# Patient Record
Sex: Male | Born: 1935
Health system: Southern US, Community
[De-identification: ages and names within clinical notes are randomized; demographics above are authoritative.]

## PROBLEM LIST (undated history)

## (undated) DIAGNOSIS — F5104 Psychophysiologic insomnia: Secondary | ICD-10-CM

## (undated) DIAGNOSIS — K219 Gastro-esophageal reflux disease without esophagitis: Secondary | ICD-10-CM

## (undated) DIAGNOSIS — I495 Sick sinus syndrome: Secondary | ICD-10-CM

## (undated) DIAGNOSIS — I10 Essential (primary) hypertension: Secondary | ICD-10-CM

## (undated) DIAGNOSIS — S2249XA Multiple fractures of ribs, unspecified side, initial encounter for closed fracture: Secondary | ICD-10-CM

## (undated) DIAGNOSIS — I2510 Atherosclerotic heart disease of native coronary artery without angina pectoris: Secondary | ICD-10-CM

## (undated) DIAGNOSIS — A078 Other specified protozoal intestinal diseases: Secondary | ICD-10-CM

## (undated) DIAGNOSIS — N281 Cyst of kidney, acquired: Secondary | ICD-10-CM

## (undated) DIAGNOSIS — I447 Left bundle-branch block, unspecified: Secondary | ICD-10-CM

## (undated) DIAGNOSIS — C4490 Unspecified malignant neoplasm of skin, unspecified: Secondary | ICD-10-CM

## (undated) DIAGNOSIS — K5792 Diverticulitis of intestine, part unspecified, without perforation or abscess without bleeding: Secondary | ICD-10-CM

## (undated) DIAGNOSIS — Z8679 Personal history of other diseases of the circulatory system: Secondary | ICD-10-CM

## (undated) DIAGNOSIS — E7800 Pure hypercholesterolemia, unspecified: Secondary | ICD-10-CM

## (undated) DIAGNOSIS — N200 Calculus of kidney: Secondary | ICD-10-CM

## (undated) DIAGNOSIS — N529 Male erectile dysfunction, unspecified: Secondary | ICD-10-CM

## (undated) HISTORY — DX: Gastro-esophageal reflux disease without esophagitis: K21.9

## (undated) HISTORY — DX: Male erectile dysfunction, unspecified: N52.9

## (undated) HISTORY — PX: STONE EXTRACTION WITH BASKET: SHX5318

## (undated) HISTORY — DX: Pure hypercholesterolemia, unspecified: E78.00

## (undated) HISTORY — DX: Atherosclerotic heart disease of native coronary artery without angina pectoris: I25.10

## (undated) HISTORY — DX: Multiple fractures of ribs, unspecified side, initial encounter for closed fracture: S22.49XA

## (undated) HISTORY — DX: Left bundle-branch block, unspecified: I44.7

## (undated) HISTORY — DX: Calculus of kidney: N20.0

## (undated) HISTORY — DX: Personal history of other diseases of the circulatory system: Z86.79

## (undated) HISTORY — DX: Cyst of kidney, acquired: N28.1

## (undated) HISTORY — DX: Diverticulitis of intestine, part unspecified, without perforation or abscess without bleeding: K57.92

## (undated) HISTORY — DX: Essential (primary) hypertension: I10

## (undated) HISTORY — DX: Other specified protozoal intestinal diseases: A07.8

## (undated) HISTORY — DX: Psychophysiologic insomnia: F51.04

## (undated) HISTORY — DX: Sick sinus syndrome: I49.5

## (undated) HISTORY — DX: Unspecified malignant neoplasm of skin, unspecified: C44.90

## (undated) HISTORY — PX: PROSTATE BIOPSY: SHX241

---

## 2000-07-25 HISTORY — PX: CORONARY ANGIOPLASTY WITH STENT PLACEMENT: SHX49

## 2000-08-11 ENCOUNTER — Inpatient Hospital Stay (HOSPITAL_COMMUNITY): Admission: EM | Admit: 2000-08-11 | Discharge: 2000-08-13 | Payer: Self-pay | Admitting: Emergency Medicine

## 2000-08-11 ENCOUNTER — Encounter: Payer: Self-pay | Admitting: Emergency Medicine

## 2000-08-15 ENCOUNTER — Encounter (HOSPITAL_COMMUNITY): Admission: RE | Admit: 2000-08-15 | Discharge: 2000-11-13 | Payer: Self-pay | Admitting: Interventional Cardiology

## 2000-10-31 ENCOUNTER — Encounter: Payer: Self-pay | Admitting: Gastroenterology

## 2000-10-31 ENCOUNTER — Encounter: Admission: RE | Admit: 2000-10-31 | Discharge: 2000-10-31 | Payer: Self-pay | Admitting: Gastroenterology

## 2000-11-14 ENCOUNTER — Encounter (HOSPITAL_COMMUNITY): Admission: RE | Admit: 2000-11-14 | Discharge: 2000-11-22 | Payer: Self-pay | Admitting: Interventional Cardiology

## 2000-11-16 ENCOUNTER — Ambulatory Visit (HOSPITAL_COMMUNITY): Admission: RE | Admit: 2000-11-16 | Discharge: 2000-11-16 | Payer: Self-pay | Admitting: Gastroenterology

## 2001-02-01 ENCOUNTER — Ambulatory Visit (HOSPITAL_COMMUNITY): Admission: RE | Admit: 2001-02-01 | Discharge: 2001-02-01 | Payer: Self-pay | Admitting: Interventional Cardiology

## 2003-10-22 ENCOUNTER — Encounter: Admission: RE | Admit: 2003-10-22 | Discharge: 2003-10-22 | Payer: Self-pay | Admitting: Internal Medicine

## 2003-12-11 ENCOUNTER — Ambulatory Visit (HOSPITAL_COMMUNITY): Admission: RE | Admit: 2003-12-11 | Discharge: 2003-12-11 | Payer: Self-pay | Admitting: Gastroenterology

## 2006-04-13 ENCOUNTER — Inpatient Hospital Stay (HOSPITAL_BASED_OUTPATIENT_CLINIC_OR_DEPARTMENT_OTHER): Admission: RE | Admit: 2006-04-13 | Discharge: 2006-04-13 | Payer: Self-pay | Admitting: Interventional Cardiology

## 2006-04-14 ENCOUNTER — Ambulatory Visit (HOSPITAL_COMMUNITY): Admission: RE | Admit: 2006-04-14 | Discharge: 2006-04-15 | Payer: Self-pay | Admitting: Interventional Cardiology

## 2006-04-14 HISTORY — PX: CORONARY ANGIOPLASTY WITH STENT PLACEMENT: SHX49

## 2006-12-14 ENCOUNTER — Encounter: Admission: RE | Admit: 2006-12-14 | Discharge: 2006-12-14 | Payer: Self-pay | Admitting: Internal Medicine

## 2010-09-22 ENCOUNTER — Inpatient Hospital Stay (HOSPITAL_COMMUNITY): Admission: EM | Admit: 2010-09-22 | Discharge: 2010-09-30 | Payer: Self-pay | Source: Home / Self Care

## 2010-09-24 ENCOUNTER — Encounter (INDEPENDENT_AMBULATORY_CARE_PROVIDER_SITE_OTHER): Payer: Self-pay | Admitting: Interventional Cardiology

## 2010-09-25 DIAGNOSIS — S2249XA Multiple fractures of ribs, unspecified side, initial encounter for closed fracture: Secondary | ICD-10-CM

## 2010-09-25 HISTORY — PX: OTHER SURGICAL HISTORY: SHX169

## 2010-09-25 HISTORY — DX: Multiple fractures of ribs, unspecified side, initial encounter for closed fracture: S22.49XA

## 2010-10-06 ENCOUNTER — Ambulatory Visit: Payer: Self-pay | Admitting: Thoracic Surgery

## 2010-10-06 ENCOUNTER — Encounter
Admission: RE | Admit: 2010-10-06 | Discharge: 2010-10-06 | Payer: Self-pay | Source: Home / Self Care | Attending: Thoracic Surgery | Admitting: Thoracic Surgery

## 2010-10-20 ENCOUNTER — Ambulatory Visit
Admission: RE | Admit: 2010-10-20 | Discharge: 2010-10-20 | Payer: Self-pay | Source: Home / Self Care | Attending: Thoracic Surgery | Admitting: Thoracic Surgery

## 2010-10-20 ENCOUNTER — Encounter
Admission: RE | Admit: 2010-10-20 | Discharge: 2010-10-20 | Payer: Self-pay | Source: Home / Self Care | Attending: Thoracic Surgery | Admitting: Thoracic Surgery

## 2010-12-20 ENCOUNTER — Other Ambulatory Visit: Payer: Self-pay | Admitting: Thoracic Surgery

## 2010-12-20 DIAGNOSIS — S2249XA Multiple fractures of ribs, unspecified side, initial encounter for closed fracture: Secondary | ICD-10-CM

## 2010-12-21 ENCOUNTER — Ambulatory Visit
Admission: RE | Admit: 2010-12-21 | Discharge: 2010-12-21 | Disposition: A | Discharge status: Home / Self Care | Payer: Medicare Other | Source: Ambulatory Visit | Attending: Thoracic Surgery | Admitting: Thoracic Surgery

## 2010-12-21 ENCOUNTER — Ambulatory Visit (INDEPENDENT_AMBULATORY_CARE_PROVIDER_SITE_OTHER): Payer: Self-pay | Admitting: Thoracic Surgery

## 2010-12-21 DIAGNOSIS — S2249XA Multiple fractures of ribs, unspecified side, initial encounter for closed fracture: Secondary | ICD-10-CM

## 2010-12-22 NOTE — Assessment & Plan Note (Signed)
OFFICE VISIT  AMOS, MICHEALS DOB:  22-Aug-1926                                        December 21, 2010 CHART #:  [id number]  The patient came today and has continued to make some improvement.  He still is hurting some on the right side but his chest x-ray shows that the rib plates are in good position and he has good chest wall stability.  His lungs are clear to auscultation and percussion.  I told him to gradually increase his activities.  We will check him again in 3 months with chest x-ray.  Ines Bloomer, M.D. Electronically Signed  DPB/MEDQ  D:  12/21/2010  T:  12/21/2010  Job:  [id number]

## 2010-12-27 LAB — BASIC METABOLIC PANEL
BUN: 17 mg/dL (ref 6–23)
CO2: 22 mEq/L (ref 19–32)
CO2: 26 mEq/L (ref 19–32)
CO2: 27 mEq/L (ref 19–32)
Calcium: 8.3 mg/dL — ABNORMAL LOW (ref 8.4–10.5)
Chloride: 106 mEq/L (ref 96–112)
Chloride: 98 mEq/L (ref 96–112)
Chloride: 99 mEq/L (ref 96–112)
Chloride: 99 mEq/L (ref 96–112)
Creatinine, Ser: 0.88 mg/dL (ref 0.4–1.5)
Creatinine, Ser: 1.1 mg/dL (ref 0.4–1.5)
GFR calc Af Amer: >60 mL/min (ref >60)
GFR calc Af Amer: >60 mL/min (ref >60)
GFR calc Af Amer: >60 mL/min (ref >60)
Glucose, Bld: 119 mg/dL — ABNORMAL HIGH (ref 70–99)
Glucose, Bld: 119 mg/dL — ABNORMAL HIGH (ref 70–99)
Glucose, Bld: 126 mg/dL — ABNORMAL HIGH (ref 70–99)
Potassium: 3.7 mEq/L (ref 3.5–5.1)
Potassium: 4 mEq/L (ref 3.5–5.1)
Sodium: 130 mEq/L — ABNORMAL LOW (ref 135–145)

## 2010-12-27 LAB — CBC
HCT: 25.7 % — ABNORMAL LOW (ref 39.0–52.0)
HCT: 25.9 % — ABNORMAL LOW (ref 39.0–52.0)
HCT: 26 % — ABNORMAL LOW (ref 39.0–52.0)
HCT: 27.8 % — ABNORMAL LOW (ref 39.0–52.0)
HCT: 30.2 % — ABNORMAL LOW (ref 39.0–52.0)
HCT: 34.4 % — ABNORMAL LOW (ref 39.0–52.0)
HCT: 39.6 % (ref 39.0–52.0)
Hemoglobin: 10 g/dL — ABNORMAL LOW (ref 13.0–17.0)
Hemoglobin: 11.2 g/dL — ABNORMAL LOW (ref 13.0–17.0)
Hemoglobin: 8.2 g/dL — ABNORMAL LOW (ref 13.0–17.0)
Hemoglobin: 9 g/dL — ABNORMAL LOW (ref 13.0–17.0)
MCH: 31 pg (ref 26.0–34.0)
MCH: 31 pg (ref 26.0–34.0)
MCH: 31.1 pg (ref 26.0–34.0)
MCH: 31.3 pg (ref 26.0–34.0)
MCH: 31.4 pg (ref 26.0–34.0)
MCH: 31.9 pg (ref 26.0–34.0)
MCHC: 32.4 g/dL (ref 30.0–36.0)
MCHC: 32.6 g/dL (ref 30.0–36.0)
MCHC: 33.1 g/dL (ref 30.0–36.0)
MCHC: 33.3 g/dL (ref 30.0–36.0)
MCHC: 33.8 g/dL (ref 30.0–36.0)
MCHC: 34.5 g/dL (ref 30.0–36.0)
MCV: 89.7 fL (ref 78.0–100.0)
MCV: 90 fL (ref 78.0–100.0)
MCV: 91.1 fL (ref 78.0–100.0)
MCV: 91.2 fL (ref 78.0–100.0)
MCV: 91.9 fL (ref 78.0–100.0)
MCV: 93 fL (ref 78.0–100.0)
MCV: 93.4 fL (ref 78.0–100.0)
MCV: 94.4 fL (ref 78.0–100.0)
MCV: 95.8 fL (ref 78.0–100.0)
Platelets: 142 10*3/uL — ABNORMAL LOW (ref 150–400)
Platelets: 147 10*3/uL — ABNORMAL LOW (ref 150–400)
Platelets: 175 10*3/uL (ref 150–400)
Platelets: 188 10*3/uL (ref 150–400)
Platelets: 292 10*3/uL (ref 150–400)
RBC: 2.61 MIL/uL — ABNORMAL LOW (ref 4.22–5.81)
RBC: 2.61 MIL/uL — ABNORMAL LOW (ref 4.22–5.81)
RBC: 2.82 MIL/uL — ABNORMAL LOW (ref 4.22–5.81)
RBC: 2.83 MIL/uL — ABNORMAL LOW (ref 4.22–5.81)
RBC: 3.2 MIL/uL — ABNORMAL LOW (ref 4.22–5.81)
RBC: 3.58 MIL/uL — ABNORMAL LOW (ref 4.22–5.81)
RBC: 3.63 MIL/uL — ABNORMAL LOW (ref 4.22–5.81)
RDW: 12.9 % (ref 11.5–15.5)
RDW: 13.1 % (ref 11.5–15.5)
RDW: 13.1 % (ref 11.5–15.5)
WBC: 11.1 10*3/uL — ABNORMAL HIGH (ref 4.0–10.5)
WBC: 12.6 10*3/uL — ABNORMAL HIGH (ref 4.0–10.5)
WBC: 14.8 10*3/uL — ABNORMAL HIGH (ref 4.0–10.5)
WBC: 17 10*3/uL — ABNORMAL HIGH (ref 4.0–10.5)

## 2010-12-27 LAB — COMPREHENSIVE METABOLIC PANEL
ALT: 150 U/L — ABNORMAL HIGH (ref 0–53)
ALT: 209 U/L — ABNORMAL HIGH (ref 0–53)
AST: 180 U/L — ABNORMAL HIGH (ref 0–37)
AST: 291 U/L — ABNORMAL HIGH (ref 0–37)
Albumin: 2.6 g/dL — ABNORMAL LOW (ref 3.5–5.2)
Albumin: 2.7 g/dL — ABNORMAL LOW (ref 3.5–5.2)
Albumin: 3.7 g/dL (ref 3.5–5.2)
Alkaline Phosphatase: 23 U/L — ABNORMAL LOW (ref 39–117)
BUN: 13 mg/dL (ref 6–23)
BUN: 18 mg/dL (ref 6–23)
CO2: 24 mEq/L (ref 19–32)
CO2: 24 mEq/L (ref 19–32)
CO2: 29 mEq/L (ref 19–32)
Calcium: 8 mg/dL — ABNORMAL LOW (ref 8.4–10.5)
Calcium: 8.9 mg/dL (ref 8.4–10.5)
Chloride: 107 mEq/L (ref 96–112)
Chloride: 111 mEq/L (ref 96–112)
Chloride: 96 mEq/L (ref 96–112)
Creatinine, Ser: 0.89 mg/dL (ref 0.4–1.5)
Creatinine, Ser: 1.29 mg/dL (ref 0.4–1.5)
GFR calc Af Amer: 56 mL/min — ABNORMAL LOW (ref >60)
GFR calc Af Amer: >60 mL/min (ref >60)
GFR calc non Af Amer: 46 mL/min — ABNORMAL LOW (ref >60)
GFR calc non Af Amer: 53 mL/min — ABNORMAL LOW (ref >60)
GFR calc non Af Amer: >60 mL/min (ref >60)
Glucose, Bld: 101 mg/dL — ABNORMAL HIGH (ref 70–99)
Glucose, Bld: 117 mg/dL — ABNORMAL HIGH (ref 70–99)
Potassium: 4.2 mEq/L (ref 3.5–5.1)
Potassium: 5.2 mEq/L — ABNORMAL HIGH (ref 3.5–5.1)
Sodium: 141 mEq/L (ref 135–145)
Sodium: 141 mEq/L (ref 135–145)
Total Bilirubin: 0.6 mg/dL (ref 0.3–1.2)
Total Bilirubin: 0.7 mg/dL (ref 0.3–1.2)
Total Bilirubin: 1.2 mg/dL (ref 0.3–1.2)
Total Protein: 4.6 g/dL — ABNORMAL LOW (ref 6.0–8.3)

## 2010-12-27 LAB — URINE CULTURE: Culture  Setup Time: 201112120905

## 2010-12-27 LAB — AMYLASE: Amylase: 47 U/L (ref 0–105)

## 2010-12-27 LAB — TYPE AND SCREEN
ABO/RH(D): O POS
Antibody Screen: NEGATIVE
Unit division: 0

## 2010-12-27 LAB — POCT I-STAT, CHEM 8
BUN: 20 mg/dL (ref 6–23)
Chloride: 107 mEq/L (ref 96–112)
Creatinine, Ser: 1.4 mg/dL (ref 0.4–1.5)
Sodium: 141 mEq/L (ref 135–145)
TCO2: 24 mmol/L (ref 0–100)

## 2010-12-27 LAB — BLOOD GAS, ARTERIAL
Acid-Base Excess: 2.9 mmol/L — ABNORMAL HIGH (ref 0.0–2.0)
Bicarbonate: 26.4 mEq/L — ABNORMAL HIGH (ref 20.0–24.0)
Drawn by: 252031
O2 Saturation: 93.8 %
pO2, Arterial: 66.2 mmHg — ABNORMAL LOW (ref 80.0–100.0)

## 2010-12-27 LAB — CARDIAC PANEL(CRET KIN+CKTOT+MB+TROPI): Relative Index: 1.6 (ref 0.0–2.5)

## 2010-12-27 LAB — DIFFERENTIAL
Eosinophils Relative: 0 % (ref 0–5)
Lymphocytes Relative: 6 % — ABNORMAL LOW (ref 12–46)
Lymphs Abs: 0.8 10*3/uL (ref 0.7–4.0)
Monocytes Absolute: 0.9 10*3/uL (ref 0.1–1.0)
Monocytes Relative: 7 % (ref 3–12)

## 2010-12-27 LAB — MRSA PCR SCREENING
MRSA by PCR: NEGATIVE
MRSA by PCR: NEGATIVE

## 2010-12-27 LAB — PREPARE RBC (CROSSMATCH)

## 2011-03-01 NOTE — Letter (Signed)
October 20, 2010   Lyn Records, M.D.  [address]  [address]  [place] [address]   Re:  MARSHALL, KAMPF             DOB:  [birth year]   Dear Erskine Emery,   I saw the patient back today and his incisions are well healed and his x-  ray looks that his rib plating was in good position and no evidence of  recurrence of his hemothorax.  He will be seeing you today.  His blood  pressure was 159/84, pulse 79, respirations 20, and sats were 97%.  He  said that his blood pressure has been up recently and I said this  probably were just related that he is getting better and that you would  probably adjust his medications.  I gave him a refill for Ultram and I  will see him back again in 2 months with another chest x-ray and I have  released him to full activity.   Sincerely,   Ines Bloomer, M.D.  Electronically Signed   DPB/MEDQ  D:  10/20/2010  T:  10/21/2010  Job:  [id number]   cc:   Cherylynn Ridges, M.D.  Thora Lance, M.D.

## 2011-03-01 NOTE — Letter (Signed)
October 06, 2010   Gabrielle Dare. Janee Morn, MD  [facility]  [address] [place], [place] [address]   Re:  ARIEN, BENINCASA             DOB:  07-Jul-1926   Dear Laurell Josephs,   I saw the patient back today.  He is doing well after his open reduction  internal fixation of his fourth through seventh ribs.  His incision has  well-healed.  I removed his chest tube sutures.  His lungs are clear.  He is having some moderate pain, but this has improved and his shortness  of breath has improved.  His blood pressure is 108/63, pulse 62,  respirations 16, sats were 94%.   PLAN:  To see him back in 2 weeks.  I told him not to drive until we saw  him back in 2 weeks.   Ines Bloomer, M.D.  Electronically Signed   DPB/MEDQ  D:  10/06/2010  T:  10/06/2010  Job:  [id number]

## 2011-03-04 NOTE — Cardiovascular Report (Signed)
[place]  Patient:    Brandon Robles, Brandon Robles                            MRN: [id number] Proc. Date: 02/01/01 Adm. Date:  [id number] Disc. Date: [id number] Attending:  Orland Mustard CC:         Pearla Dubonnet, M.D.                        Cardiac Catheterization  INDICATIONS FOR PROCEDURE:  Abnormal Cardiolite, recent atypical chest symptoms including left arm discomfort in this patient who is status post acute inferior infarction with stenting of the right coronary in October of 2001.  This study is being done to rule out in-stent re-stenosis.  PROCEDURES PERFORMED: 1. Left heart catheterization. 2. Selective coronary angiography. 3. Left ventriculography.  DESCRIPTION OF PROCEDURE:  After informed consent, a 6 French sheath was inserted into the right femoral artery using a modified Seldinger technique. A 6 French A2 multipurpose catheter was used for hemodynamic recordings, left ventriculography, and selective left and right coronary angiography.  The patient tolerated the procedure without complications.  A sheathogram was performed in the right femoral/iliac system and demonstrated sheath entry near the bifurcation and therefore Perclose was not performed.  No complications occurred during the procedure.  RESULTS:  I:  HEMODYNAMIC DATA:     a. The aortic pressure was 125/61 mmHg.     b. Left ventricular pressure 123/13 mmHg.  II:  LEFT VENTRICULOGRAPHY:  The left ventricle is normal in size.  There appears to be mild anteroapical hypokinesis.  The ejection fraction is approximately 50%.  III:  SELECTIVE CORONARY ANGIOGRAPHY/FLUOROSCOPY:  Demonstrates spotty, heavy left anterior descending calcification as well as right coronary proximal calcification.     a. Left main:  The left main is free of significant obstruction.     b. Left anterior descending coronary artery:  The LAD is a large        vessel that wraps around the left  ventricular apex.  It gives        origin to three diagonal branches.  Heavy calcium is noted        proximally in the mid vessel.  There is an eccentric, somewhat        shelflike lesion in the proximal LAD before the first large        septal perforator.  This obstructs the vessel by up to 40%.  Luminal        irregularities are noted in the mid LAD beyond the second diagonal.        The LAD wraps around the apex and is free of other evidence of        obstruction.     c. Circumflex artery:  The circumflex gives origin to a small to moderate        sized ramus branch.  The ramus branch in its mid section contains an        eccentric 40-50% stenosis.     d. Right coronary artery:  The right coronary artery is a large vessel.        Cine fluoroscopy demonstrates the stent in the mid vessel.  Luminal        irregularities with up to 30% distal in-stent re-stenosis is noted.        No significant obstruction is noted.  The right coronary contains a  30% eccentric plaque beyond the first branch that arises on the        inferior wall.  A large PDA and two moderate sized left ventricular        branches arise distally and are free of any significant obstruction.  CONCLUSIONS: 1. Mild to moderate coronary disease with 40% eccentric left anterior    descending, 50% ramus branch, and 30% distal in-stent re-stenosis    in the mid right coronary. 2. Mild anteroapical hypokinesis with ejection fraction of 50%. 3. Abnormal Cardiolite demonstrating evidence of inferior infarction with    peri-infarct ischemia.  This will probably be the appearance of    the patients Cardiolite study.  There are certainly no obstructive    lesion to account for any evidence of ischemia.  The findings likely    represent residual infarcted tissue.  PLAN:  Continue medical therapy. DD:  02/01/01 TD:  02/02/01 Job: [id number]

## 2011-03-04 NOTE — Discharge Summary (Signed)
[place]  Patient:    Brandon Robles, [name]                            MRN: [id number] Adm. Date:  [id number] Disc. Date: 08/13/00 Attending:  Rudean Hitt CC:         Celso Sickle, M.D.  [place] Phase 2  Cardiac Rehabilitation Program   Discharge Summary  FINAL DIAGNOSES: 1. Chest and epigastric pain, myocardial infarction ruled out. 2. Gastroenteritis, resolved. 3. Recent inferior wall myocardial infarction on July 25, 2000, with persistent electrocardiographic changes. 4. Labile hypertension. 5. Anemia. 6. Chronic anxiety and sleep disturbance.  HISTORY OF PRESENT ILLNESS:  This is a 77 year old Caucasian gentleman admitted with chest discomfort.  He had been hospitalized on July 25, 2000, in [place] for an acute inferior wall myocardial infarction.  He was air lifted from [place], [place], to [place], [place], where he underwent acute stent to his right coronary artery.  He was discharged home two days later on Plavix 75 mg daily, atenolol 50 mg daily, Ambien 10 mg h.s., and Ecotrin 325 mg a day.  He was to have started cardiac rehabilitation on August 16, 2000.  The patient was admitted on August 11, 2000, to [place] after experiencing of severe indigestion and subsequent diarrhea.  The pain was epigastric rather than truly precordial.  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  An EKG showed inferior ischemia T-wave changes and unfortunately no old EKGs were available to compare.  The patient denied any nausea or diaphoresis associated with the diarrhea.  PHYSICAL EXAMINATION:  The physical examination on admission showed a blood pressure of 123/59, a pulse of 60 and regular, and normal respirations.  HEENT:  Unremarkable.  NECK:  Jugular venous pressure normal.  Carotids normal.  CHEST:  Clear.  HEART:  No murmurs,  rubs, gallops, or clicks.  ABDOMEN:  Soft, nontender, and no masses.  Active bowel sounds.  EXTREMITIES:  Good pulses.  No edema.  HOSPITAL COURSE:  His EKG in the emergency room showed Q waves in 2, 3, and aVF with deeply inverted T-waves in 2, 3, and aVF consistent with an inferior wall myocardial infarction of uncertain age.  The chest x-ray was negative with no active disease.  The patient was admitted to telemetry.  Serial enzymes and EKGs were obtained.  The patient was initially treated with IV heparin and IV nitroglycerin and his Plavix was continued.  His cardiac enzymes came back negative x 3.  His initial hemoglobin was 15 and the following morning was 13.  Stool Hemoccults were requested, but are not reported back at the time of this dictation.  The hemoglobin remained stable at 13 on the second morning of his hospital stay.  His GI symptoms improved nicely on Prevacid.  Serial EKGs showed no change in the pattern of a recent inferior wall myocardial infarction with marked T-wave abnormalities.  His IV nitroglycerin and IV heparin were discontinued on August 12, 2000.  He was allowed to walk in the hall.  He had no further symptoms of any chest pain or dyspnea and was able to be discharged improved on August 13, 2000, a.m.  DISCHARGE MEDICATIONS: 1. Plavix 75 mg one daily. 2. Atenolol 50 mg, taking one half tablet twice a day. 3. Ambien 10 mg h.s. for sleep. 4. Coated  aspirin 325 mg daily. 5. Protonix 40 mg daily. 6. Nitrostat 1/150 p.r.n. 7. Generic Xanax 0.25 mg q.8h. p.r.n. for nerves.  DIET:  He will be on a low-cholesterol diet.  ACTIVITY:  He may drive a car and drive his tractor.  He is to proceed with starting cardiac rehabilitation later this week.  FOLLOW-UP:  He is to keep his appointment in November with Celso Sickle, M.D.  CONDITION ON DISCHARGE:  Improved. DD:  08/13/00 TD:  08/13/00 Job: [id number]

## 2011-03-04 NOTE — H&P (Signed)
[facility]  Patient:    Brandon Robles, Brandon Robles                            MRN: [id number] Adm. Date:  [id number] Disc. Date: [id number] Attending:  Orland Mustard Dictator:   Anselm Lis, N.P. CC:         Pearla Dubonnet, M.D.   History and Physical  PRIMARY CARE Lorian Yaun:  Dr. Pearla Dubonnet.  DATE OF BIRTH:  [birth year].  DATE OF PROCEDURE:  February 01, 2001.  DIAGNOSES: (As dictated by Dr. Darci Needle.) 1. Chest pain; typical/atypical features.    A. (January 11, 2001) Stress Cardiolite revealing ejection fraction of       48% with inferior infarction and peri-infarct ischemia.    B. (October 2001) Acute inferior myocardial infarction with subsequent       stent, right coronary artery, in [place], [place].    C. (Late in October 2001) [facility] admission with atypical chest       pain.  He ruled out for myocardial infarction and discomfort was thought       of a gastric etiology. 2. History of hiatal hernia diagnosed by esophagogastroduodenoscopy,    Dr. Fayrene Fearing L. Randa Evens, January 2002.  Had also undergone an abdominal    ultrasound revealing small gallbladder polyp, left liver calcified    granuloma and upper pole of the left kidney cyst. 3. History of kidney stones. 4. Labile hypertension. 5. Chronic anxiety/sleep disturbances with p.r.n. Ativan and Ambien.  PLAN:  (As dictated by Dr. Verdis Prime.)  Patient has been counseled to undergo and has accepted plans for coronary angiography with possible percutaneous intervention/brachytherapy (possible restenosis of RCA stent) if indicated and able.  Risks, potential complications, benefits and alternatives to the procedure were reviewed.  [name] and his family members indicate their questions and concerns are addressed and are agreeable to proceed.  HISTORY OF PRESENT ILLNESS:  [name] is a pleasant 77 year old gentleman with history of acute inferior myocardial  infarction, October of 2001, in [place], [place]; airlifted and had subsequent stent, RCA, in [place], [place].  Patient has been experiencing some typical/atypical features of chest pain.  A GI workup including EGD and abdominal ultrasound were significant only for hiatal hernia.  No improvement with proton pump inhibitors (Dr. Randa Evens).  Followup Cardiolite revealed evidence of an old inferior MI with peri-infarct ischemia.  He did have some ventricular ectopy (bigeminy) at peak exercise and early recovery.  Ejection fraction was 48%.  ALLERGIES:  No known drug allergies; okay with seafood, shellfish and iodinated products.  MEDICATIONS: A. Tenormin 50 mg one-half p.o. q.d. B. Xanax 0.25 mg p.r.n. up to t.i.d. C. Ambien 10 mg q.h.s. p.r.n. D. Enteric-coated aspirin 325 mg once daily (none for five days). E. Zantac p.r.n.  SOCIAL HISTORY AND HABITS:  Patient has been married for 29 years.  Has two children by a first marriage.  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 Tobacco:  Negative though did ______  until last year.  ETOH:  Negative.  FAMILY HISTORY:  Mother died at age 16 of stroke.  Father died at age 28, "hardening of the arteries."  One brother died at age 20 of MI; had hypertension.  A sister died at age 8 of myocardial infarction.  Brother died at age 20 of a heart attack; he had hypertension and diabetes.  REVIEW OF SYSTEMS:  As in HPI/previous medical history; otherwise, denies problems with fainting, light-headedness nor dizziness.  Problems with postnasal drip.  Negative melena nor bright red blood PR.  Some epigastric discomfort attributed to hiatal hernia.  Negative dysuria nor hematuria. Arthritic-type complaints affecting mostly his bilateral posterior shoulders and neck.  Denies pedal edema, orthopnea nor undue DOE.  PHYSICAL EXAMINATION:  (As performed by Dr. Verdis Prime.)  VITAL SIGNS:  Blood pressure 129/64, heart rate 48 and regular,  respiratory rate 16, temperature 96.7.  Height 5 feet 9 inches.  Weight 160 pounds.  GENERAL:  In general, he is a well-nourished, slender gentleman in no acute distress.  His family members are at bedside.  HEENT/NECK:  Brisk bilateral carotid upstrokes without bruit.  No significant JVD nor thyromegaly.  CARDIAC:  Regular rate and rhythm with distant heart sounds.  Normal S1 and S2.  No murmur, rub nor gallop appreciated.  CHEST:  Lung sounds clear with equal bilateral excursion.  Negative CVA tenderness.  ABDOMEN:  Soft, nondistended, normoactive bowel sounds.  Negative abdominal aortic, renal or femoral bruit.  Slight generalized discomfort to applied pressure.  EXTREMITIES:  Distal pulses intact; negative pedal edema.  NEUROLOGIC:  Cranial nerves II-XII grossly intact; alert and oriented x 3.  GENITAL AND RECTAL:  Deferred.  LABORATORY TESTS AND DATA:  Chest x-ray from October 2001 revealed mild aortic tortuosity, otherwise, no active disease.  Pro time 11, INR of 0.94, PTT of 26.  Sodium 142, potassium of 4.8, chloride 104, CO2 of 33, BUN of 21, creatinine 1.0, glucose 74.  Hemoglobin 15.4 with hematocrit of 44.2, WBC of 6.2 and platelets of 251,000.  LFTs within normal range.  EKG from January 11, 2001 revealed sinus bradycardia at 50 beats per minute with small Q waves inferiorly and T wave inversion, lead II. DD:  02/01/01 TD:  02/02/01 Job: [id number]

## 2011-03-04 NOTE — Cardiovascular Report (Signed)
Brandon Robles, Brandon Robles                   ACCOUNT NO.:  [id number]   MEDICAL RECORD NO.:  [id number]          PATIENT TYPE:  OIB   LOCATION:  1961                         FACILITY:  [facility]   PHYSICIAN:  Lyn Records, M.D.   DATE OF BIRTH:  20-Jan-1926   DATE OF PROCEDURE:  04/13/2006  DATE OF DISCHARGE:                              CARDIAC CATHETERIZATION   INDICATIONS:  The patient has had a previous inferior myocardial infarction  and recently had a stress Cardiolite that seemed to demonstrate the presence  of apical ischemia.  This study is being done to rule out progression of  disease in the LAD territory and possibly restenosis in right coronary  stent.   PROCEDURE PERFORMED:  1.  Left heart catheterization.  2.  Selective coronary angiography.  3.  Left ventriculography.   DESCRIPTION:  After informed consent, a 4-French sheath was placed in the  right femoral artery using the modified Seldinger technique.  The 4-French  B2 multipurpose catheter was used for hemodynamic recordings, left  ventriculography by hand injection, and selective right coronary  angiography.  A #4 Left Judkins catheter was used for left coronary  angiography.  The patient tolerated the procedure without complications.   RESULTS:  1.  Hemodynamic data:      1.  Aortic pressure 152/72.      2.  Left ventricular pressure 157/21 mmHg.  2.  Left ventriculography:  The left ventricular cavity size is normal.      Overall left ventricular function is normal.  Ejection fraction is 60%.      There may be a suggestion of mild inferior and mild mid anterior wall      hypokinesis.  3.  Coronary angiography:  Cinefluoroscopy demonstrates left axis deviation      and right coronary calcification.      1.  Left main coronary:  Widely patent.      2.  Left anterior descending coronary:  The left anterior descending is          large vessel that wraps around the left ventricular apex.  It gives          origin to a  single large diagonal branch.  In the proximal/mid          vessel region before the diagonal and after the first septal          perforator, there is an eccentric 90+ percent stenosis.  The left          anterior descending in the mid vessel beyond this contains a 50-60%          stenosis after the second diagonal.  The left anterior descending          wraps around the left ventricular apex.      3.  Circumflex artery:  Circumflex artery gives origin to a proximally          arising first obtuse marginal or ramus intermedius branch and the          remainder of the circumflex is relatively small and  bifurcates into          two smaller obtuse marginals.  The circumflex is not the dominant          vessel.      4.  Right coronary:  The right coronary artery contains heavy mid vessel          calcification within the region of previous stenting.  Plaque up to          50% in the proximal vessel was noted proximal to the stents.  There          is also 40% narrowing beyond the stents.  Irregularities noted in          the distal right coronary.  The RCA is large and gives origin to PDA          and two large left ventricular branches.   CONCLUSION:  1.  The patient has significant proximal/mid left anterior descending      disease and a region of heavy calcification.  The circumflex and right      coronary were free of high-grade obstruction, although there is plaquing      within the right coronary.  The right coronary is dominant.  2.  Overall normal left ventricular function.   PLAN:  Start Plavix, nitroglycerin for chest pain, PCI of the left anterior  descending within the next 72-96 hours, depending upon schedule. The patient  should return to the hospital if he has any chest discomfort at rest.      Lyn Records, M.D.  Electronically Signed     HWS/MEDQ  D:  04/13/2006  T:  04/13/2006  Job:  [id number]   cc:   Thora Lance, M.D.  Fax: [phone]

## 2011-03-04 NOTE — Op Note (Signed)
NAMEEASTIN, SWING                             ACCOUNT NO.:  [id number]   MEDICAL RECORD NO.:  [id number]                   PATIENT TYPE:  AMB   LOCATION:  ENDO                                 [facility]ACILITY[facility]  [facility]   PHYSICIAN:  Danise Edge, M.D.                DATE OF BIRTH:  10-31-1925   DATE OF PROCEDURE:  DATE OF DISCHARGE:                                 OPERATIVE REPORT   PROCEDURE INDICATIONS:  Mr. Avraj Lindroth is a 77 year old male born 1926-05-16.  Mr. Tukes is scheduled to undergo his first screening colonoscopy  with polypectomy to prevent colon cancer.   ENDOSCOPIST:  Danise Edge, M.D.   PREMEDICATION:  Versed 5 mg, Demerol 50 mg.   PROCEDURE:  After obtaining informed consent, Mr. Sturtevant was placed in the  left lateral decubitus position.  I administered intravenous Demerol and  intravenous Versed to achieve conscious sedation for the procedure.  Patient's blood pressure, oxygen saturation, and cardiac rhythm were  monitored throughout the procedure and documented in the medical record.   Anal inspection was normal.  Digital rectal exam was normal.  The prostate  was nonnodular.  The Olympus adjustable pediatric colonoscope was then  introduced into the rectum and advanced to the cecum.  Colonic preparation  for the exam today was excellent.  Rectum was normal.  Sigmoid colon and  descending colon normal.  Splenic flexure normal.  Transverse colon normal.  Hepatic flexure normal.  Ascending colon normal.  Cecum and ileocecal valve  normal.   ASSESSMENT:  Normal screening proctocolonoscopy of the cecum.  No endoscopic  evidence or presence of colorectal neoplasia.                                               Danise Edge, M.D.    MJ/MEDQ  D:  12/11/2003  T:  12/11/2003  Job:  [id number]   cc:   Thora Lance, M.D.  [address]  [place]  [place] [address]  Fax: [phone]

## 2011-03-04 NOTE — H&P (Signed)
[facility]  Patient:    Brandon Robles, Brandon Robles                            MRN: [id number] Adm. Date:  [id number] Disc. Date: [id number] Attending:  Rudean Hitt CC:         Darci Needle, M.D.                         History and Physical  HISTORY OF PRESENT ILLNESS:  This is a 77 year old Caucasian gentleman admitted through the emergency room with chest and abdominal discomfort.  He does not have any prior history of known hypertension or hypercholesterolemia. He is a nonsmoker and is not diabetic.  He does have known ischemic heart disease, however.  He was in good health with no known heart trouble until July 25, 2000, when while vacationing at [place], [place], he developed severe precordial chest pain with radiation down both arms associated with pallor, diaphoresis, and nausea and was found to have an acute inferior wall MI.  He was initially treated at [facility] and then was airlifted to a hospital in [place], where he underwent acute cardiac catheterization with stenting of his right coronary artery.  He was discharged home two days later on Plavix 75 mg daily, atenolol 50 mg daily, Ambien 10 mg daily, and aspirin 325 mg daily.  Since returning to [facility], he has seen Dr. Garnette Scheuermann one time about a week ago, although, Dr. Katrinka Blazing at that time had not received any records from [place].  The patient was scheduled to start phase II cardiac rehabilitation next week.  Today, the patient had onset of what he describes as acute burning indigestion with subsequent diarrhea x 5. He went to the rescue squad where blood pressure was noted to be 196/96 and his EKG showed changes of an inferior wall MI of uncertain age.  Because of the symptoms and abnormal EKG with no prior EKG to compare, he was brought to the emergency room where he was evaluated and admitted.  He has not had any nausea or diaphoresis.  At the present time,  he is essentially pain-free.  FAMILY HISTORY:  The patients mother died at age 88 of a stroke.  Father died at age 19 of hardening of the arteries.  The patient has one brother who died at age 48 of an MI and high blood pressure.  Another sister died at age 81 of an MI.  A brother died at age 26 of an MI, blood pressure, and diabetes.  SOCIAL HISTORY:  The patient has been married this time for 29 years.  He has two children by his first wife and they live nearby.  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  He does not smoke.  He used to chew on cigars, but quit after his heart attack several weeks ago.  He does not drink any alcohol.  PAST MEDICAL HISTORY:  He has had no surgery.  He has had a history of kidney stone.  ALLERGIES:  No known drug allergies.  REVIEW OF SYSTEMS:  He has had no recent GI symptoms until today when he began experiencing diarrhea x 5.  He denies any genitourinary or prostate problems. He denies any cough or respiratory symptoms.  Remainder of review of systems is unremarkable.  PHYSICAL EXAMINATION:  VITAL SIGNS:  Blood pressure 123/59, pulse  60 and regular, respirations are normal.  HEENT:  Unremarkable.  Jugular veinous pressure normal, carotids normal, and thyroid normal.  CHEST:  Clear to auscultation and percussion.  BACK:  Spine is normal.  HEART:  No murmur, gallop, rub, or click.  ABDOMEN:  Soft and nontender without evidence of organomegaly or masses.  EXTREMITIES:  Show good peripheral pulses with no edema and no phlebitis.  LABORATORY DATA:  Lab work is pending.  His chest x-ray shows no active disease.  His electrocardiography shows Q waves in 2, 3, aVF, with T wave inversion in 2, 3, and aVF consistent with an inferior wall MI of uncertain age, no old tracings are available.  IMPRESSION: 1. Chest pain, rule out myocardial infarction. 2. Coronary artery disease with recent inferior wall myocardial infarction,     treated with acute stent to the right coronary artery.  DISPOSITION:  Admit to telemetry.  Will get serial enzymes and EKGs.  Will treat with IV heparin, IV nitroglycerin, and continue Plavix and beta blocker and aspirin. DD:  08/11/00 TD:  08/12/00 Job: [id number]

## 2011-03-04 NOTE — Cardiovascular Report (Signed)
[name], SURGEON                   ACCOUNT NO.:  [id number]   MEDICAL RECORD NO.:  [id number]          PATIENT TYPE:  OIB   LOCATION:  2807                         FACILITY:  [facility]   PHYSICIAN:  Lyn Records, M.D.   DATE OF BIRTH:  [birth year]   DATE OF PROCEDURE:  04/14/2006  DATE OF DISCHARGE:                              CARDIAC CATHETERIZATION   PERCUTANEOUS CORONARY INTERVENTION PROCEDURE NOTE   INDICATIONS FOR PROCEDURE:  Patient has been having recurrent anginal  symptoms with some atypical features. A Cardiolite study demonstrated the  possibility of apical ischemia.  A subsequent diagnostic cath demonstrated a  high-grade proximal LAD stenosis.  The study is being done to stent the LAD.   PROCEDURE PERFORMED:  Bare metal stent implantation in the proximal LAD,  Liberte Boston scientific stent.   DESCRIPTION:  After informed consent, a 6-French sheath was started in the  right femoral artery, and a bolus, followed by an infusion, of Angiomax was  started.  ACT was documented to be greater than 350 seconds.  We used a  CLS3.5, 6-French guide catheter and an Clinical cytogeneticist guidewire.  We  performed angioplasty with a 2.5 x 12 Maverick balloon and then deployed a  3.5 x 16 Liberte stent, bare metal.  Peak pressure was 12 atmospheres.  We  post dilated with a 3.75 x 13 PowerSail balloon; 2 balloon inflations were  performed.  This stent was placed between a first septal perforator and a  moderate-sized second diagonal and second septal perforator.  There was mild  spasm in the ostium of the second diagonal, but flow was never lost.  The  stent does not completely cover the orifice of the diagonal.  TIMI grade III  flow was noted.  Less than 5% stenosis was noted in the region of  obstruction.  Angio-Seal was used post procedure to close the arteriotomy  site, and good hemostasis was achieved.   CONCLUSIONS:  Successful percutaneous transluminal coronary angioplasty and  stent  of the proximal left anterior descending coronary artery from 95% to  0% using a Liberte bare metal 3.5 x 16 stent with an excellent angiographic  result and TIMI grade III flow.   PLAN:  Aspirin and Plavix.  Plavix should be continued for a month.  Discharge April 15, 2006.      Lyn Records, M.D.  Electronically Signed     HWS/MEDQ  D:  04/14/2006  T:  04/14/2006  Job:  [id number]

## 2011-03-04 NOTE — Procedures (Signed)
[facility]  Patient:    Brandon Robles, Brandon Robles                            MRN: [id number] Proc. Date: 11/16/00 Adm. Date:  [id number] Attending:  Orland Mustard CC:         Thora Lance, M.D.   Procedure Report  PROCEDURE:  Esophagogastroduodenoscopy.  MEDICATIONS:  Cetacaine spray, fentanyl 62.5 mcg, Versed 6 mg IV.  INDICATIONS:  Persistent esophageal reflux symptoms, bloating.  INFORMED CONSENT:  The procedure had been explained to the patient and consent obtained.  DESCRIPTION OF PROCEDURE:  With the patient in the left lateral decubitus position, the Olympus video endoscope was inserted and advanced under direct visualization.  The stomach was entered, pylorus identified and passed.  The duodenum, including the bulb and second portion, were seen well and were unremarkable.  The pyloric channel was normal.  The scope withdrawn back into the stomach.  The antrum was normal as was the body of the stomach.  No ulceration or inflammation.  Fundus and cardia were seen well on retroflexed view and were normal.  There was a hiatal hernia with a patent GE junction. No gross esophagitis.  No evidence of Barretts, etc.  The proximal esophagus was seen well and was normal.  The patient tolerated the procedure well, maintained on low-flow oxygen and pulse oximetry throughout the procedure.  ASSESSMENT:  Hiatal hernia with patent GE junction.  PLAN:  We will continue the patient on Nexium and Reglan, given antireflux instructions, and see back in the office in 6-8 weeks. DD:  11/16/00 TD:  11/16/00 Job: [id number]

## 2011-03-22 ENCOUNTER — Other Ambulatory Visit: Payer: Self-pay | Admitting: Thoracic Surgery

## 2011-03-22 DIAGNOSIS — I2510 Atherosclerotic heart disease of native coronary artery without angina pectoris: Secondary | ICD-10-CM

## 2011-03-23 ENCOUNTER — Ambulatory Visit (INDEPENDENT_AMBULATORY_CARE_PROVIDER_SITE_OTHER): Payer: Medicare Other | Admitting: Thoracic Surgery

## 2011-03-23 ENCOUNTER — Ambulatory Visit
Admission: RE | Admit: 2011-03-23 | Discharge: 2011-03-23 | Disposition: A | Discharge status: Home / Self Care | Payer: Medicare Other | Source: Ambulatory Visit | Attending: Thoracic Surgery | Admitting: Thoracic Surgery

## 2011-03-23 ENCOUNTER — Ambulatory Visit: Payer: Medicare Other | Admitting: Thoracic Surgery

## 2011-03-23 DIAGNOSIS — S2249XA Multiple fractures of ribs, unspecified side, initial encounter for closed fracture: Secondary | ICD-10-CM

## 2011-03-23 DIAGNOSIS — I2510 Atherosclerotic heart disease of native coronary artery without angina pectoris: Secondary | ICD-10-CM

## 2011-03-24 NOTE — Assessment & Plan Note (Signed)
OFFICE VISIT  DAICHI, MORIS DOB:  September 27, 1926                                        March 23, 2011 CHART #:  [id number]  The patient's blood pressure was 112/68, pulse 58, respirations 16 and sats were 97%.  Lungs clear to auscultation and percussion.  He is back for riding this tractor.  His chest x-ray showed normal postoperative changes and his plates are in good position.  I will see him again in 6 months with a chest x-ray.  Ines Bloomer, M.D. Electronically Signed  DPB/MEDQ  D:  03/23/2011  T:  03/24/2011  Job:  [id number]

## 2011-09-15 ENCOUNTER — Encounter: Payer: Self-pay | Admitting: Thoracic Surgery

## 2011-09-15 DIAGNOSIS — K5792 Diverticulitis of intestine, part unspecified, without perforation or abscess without bleeding: Secondary | ICD-10-CM | POA: Insufficient documentation

## 2011-09-15 DIAGNOSIS — N200 Calculus of kidney: Secondary | ICD-10-CM | POA: Insufficient documentation

## 2011-09-15 DIAGNOSIS — I2510 Atherosclerotic heart disease of native coronary artery without angina pectoris: Secondary | ICD-10-CM | POA: Insufficient documentation

## 2011-09-15 DIAGNOSIS — N529 Male erectile dysfunction, unspecified: Secondary | ICD-10-CM | POA: Insufficient documentation

## 2011-09-15 DIAGNOSIS — K219 Gastro-esophageal reflux disease without esophagitis: Secondary | ICD-10-CM | POA: Insufficient documentation

## 2011-09-15 DIAGNOSIS — K649 Unspecified hemorrhoids: Secondary | ICD-10-CM | POA: Insufficient documentation

## 2011-09-15 DIAGNOSIS — E785 Hyperlipidemia, unspecified: Secondary | ICD-10-CM | POA: Insufficient documentation

## 2011-09-16 ENCOUNTER — Other Ambulatory Visit: Payer: Self-pay | Admitting: Thoracic Surgery

## 2011-09-16 DIAGNOSIS — S2249XA Multiple fractures of ribs, unspecified side, initial encounter for closed fracture: Secondary | ICD-10-CM

## 2011-09-21 ENCOUNTER — Encounter: Payer: Self-pay | Admitting: Thoracic Surgery

## 2011-09-21 ENCOUNTER — Ambulatory Visit (INDEPENDENT_AMBULATORY_CARE_PROVIDER_SITE_OTHER): Payer: Medicare Other | Admitting: Thoracic Surgery

## 2011-09-21 ENCOUNTER — Ambulatory Visit
Admission: RE | Admit: 2011-09-21 | Discharge: 2011-09-21 | Disposition: A | Discharge status: Home / Self Care | Payer: Medicare Other | Source: Ambulatory Visit | Attending: Thoracic Surgery | Admitting: Thoracic Surgery

## 2011-09-21 VITALS — BP 138/64 | HR 56 | Resp 18 | Ht 68.5 in | Wt 166.0 lb

## 2011-09-21 DIAGNOSIS — Z9889 Other specified postprocedural states: Secondary | ICD-10-CM

## 2011-09-21 DIAGNOSIS — S2249XB Multiple fractures of ribs, unspecified side, initial encounter for open fracture: Secondary | ICD-10-CM

## 2011-09-21 DIAGNOSIS — S2249XA Multiple fractures of ribs, unspecified side, initial encounter for closed fracture: Secondary | ICD-10-CM

## 2011-09-21 NOTE — Progress Notes (Signed)
HPI patient returns for followup one year after his injury. Chest x-ray shows that the for a rib plates on the right side are in good position and the fractures of healed. He has a occasional pain but is doing well overall. We'll refer him back to his medical Dr. for long-term followup.   Current Outpatient Prescriptions  Medication Sig Dispense Refill  . aspirin 325 MG tablet Take 325 mg by mouth daily.        [name] atenolol (TENORMIN) 25 MG tablet Take 25 mg by mouth daily.        [name] co-enzyme Q-10 30 MG capsule Take 30 mg by mouth 2 (two) times daily.       [name] lisinopril (PRINIVIL,ZESTRIL) 2.5 MG tablet Take 2.5 mg by mouth daily.        . Red Yeast Rice 600 MG CAPS Take by mouth.        . temazepam (RESTORIL) 30 MG capsule Take 30 mg by mouth at bedtime as needed.           Review of Systems: Unchanged   Physical Exam lungs are clear to auscultation and percussion   Diagnostic Tests: Chest x-ray showed the rib plates are in good position   Impression: Status post flail chest on the right secondary to tractor accident   Plan: Followup by PCP return when necessary

## 2013-06-24 ENCOUNTER — Encounter: Payer: Self-pay | Admitting: Internal Medicine

## 2013-07-03 ENCOUNTER — Other Ambulatory Visit: Payer: Self-pay | Admitting: Interventional Cardiology

## 2013-07-03 DIAGNOSIS — E7800 Pure hypercholesterolemia, unspecified: Secondary | ICD-10-CM

## 2013-07-17 ENCOUNTER — Encounter: Payer: Self-pay | Admitting: Internal Medicine

## 2013-07-18 ENCOUNTER — Ambulatory Visit (INDEPENDENT_AMBULATORY_CARE_PROVIDER_SITE_OTHER): Payer: Medicare Other | Admitting: Internal Medicine

## 2013-07-18 ENCOUNTER — Encounter: Payer: Self-pay | Admitting: Internal Medicine

## 2013-07-18 VITALS — BP 154/76 | HR 57 | Ht 68.5 in | Wt 167.0 lb

## 2013-07-18 DIAGNOSIS — R001 Bradycardia, unspecified: Secondary | ICD-10-CM

## 2013-07-18 DIAGNOSIS — I2510 Atherosclerotic heart disease of native coronary artery without angina pectoris: Secondary | ICD-10-CM

## 2013-07-18 DIAGNOSIS — I498 Other specified cardiac arrhythmias: Secondary | ICD-10-CM

## 2013-07-18 HISTORY — DX: Bradycardia, unspecified: R00.1

## 2013-07-18 NOTE — Assessment & Plan Note (Signed)
He denies anginal symptoms. We will have to stop his beta blocker because of bradycardia. He is instructed to call Dr. Michaelle Copas office if he were to experience recurrent chest pain.

## 2013-07-18 NOTE — Assessment & Plan Note (Signed)
The patient currently has asymptomatic bradycardia with transient heart rates in the 30's. He appears to be asymptomatic. He has had no syncope, near-syncope, or sudden drop attacks. I've recommended that the patient stop taking his atenolol. I've given him the warning signs about what to experience if he develops symptomatic bradycardia. I will see him back on an as-needed basis.

## 2013-07-18 NOTE — Progress Notes (Signed)
HPI Brandon Robles is referred today by Dr. Katrinka Blazing for consideration for a PPM. He is a pleasant 79 yo man with CAD, HTN, and bradycardia. He wore a cardiac monitor for 24 hours which demonstrated daytime bradycardia with heart rates transiently in the 30's. He has been asymptomatic. He denies chest pain, shortness of breath, syncope, or peripheral edema. 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 No dizzy spells or near-syncope.  Allergies  Allergen Reactions  . Ambien [Zolpidem Tartrate] Other (See Comments)    Confusion  . Honey Other (See Comments)    Pin and needles feeling     Current Outpatient Prescriptions  Medication Sig Dispense Refill  . aspirin 325 MG tablet Take one tablet once a day      . Cholecalciferol (VITAMIN D-3) 1000 UNITS CAPS Take by mouth daily.      . Coenzyme Q10 (COQ-10) 50 MG CAPS Take one capsule once daily      . fish oil-omega-3 fatty acids 1000 MG capsule Take one capsule once a day      . fluticasone (FLONASE) 50 MCG/ACT nasal spray Place 2 sprays into the nose daily.      [name] lisinopril-hydrochlorothiazide (PRINZIDE,ZESTORETIC) 10-12.5 MG per tablet Take one tablet once a day      . nitroGLYCERIN (NITROSTAT) 0.4 MG SL tablet Place 0.4 mg under the tongue every 5 (five) minutes as needed for chest pain. (MAXIMUM of 3 TABLETS)      . OVER THE COUNTER MEDICATION OTC muscadine plus      . Red Yeast Rice 600 MG CAPS Take two capsules once a day      . temazepam (RESTORIL) 30 MG capsule Take 30 mg by mouth at bedtime as needed.         No current facility-administered medications for this visit.     Past Medical History  Diagnosis Date  . Diverticulitis   . Hemorrhoids   . ED (erectile dysfunction)     Vacuum pump  . GERD (gastroesophageal reflux disease)   . Sinoatrial node dysfunction     EC Holter 24hr 06/24/13 showed sinus bradycardia with probable chronotropic incompetence. May need pacemaker per Dr. Verdis Prime.  . LBBB (left bundle branch block)   . Essential hypertension, benign     Stable, on lisinopril-HCTZ & Atenolol.  [name] CAD (coronary artery disease)     S/P inferior MI 07/2000 with RCA stenting at that time. In 03/2006 he had a LAD non-drug-eluting stent implanted.  . Coronary atherosclerosis of native coronary artery     Asymptomatic. On ASA 325mg .  . Pure hypercholesterolemia     On Red Yeast Rice & CoQ 10.  . Chronic insomnia   . H/O pericarditis   . Blastocystis hominis   . Multiple rib fractures 09/25/10    PTX 09/2010  . Nephrolithiasis     Renal stone basketing.  . Complex renal cyst     Left  . Skin cancer     Left base (Face) & (Ear in 12/2011).  . Acute MI, inferior wall 07/25/00    Followed by RCA stenting.     ROS:   All systems reviewed and negative except as noted in the HPI.   Past Surgical History  Procedure Laterality Date  . Right thoracotomy,open reduction and internal fixation of ribs 4-7  09/25/2010  . Stone extraction with basket      Renal  .  Prostate biopsy      Evidence  . Coronary angioplasty with stent placement  07/25/00    MI in 07/25/00 with RCA stenting at that time.  . Coronary angioplasty with stent placement  04/14/06    Bare metal stent in proximal LAD [facility] - 3.5 x16 stent) by Dr. Mendel Ryder.     Family History  Problem Relation Age of Onset  . CVA Mother   . Heart attack Father   . Kidney failure Father   . Heart disease Sister     Possible heart disease  . Aneurysm Sister   . Hypertension Brother   . Heart disease Brother   . Heart failure Brother   . Lung disease Brother   . Diabetes Brother      History   Social History  . Marital Status: Single    Spouse Name: N/A    Number of Children: N/A  . Years of Education: N/A   Occupational History  . Farming    Social History Main Topics  . Smoking status: Former Smoker    Types: Cigars  . Smokeless tobacco: Never Used     Comment: quit  1990  . Alcohol Use: Yes     Comment: NOT ON A REGULAR BASIS  . Drug Use: No  . Sexual Activity: Not on file   Other Topics Concern  . Not on file   Social History Narrative  . No narrative on file     BP 154/76  Pulse 57  Ht 5' 8.5" (1.74 m)  Wt 167 lb (75.751 kg)  BMI 25.02 kg/m2  Physical Exam:  Well appearing elderly man, NAD HEENT: Unremarkable Neck:  No JVD, no thyromegally Back:  No CVA tenderness Lungs:  Clear with no wheezes, rales or rhonchi HEART:  IRegular rate rhythm, no murmurs, no rubs, no clicks Abd:  soft, positive bowel sounds, no organomegally, no rebound, no guarding Ext:  2 plus pulses, no edema, no cyanosis, no clubbing Skin:  No rashes no nodules Neuro:  CN II through XII intact, motor grossly intact  EKG - sinus bradycardia with intermittent 2:1 AV block   Assess/Plan:

## 2013-07-18 NOTE — Patient Instructions (Addendum)
Your physician recommends that you schedule a follow-up appointment as needed   Your physician has recommended you make the following change in your medication:  1)Stop Atenolol   If BP goes up let Dr Katrinka Blazing know

## 2014-06-27 ENCOUNTER — Other Ambulatory Visit: Payer: Medicare Other

## 2014-06-30 ENCOUNTER — Other Ambulatory Visit: Payer: Medicare Other

## 2014-06-30 ENCOUNTER — Encounter: Payer: Self-pay | Admitting: Interventional Cardiology

## 2014-06-30 ENCOUNTER — Ambulatory Visit (INDEPENDENT_AMBULATORY_CARE_PROVIDER_SITE_OTHER): Payer: Medicare Other | Admitting: Interventional Cardiology

## 2014-06-30 VITALS — BP 130/78 | HR 63 | Ht 68.0 in | Wt 163.0 lb

## 2014-06-30 DIAGNOSIS — R001 Bradycardia, unspecified: Secondary | ICD-10-CM

## 2014-06-30 DIAGNOSIS — E785 Hyperlipidemia, unspecified: Secondary | ICD-10-CM

## 2014-06-30 DIAGNOSIS — I25810 Atherosclerosis of coronary artery bypass graft(s) without angina pectoris: Secondary | ICD-10-CM

## 2014-06-30 DIAGNOSIS — I498 Other specified cardiac arrhythmias: Secondary | ICD-10-CM

## 2014-06-30 LAB — LIPID PANEL
CHOL/HDL RATIO: 3
Cholesterol: 140 mg/dL (ref 0–200)
HDL: 45.8 mg/dL (ref >39.00)
LDL CALC: 85 mg/dL (ref 0–99)
NonHDL: 94.2
Triglycerides: 48 mg/dL (ref 0.0–149.0)
VLDL: 9.6 mg/dL (ref 0.0–40.0)

## 2014-06-30 NOTE — Patient Instructions (Signed)
Your physician recommends that you continue on your current medications as directed. Please refer to the Current Medication list given to you today.  Your physician wants you to follow-up in: 1 year with Dr.Barton You will receive a reminder letter in the mail two months in advance. If you don't receive a letter, please call our office to schedule the follow-up appointment.  

## 2014-06-30 NOTE — Progress Notes (Signed)
Patient ID: Brandon Robles, male   DOB: 1926/01/10, 80 y.o.   MRN: [id number] Medical History  coronary artery disease, status post inferior MI 07/2000, with RCA stenting at that time. LAD non-drug-eluting stent implantation, June, 2007, [place]   sinoatrial dysfunction, Lovena Le eval   Hypertension   chronic kidney disease stage III   Hyperlipidemia, prefers natural therapy   Gastroesophageal reflux disease   Chronic insomnia   History pericarditis   Nasal congestion   Left complex renal cyst   Nephrolithiasis   Diverticulosis   Hemorrhoids   History of diarrhea, blastocystis hominis   Skin cancer, left base   Erectile dysfunction, vacuum pump   multiple r rib fractures,PTX, 12/11      [address] [place], [place]  [address] Phone: [phone] Fax:  [phone]  Date:  06/30/2014   ID:  Brandon Robles, DOB 03/05/1926, MRN [id number]  PCP:  Irven Shelling, MD   ASSESSMENT:  1. Coronary artery disease, asymptomatic 2. Hypertension, controlled, controlled  3. Sinoatrial node dysfunction, stable  PLAN:  1. Continued physical activity 2. Blood work to assess lipids today 3. Clinical followup in one year   SUBJECTIVE: Brandon Robles is a 80 y.o. male who is remaining active. He works on his arm. He has not had syncope or angina. He denies dyspnea.   Wt Readings from Last 3 Encounters:  06/30/14 163 lb (73.936 kg)  07/18/13 167 lb (75.751 kg)  09/21/11 166 lb (75.297 kg)     Past Medical History  Diagnosis Date  . Diverticulitis   . Hemorrhoids   . ED (erectile dysfunction)     Vacuum pump  . GERD (gastroesophageal reflux disease)   . Sinoatrial node dysfunction     EC Holter 24hr 06/24/13 showed sinus bradycardia with probable chronotropic incompetence. May need pacemaker per Dr. Daneen Schick.  . LBBB (left bundle branch block)   . Essential hypertension, benign     Stable, on lisinopril-HCTZ & Atenolol.  [name] CAD (coronary artery  disease)     S/P inferior MI 07/2000 with RCA stenting at that time. In 03/2006 he had a LAD non-drug-eluting stent implanted.  . Coronary atherosclerosis of native coronary artery     Asymptomatic. On ASA 325mg .  . Pure hypercholesterolemia     On Red Yeast Rice & CoQ 10.  . Chronic insomnia   . H/O pericarditis   . Blastocystis hominis   . Multiple rib fractures 09/25/10    PTX 09/2010  . Nephrolithiasis     Renal stone basketing.  . Complex renal cyst     Left  . Skin cancer     Left base (Face) & (Ear in 12/2011).  . Acute MI, inferior wall 07/25/00    Followed by RCA stenting.     Current Outpatient Prescriptions  Medication Sig Dispense Refill  . aspirin 325 MG tablet Take one tablet once a day      . Cholecalciferol (VITAMIN D-3) 1000 UNITS CAPS Take by mouth daily.      . Coenzyme Q10 (COQ-10) 50 MG CAPS Take one capsule once daily      . fish oil-omega-3 fatty acids 1000 MG capsule Take one capsule once a day      . fluticasone (FLONASE) 50 MCG/ACT nasal spray Place 2 sprays into the nose daily.      [name] lisinopril-hydrochlorothiazide (PRINZIDE,ZESTORETIC) 10-12.5 MG per tablet Take one tablet once a day      . nitroGLYCERIN (  NITROSTAT) 0.4 MG SL tablet Place 0.4 mg under the tongue every 5 (five) minutes as needed for chest pain. (MAXIMUM of 3 TABLETS)      . OVER THE COUNTER MEDICATION OTC muscadine plus      . Red Yeast Rice 600 MG CAPS Take two capsules once a day      . temazepam (RESTORIL) 30 MG capsule Take 30 mg by mouth at bedtime as needed.         No current facility-administered medications for this visit.    Allergies:    Allergies  Allergen Reactions  . Ambien [Zolpidem Tartrate] Other (See Comments)    Confusion  . Honey Other (See Comments)    Pin and needles feeling    Social History:  The patient  reports that he has quit smoking. His smoking use included Cigars. He has never used smokeless tobacco. He reports that he drinks alcohol. He reports  that he does not use illicit drugs.   ROS:  Please see the history of present illness.   No lower extremity edema, claudication, palpitations, or orthopnea.   All other systems reviewed and negative.   OBJECTIVE: VS:  BP 130/78  Pulse 63  Ht 5\' 8"  (1.727 m)  Wt 163 lb (73.936 kg)  BMI 24.79 kg/m2 Well nourished, well developed, in no acute distress, appears than stated age 23: normal Neck: JVD flat. Carotid bruit absent  Cardiac:  normal S1, S2; RRR; no murmur Lungs:  clear to auscultation bilaterally, no wheezing, rhonchi or rales Abd: soft, nontender, no hepatomegaly Ext: Edema absent. Pulses 2+ and symmetric Skin: warm and dry Neuro:  CNs 2-12 intact, no focal abnormalities noted  EKG:  Normal sinus rhythm with left bundle branch block first degree AV block is noted. No change from prior tracing, 2014.       Signed, Illene Labrador III, MD 06/30/2014 10:05 AM

## 2014-07-09 ENCOUNTER — Telehealth: Payer: Self-pay

## 2014-07-09 DIAGNOSIS — E785 Hyperlipidemia, unspecified: Secondary | ICD-10-CM

## 2014-07-09 NOTE — Telephone Encounter (Signed)
pt aware of lab results.Excellent lab results. No change in current plan. Repeat in one year. pt verbalized understanding.

## 2014-07-09 NOTE — Telephone Encounter (Signed)
Message copied by Lamar Laundry on Wed Jul 09, 2014  9:47 AM ------      Message from: Daneen Schick      Created: Mon Jun 30, 2014  4:23 PM       Excellent lab results. No change in current plan. Repeat in one year ------

## 2015-07-21 ENCOUNTER — Encounter: Payer: Self-pay | Admitting: Interventional Cardiology

## 2015-07-21 ENCOUNTER — Ambulatory Visit (INDEPENDENT_AMBULATORY_CARE_PROVIDER_SITE_OTHER): Payer: Medicare Other | Admitting: Interventional Cardiology

## 2015-07-21 VITALS — BP 154/86 | HR 60 | Ht 67.0 in | Wt 162.8 lb

## 2015-07-21 DIAGNOSIS — I25810 Atherosclerosis of coronary artery bypass graft(s) without angina pectoris: Secondary | ICD-10-CM | POA: Diagnosis not present

## 2015-07-21 DIAGNOSIS — E785 Hyperlipidemia, unspecified: Secondary | ICD-10-CM | POA: Diagnosis not present

## 2015-07-21 DIAGNOSIS — R001 Bradycardia, unspecified: Secondary | ICD-10-CM | POA: Diagnosis not present

## 2015-07-21 NOTE — Patient Instructions (Signed)
Medication Instructions:  Your physician recommends that you continue on your current medications as directed. Please refer to the Current Medication list given to you today.   Labwork: None ordered  Testing/Procedures: None ordered  Follow-Up: Your physician wants you to follow-up in: 1 year with Dr.Champagne You will receive a reminder letter in the mail two months in advance. If you don't receive a letter, please call our office to schedule the follow-up appointment.   Any Other Special Instructions Will Be Listed Below (If Applicable).   

## 2015-07-21 NOTE — Progress Notes (Signed)
Patient ID: Brandon Robles, male   DOB: April 05, 1926, 81 y.o.   MRN: [id number]     Cardiology Office Note   Date:  07/21/2015   ID:  Brandon Robles, DOB August 08, 1926, MRN [id number]  PCP:  Irven Shelling, MD  Cardiologist:  Sinclair Grooms, MD   Chief Complaint  Patient presents with  . Coronary Artery Disease      History of Present Illness: Brandon Robles is a 81 y.o. male who presents for coronary artery disease with prior non-drug-eluting stent implantation during acute inferior in 2001and LAD bare-metal stent 2007, hypertension, hyperlipidemia, sinus node dysfunction,and history of carditis.  [name] is doing well. He denies syncope. No episodes of orthopnea or PND. There is trace ankle edema. He still works in his yard including mowing and trimming. He exercises by walking up to 20-30 minutes several days per week. He has not lost exertional tolerance.    Past Medical History  Diagnosis Date  . Diverticulitis   . Hemorrhoids   . ED (erectile dysfunction)     Vacuum pump  . GERD (gastroesophageal reflux disease)   . Sinoatrial node dysfunction (HCC)     EC Holter 24hr 06/24/13 showed sinus bradycardia with probable chronotropic incompetence. May need pacemaker per Dr. Daneen Schick.  . LBBB (left bundle branch block)   . Essential hypertension, benign     Stable, on lisinopril-HCTZ & Atenolol.  [name] CAD (coronary artery disease)     S/P inferior MI 07/2000 with RCA stenting at that time. In 03/2006 he had a LAD non-drug-eluting stent implanted.  . Pure hypercholesterolemia     On Red Yeast Rice & CoQ 10.  . Chronic insomnia   . H/O pericarditis   . Blastocystis hominis   . Multiple rib fractures 09/25/10    PTX 09/2010  . Nephrolithiasis     Renal stone basketing.  . Complex renal cyst     Left  . Skin cancer     Left base (Face) & (Ear in 12/2011).    Past Surgical History  Procedure Laterality Date  . Right thoracotomy,open reduction and internal fixation of ribs  4-7  09/25/2010  . Stone extraction with basket      Renal  . Prostate biopsy      Evidence  . Coronary angioplasty with stent placement  07/25/00    MI in 07/25/00 with RCA stenting at that time.  . Coronary angioplasty with stent placement  04/14/06    Bare metal stent in proximal LAD [facility] - 3.5 x16 stent) by Dr. Linard Millers.     Current Outpatient Prescriptions  Medication Sig Dispense Refill  . aspirin 325 MG tablet Take 325 mg by mouth daily.     . Coenzyme Q10 (COQ-10) 50 MG CAPS Take 1 capsule by mouth daily.     [name] lisinopril-hydrochlorothiazide (PRINZIDE,ZESTORETIC) 10-12.5 MG per tablet Take 1 tablet by mouth daily.     . nitroGLYCERIN (NITROSTAT) 0.4 MG SL tablet Place 0.4 mg under the tongue every 5 (five) minutes as needed for chest pain. (MAXIMUM of 3 TABLETS)    . Omega-3 Fatty Acids (FISH OIL) 600 MG CAPS Take 600 mg by mouth daily.    [name] OVER THE COUNTER MEDICATION Take 3 capsules by mouth daily. OTC muscadine plus    . Red Yeast Rice 600 MG CAPS Take 1 capsule by mouth 2 (two) times daily.     . temazepam (RESTORIL) 30 MG capsule Take 30 mg by  mouth at bedtime as needed for sleep.      No current facility-administered medications for this visit.    Allergies:   Vitamin d analogs; Ambien; and Honey    Social History:  The patient  reports that he has quit smoking. His smoking use included Cigars. He has never used smokeless tobacco. He reports that he drinks alcohol. He reports that he does not use illicit drugs.   Family History:  The patient's family history includes Aneurysm in his sister; CVA in his mother; Diabetes in his brother; Heart attack in his father; Heart disease in his brother and sister; Heart failure in his brother; Hypertension in his brother; Kidney failure in his father; Lung disease in his brother.    ROS:  Please see the history of present illness.   Otherwise, review of systems are positive for dyspnea, dizziness, and one episode  of chest discomfort in January 2016 that lasted seconds before resolving. A commenced while D no post on a day that was 29F.[name]   All other systems are reviewed and negative.    PHYSICAL EXAM: VS:  BP 154/86 mmHg  Pulse 60  Ht 5\' 7"  (1.702 m)  Wt 73.846 kg (162 lb 12.8 oz)  BMI 25.49 kg/m2 , BMI Body mass index is 25.49 kg/(m^2). GEN: Well nourished, well developed, in no acute distress.  appear shunt of his stated age 29: normal Neck: no JVD, carotid bruits, or masses Cardiac: RRR.  There is no murmur, rub, or gallop. There is no edema. Respiratory:  clear to auscultation bilaterally, normal work of breathing. GI: soft, nontender, nondistended, + BS MS: no deformity or atrophy Skin: warm and dry, no rash Neuro:  Strength and sensation are intact Psych: euthymic mood, full affect   EKG:  EKG sinus bradycardia, significant first-degree AV block, Wenckebach second-degree AV block, and left anterior hemiblock. Versus left bundle branch block.   Recent Labs: No results found for requested labs within last 365 days.    Lipid Panel    Component Value Date/Time   CHOL 140 06/30/2014 1025   TRIG 48.0 06/30/2014 1025   HDL 45.80 06/30/2014 1025   CHOLHDL 3 06/30/2014 1025   VLDL 9.6 06/30/2014 1025   LDLCALC 85 06/30/2014 1025      Wt Readings from Last 3 Encounters:  07/21/15 73.846 kg (162 lb 12.8 oz)  06/30/14 73.936 kg (163 lb)  07/18/13 75.751 kg (167 lb)      Other studies Reviewed: Additional studies/ records that were reviewed today include: no recent hospital stay. His laboratory data was reviewed.. The findings include none.    ASSESSMENT AND PLAN:  1. Coronary artery disease involving other coronary artery bypass graft without angina pectoris Stable without angina  2. Hyperlipidemia Followed by Dr. Lavone Orn  3. Bradycardia Secondary to sinus node dysfunction and there is also evidence of AV node dysfunction   4.  Hypertension controlled    Current medicines are reviewed at length with the patient today.  The patient has the following concerns regarding medicines: none.  The following changes/actions have been instituted:    Call if chest discomfort. I do not believe functional testing is indicated  Labs/ tests ordered today include:   Orders Placed This Encounter  Procedures  . EKG 12-Lead     Disposition:   FU with HS in 1 year  Signed, Sinclair Grooms, MD  07/21/2015 12:16 PM    [address] [place], [place]  [address] Phone: [phone]; Fax: [phone]

## 2015-11-30 DIAGNOSIS — Z0000 Encounter for general adult medical examination without abnormal findings: Secondary | ICD-10-CM | POA: Diagnosis not present

## 2015-11-30 DIAGNOSIS — I129 Hypertensive chronic kidney disease with stage 1 through stage 4 chronic kidney disease, or unspecified chronic kidney disease: Secondary | ICD-10-CM | POA: Diagnosis not present

## 2015-11-30 DIAGNOSIS — Z1389 Encounter for screening for other disorder: Secondary | ICD-10-CM | POA: Diagnosis not present

## 2015-11-30 DIAGNOSIS — N183 Chronic kidney disease, stage 3 (moderate): Secondary | ICD-10-CM | POA: Diagnosis not present

## 2015-11-30 DIAGNOSIS — G4700 Insomnia, unspecified: Secondary | ICD-10-CM | POA: Diagnosis not present

## 2015-11-30 DIAGNOSIS — E7800 Pure hypercholesterolemia, unspecified: Secondary | ICD-10-CM | POA: Diagnosis not present

## 2016-02-24 ENCOUNTER — Emergency Department (HOSPITAL_COMMUNITY): Payer: Medicare Other

## 2016-02-24 ENCOUNTER — Encounter (HOSPITAL_COMMUNITY): Payer: Self-pay | Admitting: Adult Health

## 2016-02-24 ENCOUNTER — Inpatient Hospital Stay (HOSPITAL_COMMUNITY)
Admission: EM | Admit: 2016-02-24 | Discharge: 2016-02-25 | DRG: 087 | Disposition: A | Discharge status: Home / Self Care | Payer: Medicare Other | Attending: General Surgery | Admitting: General Surgery

## 2016-02-24 DIAGNOSIS — I6200 Nontraumatic subdural hemorrhage, unspecified: Secondary | ICD-10-CM | POA: Diagnosis present

## 2016-02-24 DIAGNOSIS — Z87891 Personal history of nicotine dependence: Secondary | ICD-10-CM | POA: Diagnosis not present

## 2016-02-24 DIAGNOSIS — W109XXA Fall (on) (from) unspecified stairs and steps, initial encounter: Secondary | ICD-10-CM | POA: Diagnosis present

## 2016-02-24 DIAGNOSIS — Z955 Presence of coronary angioplasty implant and graft: Secondary | ICD-10-CM | POA: Diagnosis not present

## 2016-02-24 DIAGNOSIS — I2510 Atherosclerotic heart disease of native coronary artery without angina pectoris: Secondary | ICD-10-CM | POA: Diagnosis present

## 2016-02-24 DIAGNOSIS — M7989 Other specified soft tissue disorders: Secondary | ICD-10-CM | POA: Diagnosis not present

## 2016-02-24 DIAGNOSIS — Z888 Allergy status to other drugs, medicaments and biological substances status: Secondary | ICD-10-CM

## 2016-02-24 DIAGNOSIS — I495 Sick sinus syndrome: Secondary | ICD-10-CM | POA: Diagnosis present

## 2016-02-24 DIAGNOSIS — F5104 Psychophysiologic insomnia: Secondary | ICD-10-CM | POA: Diagnosis present

## 2016-02-24 DIAGNOSIS — S065X0A Traumatic subdural hemorrhage without loss of consciousness, initial encounter: Secondary | ICD-10-CM | POA: Diagnosis not present

## 2016-02-24 DIAGNOSIS — S069X0A Unspecified intracranial injury without loss of consciousness, initial encounter: Secondary | ICD-10-CM | POA: Diagnosis not present

## 2016-02-24 DIAGNOSIS — Z79899 Other long term (current) drug therapy: Secondary | ICD-10-CM

## 2016-02-24 DIAGNOSIS — S069X9A Unspecified intracranial injury with loss of consciousness of unspecified duration, initial encounter: Secondary | ICD-10-CM | POA: Diagnosis not present

## 2016-02-24 DIAGNOSIS — S0292XA Unspecified fracture of facial bones, initial encounter for closed fracture: Secondary | ICD-10-CM | POA: Diagnosis not present

## 2016-02-24 DIAGNOSIS — S62639A Displaced fracture of distal phalanx of unspecified finger, initial encounter for closed fracture: Secondary | ICD-10-CM | POA: Diagnosis not present

## 2016-02-24 DIAGNOSIS — Z8249 Family history of ischemic heart disease and other diseases of the circulatory system: Secondary | ICD-10-CM

## 2016-02-24 DIAGNOSIS — I447 Left bundle-branch block, unspecified: Secondary | ICD-10-CM | POA: Diagnosis present

## 2016-02-24 DIAGNOSIS — S62524A Nondisplaced fracture of distal phalanx of right thumb, initial encounter for closed fracture: Secondary | ICD-10-CM | POA: Diagnosis not present

## 2016-02-24 DIAGNOSIS — K219 Gastro-esophageal reflux disease without esophagitis: Secondary | ICD-10-CM | POA: Diagnosis present

## 2016-02-24 DIAGNOSIS — I252 Old myocardial infarction: Secondary | ICD-10-CM

## 2016-02-24 DIAGNOSIS — S0282XA Fracture of other specified skull and facial bones, left side, initial encounter for closed fracture: Secondary | ICD-10-CM | POA: Diagnosis not present

## 2016-02-24 DIAGNOSIS — S99911A Unspecified injury of right ankle, initial encounter: Secondary | ICD-10-CM | POA: Diagnosis not present

## 2016-02-24 DIAGNOSIS — Z91018 Allergy to other foods: Secondary | ICD-10-CM

## 2016-02-24 DIAGNOSIS — E785 Hyperlipidemia, unspecified: Secondary | ICD-10-CM | POA: Diagnosis not present

## 2016-02-24 DIAGNOSIS — S065X9A Traumatic subdural hemorrhage with loss of consciousness of unspecified duration, initial encounter: Secondary | ICD-10-CM | POA: Diagnosis not present

## 2016-02-24 DIAGNOSIS — S065XAA Traumatic subdural hemorrhage with loss of consciousness status unknown, initial encounter: Secondary | ICD-10-CM

## 2016-02-24 DIAGNOSIS — S62501A Fracture of unspecified phalanx of right thumb, initial encounter for closed fracture: Secondary | ICD-10-CM | POA: Diagnosis present

## 2016-02-24 DIAGNOSIS — S069XAA Unspecified intracranial injury with loss of consciousness status unknown, initial encounter: Secondary | ICD-10-CM

## 2016-02-24 DIAGNOSIS — I10 Essential (primary) hypertension: Secondary | ICD-10-CM | POA: Diagnosis not present

## 2016-02-24 DIAGNOSIS — S199XXA Unspecified injury of neck, initial encounter: Secondary | ICD-10-CM | POA: Diagnosis not present

## 2016-02-24 DIAGNOSIS — S6992XA Unspecified injury of left wrist, hand and finger(s), initial encounter: Secondary | ICD-10-CM | POA: Diagnosis not present

## 2016-02-24 DIAGNOSIS — S0232XA Fracture of orbital floor, left side, initial encounter for closed fracture: Secondary | ICD-10-CM | POA: Diagnosis not present

## 2016-02-24 DIAGNOSIS — Z7982 Long term (current) use of aspirin: Secondary | ICD-10-CM

## 2016-02-24 DIAGNOSIS — W19XXXA Unspecified fall, initial encounter: Secondary | ICD-10-CM | POA: Diagnosis present

## 2016-02-24 LAB — BASIC METABOLIC PANEL
Anion gap: 11 (ref 5–15)
BUN: 22 mg/dL — ABNORMAL HIGH (ref 6–20)
CHLORIDE: 105 mmol/L (ref 101–111)
CO2: 26 mmol/L (ref 22–32)
Calcium: 9.3 mg/dL (ref 8.9–10.3)
Creatinine, Ser: 1.18 mg/dL (ref 0.61–1.24)
GFR calc non Af Amer: 54 mL/min — ABNORMAL LOW (ref >60)
Glucose, Bld: 147 mg/dL — ABNORMAL HIGH (ref 65–99)
POTASSIUM: 3.9 mmol/L (ref 3.5–5.1)
SODIUM: 142 mmol/L (ref 135–145)

## 2016-02-24 LAB — CBC WITH DIFFERENTIAL/PLATELET
BASOS PCT: 0 %
Basophils Absolute: 0 10*3/uL (ref 0.0–0.1)
EOS ABS: 0 10*3/uL (ref 0.0–0.7)
Eosinophils Relative: 0 %
HEMATOCRIT: 36 % — AB (ref 39.0–52.0)
HEMOGLOBIN: 12.1 g/dL — AB (ref 13.0–17.0)
LYMPHS ABS: 0.8 10*3/uL (ref 0.7–4.0)
Lymphocytes Relative: 6 %
MCH: 31.8 pg (ref 26.0–34.0)
MCHC: 33.6 g/dL (ref 30.0–36.0)
MCV: 94.7 fL (ref 78.0–100.0)
Monocytes Absolute: 0.8 10*3/uL (ref 0.1–1.0)
Monocytes Relative: 6 %
NEUTROS ABS: 11.2 10*3/uL — AB (ref 1.7–7.7)
NEUTROS PCT: 88 %
Platelets: 207 10*3/uL (ref 150–400)
RBC: 3.8 MIL/uL — AB (ref 4.22–5.81)
RDW: 13.5 % (ref 11.5–15.5)
WBC: 12.8 10*3/uL — AB (ref 4.0–10.5)

## 2016-02-24 LAB — MRSA PCR SCREENING: MRSA BY PCR: NEGATIVE

## 2016-02-24 LAB — APTT: APTT: 25 s (ref 24–37)

## 2016-02-24 LAB — PROTIME-INR
INR: 1.14 (ref 0.00–1.49)
Prothrombin Time: 14.8 seconds (ref 11.6–15.2)

## 2016-02-24 LAB — GLUCOSE, CAPILLARY: GLUCOSE-CAPILLARY: 173 mg/dL — AB (ref 65–99)

## 2016-02-24 MED ORDER — SODIUM CHLORIDE 0.9 % IV SOLN
INTRAVENOUS | Status: DC
  Administered 2016-02-24: 09:00:00 via INTRAVENOUS

## 2016-02-24 MED ORDER — TEMAZEPAM 15 MG PO CAPS
30.0000 mg | ORAL_CAPSULE | Freq: Every evening | ORAL | Status: DC | PRN
  Administered 2016-02-25: 30 mg via ORAL
  Filled 2016-02-24: qty 1
  Filled 2016-02-24: qty 2

## 2016-02-24 MED ORDER — NITROGLYCERIN 0.4 MG SL SUBL: 0.4000 mg | SUBLINGUAL_TABLET | SUBLINGUAL | Status: DC | PRN

## 2016-02-24 MED ORDER — HYDROCHLOROTHIAZIDE 12.5 MG PO CAPS
12.5000 mg | ORAL_CAPSULE | Freq: Every day | ORAL | Status: DC
  Administered 2016-02-24 – 2016-02-25 (×2): 12.5 mg via ORAL
  Filled 2016-02-24 (×2): qty 1

## 2016-02-24 MED ORDER — ACETAMINOPHEN 325 MG PO TABS
650.0000 mg | ORAL_TABLET | ORAL | Status: DC | PRN
  Administered 2016-02-24 (×2): 650 mg via ORAL
  Filled 2016-02-24 (×2): qty 2

## 2016-02-24 MED ORDER — LISINOPRIL-HYDROCHLOROTHIAZIDE 10-12.5 MG PO TABS: 1.0000 | ORAL_TABLET | Freq: Every day | ORAL | Status: DC

## 2016-02-24 MED ORDER — LISINOPRIL 10 MG PO TABS
10.0000 mg | ORAL_TABLET | Freq: Every day | ORAL | Status: DC
  Administered 2016-02-24 – 2016-02-25 (×2): 10 mg via ORAL
  Filled 2016-02-24 (×2): qty 1

## 2016-02-24 MED ORDER — MORPHINE SULFATE (PF) 2 MG/ML IV SOLN: 2.0000 mg | INTRAVENOUS | Status: DC | PRN

## 2016-02-24 MED ORDER — SODIUM CHLORIDE 0.9% FLUSH
3.0000 mL | INTRAVENOUS | Status: DC | PRN
Start: 2016-02-24 — End: 2016-02-24

## 2016-02-24 MED ORDER — SODIUM CHLORIDE 0.9 % IV SOLN: 250.0000 mL | INTRAVENOUS | Status: DC | PRN

## 2016-02-24 MED ORDER — ONDANSETRON HCL 4 MG PO TABS: 4.0000 mg | ORAL_TABLET | Freq: Four times a day (QID) | ORAL | Status: DC | PRN

## 2016-02-24 MED ORDER — ONDANSETRON HCL 4 MG/2ML IJ SOLN
4.0000 mg | Freq: Four times a day (QID) | INTRAMUSCULAR | Status: DC | PRN
Start: 2016-02-24 — End: 2016-02-25

## 2016-02-24 MED ORDER — ONDANSETRON HCL 4 MG/2ML IJ SOLN
4.0000 mg | INTRAMUSCULAR | Status: AC
  Administered 2016-02-24: 4 mg via INTRAVENOUS
  Filled 2016-02-24: qty 2

## 2016-02-24 MED ORDER — DOCUSATE SODIUM 100 MG PO CAPS
100.0000 mg | ORAL_CAPSULE | Freq: Two times a day (BID) | ORAL | Status: DC
  Administered 2016-02-24 – 2016-02-25 (×2): 100 mg via ORAL
  Filled 2016-02-24 (×4): qty 1

## 2016-02-24 MED ORDER — SODIUM CHLORIDE 0.9% FLUSH
3.0000 mL | Freq: Two times a day (BID) | INTRAVENOUS | Status: DC
  Administered 2016-02-24: 3 mL via INTRAVENOUS

## 2016-02-24 MED ORDER — COQ-10 50 MG PO CAPS: 1.0000 | ORAL_CAPSULE | Freq: Every day | ORAL | Status: DC

## 2016-02-24 NOTE — H&P (Addendum)
History   Brandon Robles is an 82 y.o. male.   Chief Complaint:  Chief Complaint  Patient presents with  . Fall    Fall Associated symptoms include myalgias. Pertinent negatives include no abdominal pain, chest pain, chills, coughing, fever, headaches, nausea, neck pain, rash or vomiting.  Trauma Mechanism of injury: fall Injury location: head/neck Injury location detail: head Incident location: home Arrived directly from scene: yes   Fall:      Fall occurred: down stairs      Point of impact: head      Entrapped after fall: no  EMS/PTA data:      Ambulatory at scene: no      Blood loss: minimal      Responsiveness: alert      Oriented to: person, place, situation and time      Loss of consciousness: yes      Amnesic to event: yes      Airway interventions: none      Breathing interventions: none      IV access: established      Fluids administered: none      Cardiac interventions: none  Current symptoms:      Pain scale: 4/10      Pain quality: aching      Associated symptoms:            Reports loss of consciousness.            Denies abdominal pain, chest pain, headache, hearing loss, nausea, neck pain and vomiting.    Past Medical History  Diagnosis Date  . Diverticulitis   . Hemorrhoids   . ED (erectile dysfunction)     Vacuum pump  . GERD (gastroesophageal reflux disease)   . Sinoatrial node dysfunction (HCC)     EC Holter 24hr 06/24/13 showed sinus bradycardia with probable chronotropic incompetence. May need pacemaker per Dr. Daneen Schick.  . LBBB (left bundle branch block)   . Essential hypertension, benign     Stable, on lisinopril-HCTZ & Atenolol.  [name] CAD (coronary artery disease)     S/P inferior MI 07/2000 with RCA stenting at that time. In 03/2006 he had a LAD non-drug-eluting stent implanted.  . Pure hypercholesterolemia     On Red Yeast Rice & CoQ 10.  . Chronic insomnia   . H/O pericarditis   . Blastocystis hominis   . Multiple rib fractures  09/25/10    PTX 09/2010  . Nephrolithiasis     Renal stone basketing.  . Complex renal cyst     Left  . Skin cancer     Left base (Face) & (Ear in 12/2011).    Past Surgical History  Procedure Laterality Date  . Right thoracotomy,open reduction and internal fixation of ribs 4-7  09/25/2010  . Stone extraction with basket      Renal  . Prostate biopsy      Evidence  . Coronary angioplasty with stent placement  07/25/00    MI in 07/25/00 with RCA stenting at that time.  . Coronary angioplasty with stent placement  04/14/06    Bare metal stent in proximal LAD [facility] - 3.5 x16 stent) by Dr. Linard Millers.    Family History  Problem Relation Age of Onset  . CVA Mother   . Heart attack Father   . Kidney failure Father   . Heart disease Sister     Possible heart disease  . Aneurysm Sister   . Hypertension Brother   .  Heart disease Brother   . Heart failure Brother   . Lung disease Brother   . Diabetes Brother    Social History:  reports that he has quit smoking. His smoking use included Cigars. He has never used smokeless tobacco. He reports that he drinks alcohol. He reports that he does not use illicit drugs.  Allergies   Allergies  Allergen Reactions  . Vitamin D Analogs Other (See Comments)    REACTION: "PIN AND NEEDLE FEELING"  . Ambien [Zolpidem Tartrate] Other (See Comments)    Confusion  . Honey Other (See Comments)    Pin and needles feeling    Home Medications   (Not in a hospital admission)  Trauma Course  No results found for this or any previous visit (from the past 48 hour(s)). Dg Wrist Complete Left  02/24/2016  CLINICAL DATA:  Initial evaluation for acute trauma, fall. EXAM: LEFT WRIST - COMPLETE 3+ VIEW COMPARISON:  None available. FINDINGS: No acute fracture or dislocation. Corticated osseous density at the dorsal aspect of the proximal hand on lateral projection appears chronic. Mild soft tissue swelling at the dorsal aspect of the  hand. No other soft tissue abnormality. Osseous mineralization normal. Degenerative osteoarthrosis noted at the first [facility] joint. IMPRESSION: 1. No acute fracture dislocation. 2. Mild soft tissue swelling at the dorsal aspect of the hand/wrist. 3. Osseous fragment at the dorsal aspect of the proximal hand on lateral projection demonstrates corticated margins, and is favored to be chronic in nature. Electronically Signed   By: Jeannine Boga M.D.   On: 02/24/2016 06:00   Dg Ankle Complete Right  02/24/2016  CLINICAL DATA:  Initial evaluation for acute trauma, fall. EXAM: RIGHT ANKLE - COMPLETE 3+ VIEW COMPARISON:  None. FINDINGS: No acute fracture or dislocation. Ankle mortise is approximated. Talar dome intact. No significant soft tissue swelling about the ankle. Vascular calcifications noted. Posterior calcaneal enthesophyte noted. Mild degenerate spurring at the dorsal talonavicular joint. IMPRESSION: No acute fracture or dislocation Electronically Signed   By: Jeannine Boga M.D.   On: 02/24/2016 06:03   Ct Head Wo Contrast  02/24/2016  CLINICAL DATA:  Slipped and fell at home. EXAM: CT HEAD WITHOUT CONTRAST CT MAXILLOFACIAL WITHOUT CONTRAST CT CERVICAL SPINE WITHOUT CONTRAST TECHNIQUE: Multidetector CT imaging of the head, cervical spine, and maxillofacial structures were performed using the standard protocol without intravenous contrast. Multiplanar CT image reconstructions of the cervical spine and maxillofacial structures were also generated. COMPARISON:  09/22/2010 FINDINGS: CT HEAD FINDINGS There is an acute left subdural hematoma in the anterior temporal and frontotemporal regions to a depth of 4-5 mm. There is no mass effect or midline shift. There is no intracranial hemorrhage. There is no intraventricular hemorrhage. There is mild to moderate generalized atrophy. There is extensive white matter hypodensity which is chronic and likely due to small vessel disease. The calvarium and  skullbase are intact. CT MAXILLOFACIAL FINDINGS There is periorbital soft tissue swelling on the left. The globes appear intact. There is a nondisplaced fracture of the left zygomatic arch. There are nondisplaced fractures of the left maxillary sinus anterior posterior walls, as well as the left orbit lateral wall. Orbital floor is intact. Mandible and TMJ are intact. Pterygoid plates are intact. CT CERVICAL SPINE FINDINGS The vertebral column, pedicles and facet articulations are intact. There is no evidence of acute fracture. No acute soft tissue abnormalities are evident. No significant arthritic changes are evident. IMPRESSION: 1. Acute left subdural hematoma to a depth of 4-5 mm,  in the anterior temporal and from temporal regions. No significant mass effect or midline shift. 2. Unchanged atrophy and chronic white matter hypodensity. No acute intraparenchymal brain abnormality. 3. Nondisplaced left-sided facial fractures involving the lateral orbit, zygomatic arch and maxillary sinus. 4. Negative for acute cervical spine fracture. These results were called by telephone at the time of interpretation on 02/24/2016 at 5:38 am to Dr. Bernerd Limbo , who verbally acknowledged these results. Electronically Signed   By: Andreas Newport M.D.   On: 02/24/2016 05:42   Ct Cervical Spine Wo Contrast  02/24/2016  CLINICAL DATA:  Slipped and fell at home. EXAM: CT HEAD WITHOUT CONTRAST CT MAXILLOFACIAL WITHOUT CONTRAST CT CERVICAL SPINE WITHOUT CONTRAST TECHNIQUE: Multidetector CT imaging of the head, cervical spine, and maxillofacial structures were performed using the standard protocol without intravenous contrast. Multiplanar CT image reconstructions of the cervical spine and maxillofacial structures were also generated. COMPARISON:  09/22/2010 FINDINGS: CT HEAD FINDINGS There is an acute left subdural hematoma in the anterior temporal and frontotemporal regions to a depth of 4-5 mm. There is no mass effect or  midline shift. There is no intracranial hemorrhage. There is no intraventricular hemorrhage. There is mild to moderate generalized atrophy. There is extensive white matter hypodensity which is chronic and likely due to small vessel disease. The calvarium and skullbase are intact. CT MAXILLOFACIAL FINDINGS There is periorbital soft tissue swelling on the left. The globes appear intact. There is a nondisplaced fracture of the left zygomatic arch. There are nondisplaced fractures of the left maxillary sinus anterior posterior walls, as well as the left orbit lateral wall. Orbital floor is intact. Mandible and TMJ are intact. Pterygoid plates are intact. CT CERVICAL SPINE FINDINGS The vertebral column, pedicles and facet articulations are intact. There is no evidence of acute fracture. No acute soft tissue abnormalities are evident. No significant arthritic changes are evident. IMPRESSION: 1. Acute left subdural hematoma to a depth of 4-5 mm, in the anterior temporal and from temporal regions. No significant mass effect or midline shift. 2. Unchanged atrophy and chronic white matter hypodensity. No acute intraparenchymal brain abnormality. 3. Nondisplaced left-sided facial fractures involving the lateral orbit, zygomatic arch and maxillary sinus. 4. Negative for acute cervical spine fracture. These results were called by telephone at the time of interpretation on 02/24/2016 at 5:38 am to Dr. Bernerd Limbo , who verbally acknowledged these results. Electronically Signed   By: Andreas Newport M.D.   On: 02/24/2016 05:42   Dg Hand Complete Right  02/24/2016  CLINICAL DATA:  Slipped and fell tonight. EXAM: RIGHT HAND - COMPLETE 3+ VIEW COMPARISON:  None. FINDINGS: There is a nondisplaced transverse fracture across the base of the first distal phalanx, probably sparing the articular surface. There is no dislocation. No other acute fracture is evident. There is no radiopaque foreign body. IMPRESSION: First distal  phalangeal fracture, nondisplaced. Electronically Signed   By: Andreas Newport M.D.   On: 02/24/2016 06:01   Ct Maxillofacial Wo Cm  02/24/2016  CLINICAL DATA:  Slipped and fell at home. EXAM: CT HEAD WITHOUT CONTRAST CT MAXILLOFACIAL WITHOUT CONTRAST CT CERVICAL SPINE WITHOUT CONTRAST TECHNIQUE: Multidetector CT imaging of the head, cervical spine, and maxillofacial structures were performed using the standard protocol without intravenous contrast. Multiplanar CT image reconstructions of the cervical spine and maxillofacial structures were also generated. COMPARISON:  09/22/2010 FINDINGS: CT HEAD FINDINGS There is an acute left subdural hematoma in the anterior temporal and frontotemporal regions to a depth of 4-5 mm.  There is no mass effect or midline shift. There is no intracranial hemorrhage. There is no intraventricular hemorrhage. There is mild to moderate generalized atrophy. There is extensive white matter hypodensity which is chronic and likely due to small vessel disease. The calvarium and skullbase are intact. CT MAXILLOFACIAL FINDINGS There is periorbital soft tissue swelling on the left. The globes appear intact. There is a nondisplaced fracture of the left zygomatic arch. There are nondisplaced fractures of the left maxillary sinus anterior posterior walls, as well as the left orbit lateral wall. Orbital floor is intact. Mandible and TMJ are intact. Pterygoid plates are intact. CT CERVICAL SPINE FINDINGS The vertebral column, pedicles and facet articulations are intact. There is no evidence of acute fracture. No acute soft tissue abnormalities are evident. No significant arthritic changes are evident. IMPRESSION: 1. Acute left subdural hematoma to a depth of 4-5 mm, in the anterior temporal and from temporal regions. No significant mass effect or midline shift. 2. Unchanged atrophy and chronic white matter hypodensity. No acute intraparenchymal brain abnormality. 3. Nondisplaced left-sided facial  fractures involving the lateral orbit, zygomatic arch and maxillary sinus. 4. Negative for acute cervical spine fracture. These results were called by telephone at the time of interpretation on 02/24/2016 at 5:38 am to Dr. Bernerd Limbo , who verbally acknowledged these results. Electronically Signed   By: Andreas Newport M.D.   On: 02/24/2016 05:42    Review of Systems  Constitutional: Negative for fever and chills.  HENT: Negative for hearing loss.   Eyes: Negative for blurred vision and double vision.  Respiratory: Negative for cough and hemoptysis.   Cardiovascular: Negative for chest pain and palpitations.  Gastrointestinal: Negative for nausea, vomiting and abdominal pain.  Genitourinary: Negative for dysuria and urgency.  Musculoskeletal: Positive for myalgias and joint pain. Negative for neck pain.  Skin: Negative for itching and rash.  Neurological: Positive for loss of consciousness. Negative for dizziness, tingling and headaches.  Endo/Heme/Allergies: Does not bruise/bleed easily.  Psychiatric/Behavioral: Negative for depression and suicidal ideas.    Blood pressure 134/75, pulse 60, temperature 98.1 F (36.7 C), temperature source Oral, resp. rate 23, SpO2 98 %. Physical Exam  Vitals reviewed. Constitutional: He is oriented to person, place, and time. He appears well-developed and well-nourished.  HENT:  Head: Normocephalic.  Left eye ecchymosis, old scalp abrasion mid scalp  Eyes: Conjunctivae and EOM are normal. Pupils are equal, round, and reactive to light.  Neck: Normal range of motion. Neck supple.  Cardiovascular: Normal rate and regular rhythm.   Respiratory: Effort normal and breath sounds normal.  GI: Soft. Bowel sounds are normal. He exhibits no distension. There is no tenderness.  Musculoskeletal: Normal range of motion.  4/5 left hand grip, 5/5 all other extremities and joints  Neurological: He is alert and oriented to person, place, and time.  GCS 15   Skin: Skin is warm and dry.  Psychiatric: He has a normal mood and affect. His behavior is normal.     Assessment/Plan 82 yo male fall down stairs with smal SDH, GCS 15, on baby aspirin, amnesic to event. Right hand 1st distal phalanx fracture -admit to trauma -f/u nsg recs -consult ENT -labs -neuro exams -split right hand distal phalanx  Brandon Robles 02/24/2016, 6:32 AM   Procedures   Discussed fractures with Dr. Claudean Kinds ENT. He recommends follow up in 5 days in office

## 2016-02-24 NOTE — Progress Notes (Signed)
Patient doing fine.  Completely awake and alert.  Thinks that he tripped and fell down the stairs.  Got himself up and went back upstair.  Okay to transfer to [facility].  Advance diet.  Kathryne Eriksson. Dahlia Bailiff, MD, [facility] [phone] Trauma Surgeon

## 2016-02-24 NOTE — Consult Note (Signed)
Reason for Consult:SDH  Referring Physician: Trauma  Brandon Robles is an 82 y.o. male.  H 82 year old male admitted status post fall. History of fall unclear, mechanical versus syncope. Patient with no recollection of the events surrounding fall. Patient complains of left-sided headache. No complaints of numbness, paresthesias or weakness. No history of hypoxia, hypertension or seizure.  Past Medical History  Diagnosis Date  . Diverticulitis   . Hemorrhoids   . ED (erectile dysfunction)     Vacuum pump  . GERD (gastroesophageal reflux disease)   . Sinoatrial node dysfunction (HCC)     EC Holter 24hr 06/24/13 showed sinus bradycardia with probable chronotropic incompetence. May need pacemaker per Dr. Daneen Schick.  . LBBB (left bundle branch block)   . Essential hypertension, benign     Stable, on lisinopril-HCTZ & Atenolol.  [name] CAD (coronary artery disease)     S/P inferior MI 07/2000 with RCA stenting at that time. In 03/2006 he had a LAD non-drug-eluting stent implanted.  . Pure hypercholesterolemia     On Red Yeast Rice & CoQ 10.  . Chronic insomnia   . H/O pericarditis   . Blastocystis hominis   . Multiple rib fractures 09/25/10    PTX 09/2010  . Nephrolithiasis     Renal stone basketing.  . Complex renal cyst     Left  . Skin cancer     Left base (Face) & (Ear in 12/2011).    Past Surgical History  Procedure Laterality Date  . Right thoracotomy,open reduction and internal fixation of ribs 4-7  09/25/2010  . Stone extraction with basket      Renal  . Prostate biopsy      Evidence  . Coronary angioplasty with stent placement  07/25/00    MI in 07/25/00 with RCA stenting at that time.  . Coronary angioplasty with stent placement  04/14/06    Bare metal stent in proximal LAD [facility] - 3.5 x16 stent) by Dr. Linard Millers.    Family History  Problem Relation Age of Onset  . CVA Mother   . Heart attack Father   . Kidney failure Father   . Heart disease Sister      Possible heart disease  . Aneurysm Sister   . Hypertension Brother   . Heart disease Brother   . Heart failure Brother   . Lung disease Brother   . Diabetes Brother     Social History:  reports that he has quit smoking. His smoking use included Cigars. He has never used smokeless tobacco. He reports that he drinks alcohol. He reports that he does not use illicit drugs.  Allergies:  Allergies  Allergen Reactions  . Vitamin D Analogs Other (See Comments)    "PIN AND NEEDLE FEELING"  . Ambien [Zolpidem Tartrate] Other (See Comments)    Confusion  . Honey Other (See Comments)    Pin and needles feeling    Medications: I have reviewed the patient's current medications.  Results for orders placed or performed during the hospital encounter of 02/24/16 (from the past 48 hour(s))  CBC with Differential     Status: Abnormal   Collection Time: 02/24/16  6:00 AM  Result Value Ref Range   WBC 12.8 (H) 4.0 - 10.5 K/uL   RBC 3.80 (L) 4.22 - 5.81 MIL/uL   Hemoglobin 12.1 (L) 13.0 - 17.0 g/dL   HCT 36.0 (L) 39.0 - 52.0 %   MCV 94.7 78.0 - 100.0 fL   MCH  31.8 26.0 - 34.0 pg   MCHC 33.6 30.0 - 36.0 g/dL   RDW 13.5 11.5 - 15.5 %   Platelets 207 150 - 400 K/uL   Neutrophils Relative % 88 %   Neutro Abs 11.2 (H) 1.7 - 7.7 K/uL   Lymphocytes Relative 6 %   Lymphs Abs 0.8 0.7 - 4.0 K/uL   Monocytes Relative 6 %   Monocytes Absolute 0.8 0.1 - 1.0 K/uL   Eosinophils Relative 0 %   Eosinophils Absolute 0.0 0.0 - 0.7 K/uL   Basophils Relative 0 %   Basophils Absolute 0.0 0.0 - 0.1 K/uL  Basic metabolic panel     Status: Abnormal   Collection Time: 02/24/16  6:00 AM  Result Value Ref Range   Sodium 142 135 - 145 mmol/L   Potassium 3.9 3.5 - 5.1 mmol/L   Chloride 105 101 - 111 mmol/L   CO2 26 22 - 32 mmol/L   Glucose, Bld 147 (H) 65 - 99 mg/dL   BUN 22 (H) 6 - 20 mg/dL   Creatinine, Ser 1.18 0.61 - 1.24 mg/dL   Calcium 9.3 8.9 - 10.3 mg/dL   GFR calc non Af Amer 54 (L) >60 mL/min    GFR calc Af Amer >60 >60 mL/min    Comment: (NOTE) The eGFR has been calculated using the CKD EPI equation. This calculation has not been validated in all clinical situations. eGFR's persistently <60 mL/min signify possible Chronic Kidney Disease.    Anion gap 11 5 - 15  Protime-INR     Status: None   Collection Time: 02/24/16  7:36 AM  Result Value Ref Range   Prothrombin Time 14.8 11.6 - 15.2 seconds   INR 1.14 0.00 - 1.49  APTT     Status: None   Collection Time: 02/24/16  7:36 AM  Result Value Ref Range   aPTT 25 24 - 37 seconds  Glucose, capillary     Status: Abnormal   Collection Time: 02/24/16  8:39 AM  Result Value Ref Range   Glucose-Capillary 173 (H) 65 - 99 mg/dL    Dg Wrist Complete Left  02/24/2016  CLINICAL DATA:  Initial evaluation for acute trauma, fall. EXAM: LEFT WRIST - COMPLETE 3+ VIEW COMPARISON:  None available. FINDINGS: No acute fracture or dislocation. Corticated osseous density at the dorsal aspect of the proximal hand on lateral projection appears chronic. Mild soft tissue swelling at the dorsal aspect of the hand. No other soft tissue abnormality. Osseous mineralization normal. Degenerative osteoarthrosis noted at the first [facility] joint. IMPRESSION: 1. No acute fracture dislocation. 2. Mild soft tissue swelling at the dorsal aspect of the hand/wrist. 3. Osseous fragment at the dorsal aspect of the proximal hand on lateral projection demonstrates corticated margins, and is favored to be chronic in nature. Electronically Signed   By: Jeannine Boga M.D.   On: 02/24/2016 06:00   Dg Ankle Complete Right  02/24/2016  CLINICAL DATA:  Initial evaluation for acute trauma, fall. EXAM: RIGHT ANKLE - COMPLETE 3+ VIEW COMPARISON:  None. FINDINGS: No acute fracture or dislocation. Ankle mortise is approximated. Talar dome intact. No significant soft tissue swelling about the ankle. Vascular calcifications noted. Posterior calcaneal enthesophyte noted. Mild degenerate  spurring at the dorsal talonavicular joint. IMPRESSION: No acute fracture or dislocation Electronically Signed   By: Jeannine Boga M.D.   On: 02/24/2016 06:03   Ct Head Wo Contrast  02/24/2016  CLINICAL DATA:  Slipped and fell at home. EXAM: CT HEAD WITHOUT  CONTRAST CT MAXILLOFACIAL WITHOUT CONTRAST CT CERVICAL SPINE WITHOUT CONTRAST TECHNIQUE: Multidetector CT imaging of the head, cervical spine, and maxillofacial structures were performed using the standard protocol without intravenous contrast. Multiplanar CT image reconstructions of the cervical spine and maxillofacial structures were also generated. COMPARISON:  09/22/2010 FINDINGS: CT HEAD FINDINGS There is an acute left subdural hematoma in the anterior temporal and frontotemporal regions to a depth of 4-5 mm. There is no mass effect or midline shift. There is no intracranial hemorrhage. There is no intraventricular hemorrhage. There is mild to moderate generalized atrophy. There is extensive white matter hypodensity which is chronic and likely due to small vessel disease. The calvarium and skullbase are intact. CT MAXILLOFACIAL FINDINGS There is periorbital soft tissue swelling on the left. The globes appear intact. There is a nondisplaced fracture of the left zygomatic arch. There are nondisplaced fractures of the left maxillary sinus anterior posterior walls, as well as the left orbit lateral wall. Orbital floor is intact. Mandible and TMJ are intact. Pterygoid plates are intact. CT CERVICAL SPINE FINDINGS The vertebral column, pedicles and facet articulations are intact. There is no evidence of acute fracture. No acute soft tissue abnormalities are evident. No significant arthritic changes are evident. IMPRESSION: 1. Acute left subdural hematoma to a depth of 4-5 mm, in the anterior temporal and from temporal regions. No significant mass effect or midline shift. 2. Unchanged atrophy and chronic white matter hypodensity. No acute intraparenchymal  brain abnormality. 3. Nondisplaced left-sided facial fractures involving the lateral orbit, zygomatic arch and maxillary sinus. 4. Negative for acute cervical spine fracture. These results were called by telephone at the time of interpretation on 02/24/2016 at 5:38 am to Dr. Bernerd Limbo , who verbally acknowledged these results. Electronically Signed   By: Andreas Newport M.D.   On: 02/24/2016 05:42   Ct Cervical Spine Wo Contrast  02/24/2016  CLINICAL DATA:  Slipped and fell at home. EXAM: CT HEAD WITHOUT CONTRAST CT MAXILLOFACIAL WITHOUT CONTRAST CT CERVICAL SPINE WITHOUT CONTRAST TECHNIQUE: Multidetector CT imaging of the head, cervical spine, and maxillofacial structures were performed using the standard protocol without intravenous contrast. Multiplanar CT image reconstructions of the cervical spine and maxillofacial structures were also generated. COMPARISON:  09/22/2010 FINDINGS: CT HEAD FINDINGS There is an acute left subdural hematoma in the anterior temporal and frontotemporal regions to a depth of 4-5 mm. There is no mass effect or midline shift. There is no intracranial hemorrhage. There is no intraventricular hemorrhage. There is mild to moderate generalized atrophy. There is extensive white matter hypodensity which is chronic and likely due to small vessel disease. The calvarium and skullbase are intact. CT MAXILLOFACIAL FINDINGS There is periorbital soft tissue swelling on the left. The globes appear intact. There is a nondisplaced fracture of the left zygomatic arch. There are nondisplaced fractures of the left maxillary sinus anterior posterior walls, as well as the left orbit lateral wall. Orbital floor is intact. Mandible and TMJ are intact. Pterygoid plates are intact. CT CERVICAL SPINE FINDINGS The vertebral column, pedicles and facet articulations are intact. There is no evidence of acute fracture. No acute soft tissue abnormalities are evident. No significant arthritic changes are  evident. IMPRESSION: 1. Acute left subdural hematoma to a depth of 4-5 mm, in the anterior temporal and from temporal regions. No significant mass effect or midline shift. 2. Unchanged atrophy and chronic white matter hypodensity. No acute intraparenchymal brain abnormality. 3. Nondisplaced left-sided facial fractures involving the lateral orbit, zygomatic arch and maxillary  sinus. 4. Negative for acute cervical spine fracture. These results were called by telephone at the time of interpretation on 02/24/2016 at 5:38 am to Dr. Bernerd Limbo , who verbally acknowledged these results. Electronically Signed   By: Andreas Newport M.D.   On: 02/24/2016 05:42   Dg Hand Complete Right  02/24/2016  CLINICAL DATA:  Slipped and fell tonight. EXAM: RIGHT HAND - COMPLETE 3+ VIEW COMPARISON:  None. FINDINGS: There is a nondisplaced transverse fracture across the base of the first distal phalanx, probably sparing the articular surface. There is no dislocation. No other acute fracture is evident. There is no radiopaque foreign body. IMPRESSION: First distal phalangeal fracture, nondisplaced. Electronically Signed   By: Andreas Newport M.D.   On: 02/24/2016 06:01   Ct Maxillofacial Wo Cm  02/24/2016  CLINICAL DATA:  Slipped and fell at home. EXAM: CT HEAD WITHOUT CONTRAST CT MAXILLOFACIAL WITHOUT CONTRAST CT CERVICAL SPINE WITHOUT CONTRAST TECHNIQUE: Multidetector CT imaging of the head, cervical spine, and maxillofacial structures were performed using the standard protocol without intravenous contrast. Multiplanar CT image reconstructions of the cervical spine and maxillofacial structures were also generated. COMPARISON:  09/22/2010 FINDINGS: CT HEAD FINDINGS There is an acute left subdural hematoma in the anterior temporal and frontotemporal regions to a depth of 4-5 mm. There is no mass effect or midline shift. There is no intracranial hemorrhage. There is no intraventricular hemorrhage. There is mild to moderate  generalized atrophy. There is extensive white matter hypodensity which is chronic and likely due to small vessel disease. The calvarium and skullbase are intact. CT MAXILLOFACIAL FINDINGS There is periorbital soft tissue swelling on the left. The globes appear intact. There is a nondisplaced fracture of the left zygomatic arch. There are nondisplaced fractures of the left maxillary sinus anterior posterior walls, as well as the left orbit lateral wall. Orbital floor is intact. Mandible and TMJ are intact. Pterygoid plates are intact. CT CERVICAL SPINE FINDINGS The vertebral column, pedicles and facet articulations are intact. There is no evidence of acute fracture. No acute soft tissue abnormalities are evident. No significant arthritic changes are evident. IMPRESSION: 1. Acute left subdural hematoma to a depth of 4-5 mm, in the anterior temporal and from temporal regions. No significant mass effect or midline shift. 2. Unchanged atrophy and chronic white matter hypodensity. No acute intraparenchymal brain abnormality. 3. Nondisplaced left-sided facial fractures involving the lateral orbit, zygomatic arch and maxillary sinus. 4. Negative for acute cervical spine fracture. These results were called by telephone at the time of interpretation on 02/24/2016 at 5:38 am to Dr. Bernerd Limbo , who verbally acknowledged these results. Electronically Signed   By: Andreas Newport M.D.   On: 02/24/2016 05:42    Pertinent items noted in HPI and remainder of comprehensive ROS otherwise negative. Blood pressure 109/47, pulse 70, temperature 98.4 F (36.9 C), temperature source Oral, resp. rate 18, height 5' 7"  (1.702 m), weight 73.6 kg (162 lb 4.1 oz), SpO2 98 %. The patient is awake and alert. He is oriented and appropriate. Speech is fluent. His judgment and insight are intact. Cranial nerve function normal. Motor and sensory function extremities normal. Examination head ears eyes and throat demonstrates evidence of  significant periorbital bruising and swelling. No obvious bony abnormality. Oropharynx, nasopharynx and external auditory canals clear. Chest and abdomen benign. Extremities free from injury or deformity.  Assessment/Plan: Status post fall with minimal left frontal subdural hematoma. Recommend follow-up head CT scan in morning.  Tawfiq Favila A 02/24/2016, 10:11 AM

## 2016-02-24 NOTE — ED Provider Notes (Signed)
CSN: [id number]     Arrival date & time 02/24/16  0321 History   First MD Initiated Contact with Patient 02/24/16 [phone]     Chief Complaint  Patient presents with  . Fall    HPI   Brandon Robles is a 82 y.o. male with a PMH of SA dysfunction, LBBB, HTN, HLD, CAD who presents to the ED s/p fall. He states he woke up to get a glass of milk, took a sleeping pill, and fell down the stairs in his home. He reports headache since that time. He denies exacerbating factors. He has not tried anything for symptom relief. He denies LOC, dizziness, lightheadedness, vision changes, numbness, weakness, paresthesia, neck pain, back pain, chest pain, shortness of breath, abdominal pain. He reports one episode of emesis prior to arrival. He also notes mild pain in his left wrist, right thumb, and right ankle. He states his tetanus is up to date.   Past Medical History  Diagnosis Date  . Diverticulitis   . Hemorrhoids   . ED (erectile dysfunction)     Vacuum pump  . GERD (gastroesophageal reflux disease)   . Sinoatrial node dysfunction (HCC)     EC Holter 24hr 06/24/13 showed sinus bradycardia with probable chronotropic incompetence. May need pacemaker per Dr. Daneen Schick.  . LBBB (left bundle branch block)   . Essential hypertension, benign     Stable, on lisinopril-HCTZ & Atenolol.  [name] CAD (coronary artery disease)     S/P inferior MI 07/2000 with RCA stenting at that time. In 03/2006 he had a LAD non-drug-eluting stent implanted.  . Pure hypercholesterolemia     On Red Yeast Rice & CoQ 10.  . Chronic insomnia   . H/O pericarditis   . Blastocystis hominis   . Multiple rib fractures 09/25/10    PTX 09/2010  . Nephrolithiasis     Renal stone basketing.  . Complex renal cyst     Left  . Skin cancer     Left base (Face) & (Ear in 12/2011).   Past Surgical History  Procedure Laterality Date  . Right thoracotomy,open reduction and internal fixation of ribs 4-7  09/25/2010  . Stone extraction with basket       Renal  . Prostate biopsy      Evidence  . Coronary angioplasty with stent placement  07/25/00    MI in 07/25/00 with RCA stenting at that time.  . Coronary angioplasty with stent placement  04/14/06    Bare metal stent in proximal LAD [facility] - 3.5 x16 stent) by Dr. Linard Millers.   Family History  Problem Relation Age of Onset  . CVA Mother   . Heart attack Father   . Kidney failure Father   . Heart disease Sister     Possible heart disease  . Aneurysm Sister   . Hypertension Brother   . Heart disease Brother   . Heart failure Brother   . Lung disease Brother   . Diabetes Brother    Social History  Substance Use Topics  . Smoking status: Former Smoker    Types: Cigars  . Smokeless tobacco: Never Used     Comment: quit 1990  . Alcohol Use: Yes     Comment: NOT ON A REGULAR BASIS      Review of Systems  Eyes: Negative for visual disturbance.  Respiratory: Negative for shortness of breath.   Cardiovascular: Negative for chest pain.  Gastrointestinal: Positive for vomiting. Negative for nausea,  abdominal pain, diarrhea and constipation.  Musculoskeletal: Positive for arthralgias. Negative for back pain and neck pain.  Skin: Positive for wound.  Neurological: Positive for headaches. Negative for dizziness, syncope, weakness, light-headedness and numbness.  All other systems reviewed and are negative.     Allergies  Vitamin d analogs; Ambien; and Honey  Home Medications   Prior to Admission medications   Medication Sig Start Date End Date Taking? Authorizing Provider  aspirin 81 MG chewable tablet Chew 81 mg by mouth daily.   Yes Historical Provider, MD  Coenzyme Q10 (COQ-10) 50 MG CAPS Take 1 capsule by mouth daily.    Yes Historical Provider, MD  lisinopril-hydrochlorothiazide (PRINZIDE,ZESTORETIC) 10-12.5 MG per tablet Take 1 tablet by mouth daily.    Yes Historical Provider, MD  nitroGLYCERIN (NITROSTAT) 0.4 MG SL tablet Place 0.4 mg under the  tongue every 5 (five) minutes as needed for chest pain. (MAXIMUM of 3 TABLETS)   Yes Historical Provider, MD  Omega-3 Fatty Acids (FISH OIL) 600 MG CAPS Take 600 mg by mouth daily.   Yes Historical Provider, MD  OVER THE COUNTER MEDICATION Take 3 capsules by mouth daily. OTC muscadine plus   Yes Historical Provider, MD  Red Yeast Rice 600 MG CAPS Take 1 capsule by mouth 2 (two) times daily.    Yes Historical Provider, MD  temazepam (RESTORIL) 30 MG capsule Take 30 mg by mouth at bedtime as needed for sleep.    Yes Historical Provider, MD    BP 153/83 mmHg  Pulse 75  Temp(Src) 98.1 F (36.7 C) (Oral)  Resp 24  SpO2 98% Physical Exam  Constitutional: He is oriented to person, place, and time. He appears well-developed and well-nourished. No distress.  HENT:  Head: Normocephalic. Head is without abrasion and without laceration.    Right Ear: External ear normal.  Left Ear: External ear normal.  Nose: Right sinus exhibits no maxillary sinus tenderness and no frontal sinus tenderness. Left sinus exhibits maxillary sinus tenderness. Left sinus exhibits no frontal sinus tenderness.  Mouth/Throat: Uvula is midline, oropharynx is clear and moist and mucous membranes are normal.  Periorbital ecchymosis to left eye. TTP to left lateral facial bones.  Eyes: Conjunctivae, EOM and lids are normal. Pupils are equal, round, and reactive to light. Right eye exhibits no discharge. Left eye exhibits no discharge. No scleral icterus.  Neck: Normal range of motion. Neck supple.  Cardiovascular: Normal rate, regular rhythm, normal heart sounds, intact distal pulses and normal pulses.   Pulmonary/Chest: Effort normal and breath sounds normal. No respiratory distress. He has no wheezes. He has no rales. He exhibits no tenderness.  Abdominal: Soft. Normal appearance and bowel sounds are normal. He exhibits no distension and no mass. There is no tenderness. There is no rigidity, no rebound and no guarding.    Musculoskeletal: Normal range of motion. He exhibits tenderness. He exhibits no edema.  Superficial abrasion to medial aspect of left elbow. No TTP. Full ROM. Mild TTP to left wrist. Full ROM. Mild TTP to right hand over 1st finger and over dorsal aspect of 4th and 5th MCPs with associated ecchymosis. Full ROM. Mild TTP to medial aspect of right ankle with overlying superficial abrasion. Full ROM. Otherwise, no TTP to upper or lower extremities bilaterally. Pelvis stable.  Neurological: He is alert and oriented to person, place, and time. He has normal strength. No cranial nerve deficit or sensory deficit. GCS eye subscore is 4. GCS verbal subscore is 5. GCS motor subscore is  6.  Skin: Skin is warm, dry and intact. No rash noted. He is not diaphoretic. No erythema. No pallor.  Psychiatric: He has a normal mood and affect. His speech is normal and behavior is normal.  Nursing note and vitals reviewed.   ED Course  Procedures (including critical care time)  Labs Review Labs Reviewed  CBC WITH DIFFERENTIAL/PLATELET  BASIC METABOLIC PANEL    Imaging Review Dg Wrist Complete Left  02/24/2016  CLINICAL DATA:  Initial evaluation for acute trauma, fall. EXAM: LEFT WRIST - COMPLETE 3+ VIEW COMPARISON:  None available. FINDINGS: No acute fracture or dislocation. Corticated osseous density at the dorsal aspect of the proximal hand on lateral projection appears chronic. Mild soft tissue swelling at the dorsal aspect of the hand. No other soft tissue abnormality. Osseous mineralization normal. Degenerative osteoarthrosis noted at the first [facility] joint. IMPRESSION: 1. No acute fracture dislocation. 2. Mild soft tissue swelling at the dorsal aspect of the hand/wrist. 3. Osseous fragment at the dorsal aspect of the proximal hand on lateral projection demonstrates corticated margins, and is favored to be chronic in nature. Electronically Signed   By: Jeannine Boga M.D.   On: 02/24/2016 06:00   Dg  Ankle Complete Right  02/24/2016  CLINICAL DATA:  Initial evaluation for acute trauma, fall. EXAM: RIGHT ANKLE - COMPLETE 3+ VIEW COMPARISON:  None. FINDINGS: No acute fracture or dislocation. Ankle mortise is approximated. Talar dome intact. No significant soft tissue swelling about the ankle. Vascular calcifications noted. Posterior calcaneal enthesophyte noted. Mild degenerate spurring at the dorsal talonavicular joint. IMPRESSION: No acute fracture or dislocation Electronically Signed   By: Jeannine Boga M.D.   On: 02/24/2016 06:03   Ct Head Wo Contrast  02/24/2016  CLINICAL DATA:  Slipped and fell at home. EXAM: CT HEAD WITHOUT CONTRAST CT MAXILLOFACIAL WITHOUT CONTRAST CT CERVICAL SPINE WITHOUT CONTRAST TECHNIQUE: Multidetector CT imaging of the head, cervical spine, and maxillofacial structures were performed using the standard protocol without intravenous contrast. Multiplanar CT image reconstructions of the cervical spine and maxillofacial structures were also generated. COMPARISON:  09/22/2010 FINDINGS: CT HEAD FINDINGS There is an acute left subdural hematoma in the anterior temporal and frontotemporal regions to a depth of 4-5 mm. There is no mass effect or midline shift. There is no intracranial hemorrhage. There is no intraventricular hemorrhage. There is mild to moderate generalized atrophy. There is extensive white matter hypodensity which is chronic and likely due to small vessel disease. The calvarium and skullbase are intact. CT MAXILLOFACIAL FINDINGS There is periorbital soft tissue swelling on the left. The globes appear intact. There is a nondisplaced fracture of the left zygomatic arch. There are nondisplaced fractures of the left maxillary sinus anterior posterior walls, as well as the left orbit lateral wall. Orbital floor is intact. Mandible and TMJ are intact. Pterygoid plates are intact. CT CERVICAL SPINE FINDINGS The vertebral column, pedicles and facet articulations are  intact. There is no evidence of acute fracture. No acute soft tissue abnormalities are evident. No significant arthritic changes are evident. IMPRESSION: 1. Acute left subdural hematoma to a depth of 4-5 mm, in the anterior temporal and from temporal regions. No significant mass effect or midline shift. 2. Unchanged atrophy and chronic white matter hypodensity. No acute intraparenchymal brain abnormality. 3. Nondisplaced left-sided facial fractures involving the lateral orbit, zygomatic arch and maxillary sinus. 4. Negative for acute cervical spine fracture. These results were called by telephone at the time of interpretation on 02/24/2016 at 5:38 am to  Dr. Bernerd Limbo , who verbally acknowledged these results. Electronically Signed   By: Andreas Newport M.D.   On: 02/24/2016 05:42   Ct Cervical Spine Wo Contrast  02/24/2016  CLINICAL DATA:  Slipped and fell at home. EXAM: CT HEAD WITHOUT CONTRAST CT MAXILLOFACIAL WITHOUT CONTRAST CT CERVICAL SPINE WITHOUT CONTRAST TECHNIQUE: Multidetector CT imaging of the head, cervical spine, and maxillofacial structures were performed using the standard protocol without intravenous contrast. Multiplanar CT image reconstructions of the cervical spine and maxillofacial structures were also generated. COMPARISON:  09/22/2010 FINDINGS: CT HEAD FINDINGS There is an acute left subdural hematoma in the anterior temporal and frontotemporal regions to a depth of 4-5 mm. There is no mass effect or midline shift. There is no intracranial hemorrhage. There is no intraventricular hemorrhage. There is mild to moderate generalized atrophy. There is extensive white matter hypodensity which is chronic and likely due to small vessel disease. The calvarium and skullbase are intact. CT MAXILLOFACIAL FINDINGS There is periorbital soft tissue swelling on the left. The globes appear intact. There is a nondisplaced fracture of the left zygomatic arch. There are nondisplaced fractures of the  left maxillary sinus anterior posterior walls, as well as the left orbit lateral wall. Orbital floor is intact. Mandible and TMJ are intact. Pterygoid plates are intact. CT CERVICAL SPINE FINDINGS The vertebral column, pedicles and facet articulations are intact. There is no evidence of acute fracture. No acute soft tissue abnormalities are evident. No significant arthritic changes are evident. IMPRESSION: 1. Acute left subdural hematoma to a depth of 4-5 mm, in the anterior temporal and from temporal regions. No significant mass effect or midline shift. 2. Unchanged atrophy and chronic white matter hypodensity. No acute intraparenchymal brain abnormality. 3. Nondisplaced left-sided facial fractures involving the lateral orbit, zygomatic arch and maxillary sinus. 4. Negative for acute cervical spine fracture. These results were called by telephone at the time of interpretation on 02/24/2016 at 5:38 am to Dr. Bernerd Limbo , who verbally acknowledged these results. Electronically Signed   By: Andreas Newport M.D.   On: 02/24/2016 05:42   Dg Hand Complete Right  02/24/2016  CLINICAL DATA:  Slipped and fell tonight. EXAM: RIGHT HAND - COMPLETE 3+ VIEW COMPARISON:  None. FINDINGS: There is a nondisplaced transverse fracture across the base of the first distal phalanx, probably sparing the articular surface. There is no dislocation. No other acute fracture is evident. There is no radiopaque foreign body. IMPRESSION: First distal phalangeal fracture, nondisplaced. Electronically Signed   By: Andreas Newport M.D.   On: 02/24/2016 06:01   Ct Maxillofacial Wo Cm  02/24/2016  CLINICAL DATA:  Slipped and fell at home. EXAM: CT HEAD WITHOUT CONTRAST CT MAXILLOFACIAL WITHOUT CONTRAST CT CERVICAL SPINE WITHOUT CONTRAST TECHNIQUE: Multidetector CT imaging of the head, cervical spine, and maxillofacial structures were performed using the standard protocol without intravenous contrast. Multiplanar CT image  reconstructions of the cervical spine and maxillofacial structures were also generated. COMPARISON:  09/22/2010 FINDINGS: CT HEAD FINDINGS There is an acute left subdural hematoma in the anterior temporal and frontotemporal regions to a depth of 4-5 mm. There is no mass effect or midline shift. There is no intracranial hemorrhage. There is no intraventricular hemorrhage. There is mild to moderate generalized atrophy. There is extensive white matter hypodensity which is chronic and likely due to small vessel disease. The calvarium and skullbase are intact. CT MAXILLOFACIAL FINDINGS There is periorbital soft tissue swelling on the left. The globes appear intact. There is a  nondisplaced fracture of the left zygomatic arch. There are nondisplaced fractures of the left maxillary sinus anterior posterior walls, as well as the left orbit lateral wall. Orbital floor is intact. Mandible and TMJ are intact. Pterygoid plates are intact. CT CERVICAL SPINE FINDINGS The vertebral column, pedicles and facet articulations are intact. There is no evidence of acute fracture. No acute soft tissue abnormalities are evident. No significant arthritic changes are evident. IMPRESSION: 1. Acute left subdural hematoma to a depth of 4-5 mm, in the anterior temporal and from temporal regions. No significant mass effect or midline shift. 2. Unchanged atrophy and chronic white matter hypodensity. No acute intraparenchymal brain abnormality. 3. Nondisplaced left-sided facial fractures involving the lateral orbit, zygomatic arch and maxillary sinus. 4. Negative for acute cervical spine fracture. These results were called by telephone at the time of interpretation on 02/24/2016 at 5:38 am to Dr. Bernerd Limbo , who verbally acknowledged these results. Electronically Signed   By: Andreas Newport M.D.   On: 02/24/2016 05:42   I have personally reviewed and evaluated these images and lab results as part of my medical decision-making.   EKG  Interpretation None      MDM   Final diagnoses:  Fall  Subdural hematoma ([place])  Facial fracture due to fall, closed, initial encounter [facility])    82 year old male presents after falling down stairs tonight prior to arrival. Reports headache and left sided facial pain. Denies LOC, dizziness, lightheadedness, vision changes, numbness, weakness, paresthesia, neck pain, back pain, chest pain, shortness of breath, abdominal pain. Reports one episode of emesis prior to arrival. Patient takes ASA daily, though is not on any additional anticoagulants. States his tetanus is up to date.   Patient is afebrile. Vital signs stable. GCS 15. Normal neuro exam with no focal deficit. Periorbital ecchymosis and TTP to left lateral facial bones. Heart RRR. Lungs clear to auscultation bilaterally. No TTP to chest wall. Abdomen soft, non-tender, non-distended. Patient moves all extremities without difficulty. MSK exam as above.   Skin tears (left elbow, right ankle) cleaned in the ED.  CT head remarkable for acute left subdural hematoma (4-5 mm) in the anterior temporal and frontotemporal regions without significant mass effect of midline shift. CT maxillofacial remarkable for nondisplaced left sided facial fractures involving the lateral orbit, zygomatic arch, and maxillary sinus. CT cervical spine negative for acute fracture. Imaging of left wrist negative for acute fracture or dislocation. Imaging of right hand remarkable for nondisplaced first distal phalangeal fracture. Imaging of right ankle negative for acute fracture or dislocation.   Trauma surgery and neurosurgery consulted.  Spoke with trauma surgery (Dr. Kieth Brightly) - patient to be admitted for further evaluation and management. Spoke with neurosurgery (Dr. Annette Stable) - who advised nothing to do right now other than observe and neurosurgery will see the patient today.  Patient given tylenol per request for his headache. Patient discussed with Dr. Claudine Mouton.  BP  153/83 mmHg  Pulse 75  Temp(Src) 98.1 F (36.7 C) (Oral)  Resp 24  SpO2 98%     Marella Chimes, PA-C 02/24/16 [id number]  Everlene Balls, MD 02/24/16 [phone]

## 2016-02-24 NOTE — ED Notes (Signed)
Per family, pt was up and slipped this evening at home, has hematoma to left eye, endorses headache, nausea. Alert and oriented answering all questions approrpaitely. Skin tear to left forearm, right ankle.

## 2016-02-24 NOTE — Progress Notes (Signed)
Patient arrived to 5M14 at 95, transfer from [place]. Patient alert and oriented X4 with no c/o pain. Vital signs taken and charted. Telemetry applied and CCMD notified. Oriented to room with bed alarm on . Family at bedside.

## 2016-02-24 NOTE — Care Management Note (Signed)
Case Management Note  Patient Details  Name: Brandon Robles MRN: [id number] Date of Birth: 04/16/1926  Subjective/Objective:    Pt admitted on 02/24/16 s/p fall down stairs with small SDH.  PTA, pt independent, lives with and cares for spouse.  Pt states daughter lives next door, son in town.  Both are supportive.                  Action/Plan: Will follow for discharge planning as pt progresses.      Expected Discharge Date:                  Expected Discharge Plan:  Home/Self Care  In-House Referral:     Discharge planning Services  CM Consult  Post Acute Care Choice:    Choice offered to:     DME Arranged:    DME Agency:     HH Arranged:    HH Agency:     Status of Service:  In process, will continue to follow  Medicare Important Message Given:    Date Medicare IM Given:    Medicare IM give by:    Date Additional Medicare IM Given:    Additional Medicare Important Message give by:     If discussed at [place] of Stay Meetings, dates discussed:    Additional Comments:  Reinaldo Raddle, RN, BSN  Trauma/Neuro ICU Case Manager [phone]

## 2016-02-25 ENCOUNTER — Inpatient Hospital Stay (HOSPITAL_COMMUNITY): Payer: Medicare Other

## 2016-02-25 DIAGNOSIS — S0292XA Unspecified fracture of facial bones, initial encounter for closed fracture: Secondary | ICD-10-CM

## 2016-02-25 DIAGNOSIS — W19XXXA Unspecified fall, initial encounter: Secondary | ICD-10-CM | POA: Diagnosis present

## 2016-02-25 DIAGNOSIS — S62501A Fracture of unspecified phalanx of right thumb, initial encounter for closed fracture: Secondary | ICD-10-CM | POA: Diagnosis present

## 2016-02-25 HISTORY — DX: Unspecified fracture of facial bones, initial encounter for closed fracture: S02.92XA

## 2016-02-25 LAB — BASIC METABOLIC PANEL
Anion gap: 10 (ref 5–15)
BUN: 18 mg/dL (ref 6–20)
CHLORIDE: 103 mmol/L (ref 101–111)
CO2: 24 mmol/L (ref 22–32)
CREATININE: 1.06 mg/dL (ref 0.61–1.24)
Calcium: 8.7 mg/dL — ABNORMAL LOW (ref 8.9–10.3)
GFR calc Af Amer: >60 mL/min (ref >60)
GFR calc non Af Amer: >60 mL/min (ref >60)
GLUCOSE: 101 mg/dL — AB (ref 65–99)
POTASSIUM: 3.9 mmol/L (ref 3.5–5.1)
Sodium: 137 mmol/L (ref 135–145)

## 2016-02-25 LAB — CBC
HEMATOCRIT: 35.7 % — AB (ref 39.0–52.0)
Hemoglobin: 12 g/dL — ABNORMAL LOW (ref 13.0–17.0)
MCH: 31.9 pg (ref 26.0–34.0)
MCHC: 33.6 g/dL (ref 30.0–36.0)
MCV: 94.9 fL (ref 78.0–100.0)
PLATELETS: 206 10*3/uL (ref 150–400)
RBC: 3.76 MIL/uL — ABNORMAL LOW (ref 4.22–5.81)
RDW: 13.6 % (ref 11.5–15.5)
WBC: 10.9 10*3/uL — ABNORMAL HIGH (ref 4.0–10.5)

## 2016-02-25 MED ORDER — ACETAMINOPHEN 325 MG PO TABS: 650.0000 mg | ORAL_TABLET | ORAL | Status: AC | PRN

## 2016-02-25 MED ORDER — DOCUSATE SODIUM 100 MG PO CAPS: 100.0000 mg | ORAL_CAPSULE | Freq: Two times a day (BID) | ORAL | Status: DC

## 2016-02-25 NOTE — Discharge Summary (Signed)
Physician Discharge Summary  Brandon Robles [id number] DOB: 18-Dec-1925 DOA: 02/24/2016  PCP: Irven Shelling, MD  Consultation: [facility]---Dr. Annette Stable   Admit date: 02/24/2016 Discharge date: 02/25/2016  Recommendations for Outpatient Follow-up:    Follow-up Information    Call Charlie Pitter, MD.   Specialty:  Neurosurgery   Why:  to schedule a follow up for your brain injury, a subdural hematoma   Contact information:   [address] [address] [place] [address] [phone]       Call Melida Quitter, MD.   Specialty:  Otolaryngology   Why:  to schedule a follow up for your face fractures   Contact information:   [address] [place] [address] [phone]       Call [name].   Specialties:  Orthopedic Surgery, Sports Medicine   Why:  to schedule a follow up for your finger fracture   Contact information:   [address] [place] [address] [phone]       Follow up with [facility].   Why:  As needed   Contact information:   [place] [phone]     Discharge Diagnoses:  1. Fall 2. TBI 3. Left facial fractures 4. Right hand distal phalanx fracture    Discharge Condition: stable Disposition: home  Diet recommendation: regular  Filed Weights   02/24/16 0815  Weight: 73.6 kg (162 lb 4.1 oz)     Filed Vitals:   02/25/16 0530 02/25/16 0947  BP: 110/52 101/66  Pulse:  72  Temp:  98 F (36.7 C)  Resp:  18     Hospital Course:  Brandon Robles is a 82 year old male who presented to the ED following a fall.  He was found to have a minimal subdural hematoma for which Dr. Annette Stable was consulted for.  He recommended a repeat CT which remained stable, 35mm residual left subdural hematoma.  He was also found to have a right hand distal phalanx fracture for which Dr. Grandville Silos was consulted for and recommended an outpatient follow up.   Lastly, left facial fractures for which Dr. Redmond Baseman was consulted who also recommended outpatient follow up. On HD#2 he was felt stable for discharge home with above follow up.  Follow up in our clinic as needed.    Discharge Instructions     Medication List    TAKE these medications        acetaminophen 325 MG tablet  Commonly known as:  TYLENOL  Take 2 tablets (650 mg total) by mouth every 4 (four) hours as needed for mild pain.     aspirin 81 MG chewable tablet  Chew 81 mg by mouth daily.     CoQ-10 50 MG Caps  Take 1 capsule by mouth daily.     docusate sodium 100 MG capsule  Commonly known as:  COLACE  Take 1 capsule (100 mg total) by mouth 2 (two) times daily.     Fish Oil 600 MG Caps  Take 600 mg by mouth daily.     lisinopril-hydrochlorothiazide 10-12.5 MG tablet  Commonly known as:  PRINZIDE,ZESTORETIC  Take 1 tablet by mouth daily.     nitroGLYCERIN 0.4 MG SL tablet  Commonly known as:  NITROSTAT  Place 0.4 mg under the tongue every 5 (five) minutes as needed for chest pain. (MAXIMUM of 3 TABLETS)     OVER THE COUNTER MEDICATION  Take 3  capsules by mouth daily. OTC muscadine plus     Red Yeast Rice 600 MG Caps  Take 1 capsule by mouth 2 (two) times daily.     temazepam 30 MG capsule  Commonly known as:  RESTORIL  Take 30 mg by mouth at bedtime as needed for sleep.           Follow-up Information    Call Charlie Pitter, MD.   Specialty:  Neurosurgery   Contact information:   [address] [place] [address] [phone]        The results of significant diagnostics from this hospitalization (including imaging, microbiology, ancillary and laboratory) are listed below for reference.    Significant Diagnostic Studies: Dg Wrist Complete Left  02-25-2016  CLINICAL DATA:  Initial evaluation for acute trauma, fall. EXAM: LEFT WRIST - COMPLETE 3+ VIEW COMPARISON:  None available. FINDINGS: No acute fracture or dislocation. Corticated  osseous density at the dorsal aspect of the proximal hand on lateral projection appears chronic. Mild soft tissue swelling at the dorsal aspect of the hand. No other soft tissue abnormality. Osseous mineralization normal. Degenerative osteoarthrosis noted at the first [employer] [facility] [employer] joint. IMPRESSION: 1. No acute fracture dislocation. 2. Mild soft tissue swelling at the dorsal aspect of the hand/wrist. 3. Osseous fragment at the dorsal aspect of the proximal hand on lateral projection demonstrates corticated margins, and is favored to be chronic in nature. Electronically Signed   By: Jeannine Boga M.D.   On: 02-25-16 06:00   Dg Ankle Complete Right  Feb 25, 2016  CLINICAL DATA:  Initial evaluation for acute trauma, fall. EXAM: RIGHT ANKLE - COMPLETE 3+ VIEW COMPARISON:  None. FINDINGS: No acute fracture or dislocation. Ankle mortise is approximated. Talar dome intact. No significant soft tissue swelling about the ankle. Vascular calcifications noted. Posterior calcaneal enthesophyte noted. Mild degenerate spurring at the dorsal talonavicular joint. IMPRESSION: No acute fracture or dislocation Electronically Signed   By: Jeannine Boga M.D.   On: 2016-02-25 06:03   Ct Head Wo Contrast  02/25/2016  CLINICAL DATA:  Fall down steps, small amount of intracranial hemorrhage EXAM: CT HEAD WITHOUT CONTRAST TECHNIQUE: Contiguous axial images were obtained from the base of the skull through the vertex without intravenous contrast. COMPARISON:  02/25/16 FINDINGS: Minimal residual subdural hematoma underlying the left frontal convexity, measuring 2 mm in thickness (series 201/image 16). No evidence of parenchymal hemorrhage. No mass effect or midline shift. No CT evidence of acute infarction. Subcortical white matter and periventricular small vessel ischemic changes. Intracranial atherosclerosis. Mild cortical atrophy. No ventriculomegaly. No evidence of intraventricular hemorrhage. Near complete opacification of the  left ethmoid and maxillary sinuses. Mastoid air cells are clear. Fractures of the lateral left orbit and zygomatic arch. Known left maxillary sinus fractures are better visualized on recent maxillofacial CT. No evidence of calvarial fracture. IMPRESSION: 2 mm residual left subdural hematoma. No mass effect or midline shift.  No intraventricular hemorrhage. Fractures of the lateral left orbit and zygomatic arch. Known left maxillary sinus fractures are better visualized on recent maxillofacial CT. Electronically Signed   By: Julian Hy M.D.   On: 02/25/2016 12:38   Ct Head Wo Contrast  2016-02-25  CLINICAL DATA:  Slipped and fell at home. EXAM: CT HEAD WITHOUT CONTRAST CT MAXILLOFACIAL WITHOUT CONTRAST CT CERVICAL SPINE WITHOUT CONTRAST TECHNIQUE: Multidetector CT imaging of the head, cervical spine, and maxillofacial structures were performed using the standard protocol without intravenous contrast. Multiplanar CT image reconstructions of the cervical spine and  maxillofacial structures were also generated. COMPARISON:  09/22/2010 FINDINGS: CT HEAD FINDINGS There is an acute left subdural hematoma in the anterior temporal and frontotemporal regions to a depth of 4-5 mm. There is no mass effect or midline shift. There is no intracranial hemorrhage. There is no intraventricular hemorrhage. There is mild to moderate generalized atrophy. There is extensive white matter hypodensity which is chronic and likely due to small vessel disease. The calvarium and skullbase are intact. CT MAXILLOFACIAL FINDINGS There is periorbital soft tissue swelling on the left. The globes appear intact. There is a nondisplaced fracture of the left zygomatic arch. There are nondisplaced fractures of the left maxillary sinus anterior posterior walls, as well as the left orbit lateral wall. Orbital floor is intact. Mandible and TMJ are intact. Pterygoid plates are intact. CT CERVICAL SPINE FINDINGS The vertebral column, pedicles and  facet articulations are intact. There is no evidence of acute fracture. No acute soft tissue abnormalities are evident. No significant arthritic changes are evident. IMPRESSION: 1. Acute left subdural hematoma to a depth of 4-5 mm, in the anterior temporal and from temporal regions. No significant mass effect or midline shift. 2. Unchanged atrophy and chronic white matter hypodensity. No acute intraparenchymal brain abnormality. 3. Nondisplaced left-sided facial fractures involving the lateral orbit, zygomatic arch and maxillary sinus. 4. Negative for acute cervical spine fracture. These results were called by telephone at the time of interpretation on 02/24/2016 at 5:38 am to Dr. Bernerd Limbo , who verbally acknowledged these results. Electronically Signed   By: Andreas Newport M.D.   On: 02/24/2016 05:42   Ct Cervical Spine Wo Contrast  02/24/2016  CLINICAL DATA:  Slipped and fell at home. EXAM: CT HEAD WITHOUT CONTRAST CT MAXILLOFACIAL WITHOUT CONTRAST CT CERVICAL SPINE WITHOUT CONTRAST TECHNIQUE: Multidetector CT imaging of the head, cervical spine, and maxillofacial structures were performed using the standard protocol without intravenous contrast. Multiplanar CT image reconstructions of the cervical spine and maxillofacial structures were also generated. COMPARISON:  09/22/2010 FINDINGS: CT HEAD FINDINGS There is an acute left subdural hematoma in the anterior temporal and frontotemporal regions to a depth of 4-5 mm. There is no mass effect or midline shift. There is no intracranial hemorrhage. There is no intraventricular hemorrhage. There is mild to moderate generalized atrophy. There is extensive white matter hypodensity which is chronic and likely due to small vessel disease. The calvarium and skullbase are intact. CT MAXILLOFACIAL FINDINGS There is periorbital soft tissue swelling on the left. The globes appear intact. There is a nondisplaced fracture of the left zygomatic arch. There are  nondisplaced fractures of the left maxillary sinus anterior posterior walls, as well as the left orbit lateral wall. Orbital floor is intact. Mandible and TMJ are intact. Pterygoid plates are intact. CT CERVICAL SPINE FINDINGS The vertebral column, pedicles and facet articulations are intact. There is no evidence of acute fracture. No acute soft tissue abnormalities are evident. No significant arthritic changes are evident. IMPRESSION: 1. Acute left subdural hematoma to a depth of 4-5 mm, in the anterior temporal and from temporal regions. No significant mass effect or midline shift. 2. Unchanged atrophy and chronic white matter hypodensity. No acute intraparenchymal brain abnormality. 3. Nondisplaced left-sided facial fractures involving the lateral orbit, zygomatic arch and maxillary sinus. 4. Negative for acute cervical spine fracture. These results were called by telephone at the time of interpretation on 02/24/2016 at 5:38 am to Dr. Bernerd Limbo , who verbally acknowledged these results. Electronically Signed   By: Quillian Quince  Armandina Stammer M.D.   On: 02/24/2016 05:42   Dg Hand Complete Right  02/24/2016  CLINICAL DATA:  Slipped and fell tonight. EXAM: RIGHT HAND - COMPLETE 3+ VIEW COMPARISON:  None. FINDINGS: There is a nondisplaced transverse fracture across the base of the first distal phalanx, probably sparing the articular surface. There is no dislocation. No other acute fracture is evident. There is no radiopaque foreign body. IMPRESSION: First distal phalangeal fracture, nondisplaced. Electronically Signed   By: Andreas Newport M.D.   On: 02/24/2016 06:01   Ct Maxillofacial Wo Cm  02/24/2016  CLINICAL DATA:  Slipped and fell at home. EXAM: CT HEAD WITHOUT CONTRAST CT MAXILLOFACIAL WITHOUT CONTRAST CT CERVICAL SPINE WITHOUT CONTRAST TECHNIQUE: Multidetector CT imaging of the head, cervical spine, and maxillofacial structures were performed using the standard protocol without intravenous contrast.  Multiplanar CT image reconstructions of the cervical spine and maxillofacial structures were also generated. COMPARISON:  09/22/2010 FINDINGS: CT HEAD FINDINGS There is an acute left subdural hematoma in the anterior temporal and frontotemporal regions to a depth of 4-5 mm. There is no mass effect or midline shift. There is no intracranial hemorrhage. There is no intraventricular hemorrhage. There is mild to moderate generalized atrophy. There is extensive white matter hypodensity which is chronic and likely due to small vessel disease. The calvarium and skullbase are intact. CT MAXILLOFACIAL FINDINGS There is periorbital soft tissue swelling on the left. The globes appear intact. There is a nondisplaced fracture of the left zygomatic arch. There are nondisplaced fractures of the left maxillary sinus anterior posterior walls, as well as the left orbit lateral wall. Orbital floor is intact. Mandible and TMJ are intact. Pterygoid plates are intact. CT CERVICAL SPINE FINDINGS The vertebral column, pedicles and facet articulations are intact. There is no evidence of acute fracture. No acute soft tissue abnormalities are evident. No significant arthritic changes are evident. IMPRESSION: 1. Acute left subdural hematoma to a depth of 4-5 mm, in the anterior temporal and from temporal regions. No significant mass effect or midline shift. 2. Unchanged atrophy and chronic white matter hypodensity. No acute intraparenchymal brain abnormality. 3. Nondisplaced left-sided facial fractures involving the lateral orbit, zygomatic arch and maxillary sinus. 4. Negative for acute cervical spine fracture. These results were called by telephone at the time of interpretation on 02/24/2016 at 5:38 am to Dr. Bernerd Limbo , who verbally acknowledged these results. Electronically Signed   By: Andreas Newport M.D.   On: 02/24/2016 05:42    Microbiology: Recent Results (from the past 240 hour(s))  MRSA PCR Screening     Status: None    Collection Time: 02/24/16  8:10 AM  Result Value Ref Range Status   MRSA by PCR NEGATIVE NEGATIVE Final    Comment:        The GeneXpert MRSA Assay (FDA approved for NASAL specimens only), is one component of a comprehensive MRSA colonization surveillance program. It is not intended to diagnose MRSA infection nor to guide or monitor treatment for MRSA infections.      Labs: Basic Metabolic Panel:  Recent Labs Lab 02/24/16 0600 02/25/16 0635  NA 142 137  K 3.9 3.9  CL 105 103  CO2 26 24  GLUCOSE 147* 101*  BUN 22* 18  CREATININE 1.18 1.06  CALCIUM 9.3 8.7*   Liver Function Tests: No results for input(s): AST, ALT, ALKPHOS, BILITOT, PROT, ALBUMIN in the last 168 hours. No results for input(s): LIPASE, AMYLASE in the last 168 hours. No results for input(s):  AMMONIA in the last 168 hours. CBC:  Recent Labs Lab 02/24/16 0600 02/25/16 0932  WBC 12.8* 10.9*  NEUTROABS 11.2*  --   HGB 12.1* 12.0*  HCT 36.0* 35.7*  MCV 94.7 94.9  PLT 207 206   Cardiac Enzymes: No results for input(s): CKTOTAL, CKMB, CKMBINDEX, TROPONINI in the last 168 hours. BNP: BNP (last 3 results) No results for input(s): BNP in the last 8760 hours.  ProBNP (last 3 results) No results for input(s): PROBNP in the last 8760 hours.  CBG:  Recent Labs Lab 02/24/16 0839  GLUCAP 173*    Active Problems:   Traumatic subdural hematoma (HCC)   Fall   Multiple facial fractures (HCC)   Fracture of phalanx of right thumb   Time coordinating discharge: <30 mins   Signed:  Bostyn Kunkler, ANP-BC

## 2016-02-25 NOTE — Discharge Instructions (Signed)
Subdural Hematoma A subdural hematoma is a collection of blood between the brain and its tough outermost membrane covering (the dura). Blood clots that form in this area push down on the brain and cause irritation. A subdural hematoma may cause parts of the brain to stop working and eventually cause death.  CAUSES A subdural hematoma is caused by bleeding from a ruptured blood vessel (hemorrhage). The bleeding results from trauma to the head, such as from a fall or motor vehicle accident. There are two types of subdural hemorrhages:  Acute. This type develops shortly after a serious blow to the head and causes blood to collect very quickly. If not diagnosed and treated promptly, severe brain injury or death can occur.  Chronic. This is when bleeding develops more slowly, over weeks or months. RISK FACTORS People at risk for subdural hematoma include older persons, infants, and alcoholics. SYMPTOMS An acute subdural hemorrhage develops over minutes to hours. Symptoms can include:  Temporary loss of consciousness.  Weakness of arms or legs on one side of the body.  Changes in vision or speech.  A severe headache.  Seizures.  Nausea and vomiting.  Increased sleepiness. A chronic subdural hemorrhage develops over weeks to months. Symptoms may develop slowly and produce less noticeable problems or changes. Symptoms include:  A mild headache.  A change in personality.  Loss of balance or difficulty walking.  Weakness, numbness, or tingling in the arms or legs.  Nausea or vomiting.  Memory loss.  Double vision.  Increased sleepiness. DIAGNOSIS Your health care provider will perform a thorough physical and neurological exam. A CT scan or MRI may also be done. If there is blood on the scan, its color will help your health care provider determine how long the hemorrhage has been there. TREATMENT If the cause is an acute subdural hemorrhage, immediate treatment is needed. In many  cases an emergency surgery is performed to drain accumulated blood or to remove the blood clot. Sometimes steroid or diuretic medicines or controlled breathing through a ventilator is needed to decrease pressure in the brain. This is especially true if there is any swelling of the brain. If the cause is a chronic subdural hemorrhage, treatment depends on a variety of factors. Sometimes no treatment is needed. If the subdural hematoma is small and causes minimal or no symptoms, you may be treated with bed rest, medicines, and observation. If the hemorrhage is large or if you have neurological symptoms, an emergency surgery is usually needed to remove the blood clot. People who develop a subdural hemorrhage are at risk of developing seizures, even after the subdural hematoma has been treated. You may be prescribed an anti-seizure (anticonvulsant) medicine for a year or longer. HOME CARE INSTRUCTIONS  Only take medicines as directed by your health care provider.  Rest if directed by your health care provider.  Keep all follow-up appointments with your health care provider.  If you play a contact sport such as football, hockey or soccer and you experienced a significant head injury, allow enough time for healing (up to 15 days) before you start playing again. A repeated injury that occurs during this fragile repair period is likely to result in hemorrhage. This is called the second impact syndrome. SEEK IMMEDIATE MEDICAL CARE IF:  You fall or experience minor trauma to your head and you are taking blood thinners. If you are on any blood thinners even a very small injury can cause a subdural hematoma. You should not hesitate to seek  medical attention regardless of how minor you think your symptoms are.  You experience a head injury and have:  Drowsiness or a decrease in alertness.  Confusion or forgetfulness.  Slurred speech.  Irrational or aggressive behavior.  Numbness or paralysis in any part  of the body.  A feeling of being sick to your stomach (nauseous) or you throw up (vomit).  Difficulty walking or poor coordination.  Double vision.  Seizures.  A bleeding disorder.  A history of heavy alcohol use.  Clear fluid draining from your nose or ears.  Personality changes.  Difficulty thinking.  Worsening symptoms. MAKE SURE YOU:  Understand these instructions.  Will watch your condition.  Will get help right away if you are not doing well or get worse. FOR MORE INFORMATION National Institute of Neurological Disorders and Stroke: [place]Automobile.co.ukwww.ninds.nih.gov American Association of Neurological Surgeons: www.neurosurgerytoday.org American Academy of Neurology (AAN): ComparePet.czwww.aan.com Brain Injury Association of America: www.biausa.org   This information is not intended to replace advice given to you by your health care provider. Make sure you discuss any questions you have with your health care provider.   Document Released: 08/20/2004 Document Revised: 07/24/2013 Document Reviewed: 04/05/2013 Elsevier Interactive Patient Education 2016 Elsevier Inc. Traumatic Brain Injury Traumatic brain injury (TBI) is an injury to your brain from a blow to your head (closed injury) or an object penetrating your skull and entering your brain (open injury). The severity of TBI varies significantly from one person to the next. Some TBIs cause you to pass out (lose consciousness) immediately and for a long period of time. Other TBIs do not cause any loss of consciousness.  Symptoms of any type of TBI can be long lasting (chronic). TBI can interfere with memory and speech. TBI can also cause chronic symptoms like headache or dizziness. CAUSES  TBI is caused by a closed or open injury. RISK FACTORS You may be at higher risk for TBI if you:  Are 75 or older.  Are a man.  Are in a car accident.  Play a contact sport, especially football, hockey, or soccer.  Do not wear protective gear while  playing sports.  Are in the [employer] and Compan[employer]itary.  Are a victim of violence.  Abuse drugs or alcohol.  Have had a previous TBI. SIGNS AND SYMPTOMS  Signs and symptoms of TBI may occur right away or not until days, weeks, or months after the injury. They may last for days, weeks, months, or years. Symptoms may include:  Loss of consciousness.  Headache.  Confusion.  Fatigue.  Changes in sleep.  Dizziness.  Mood or personality changes.  Memory problems.  Nausea or vomiting or both.  Seizures.  Clumsiness.  Slurred speech.  Depression and anxiety.  Anger.  Inability to control emotions or actions (impulse control).  Loss of or dulling of your senses, such as hearing, vision, and touch. This can include:  Blurred vision.  Ringing in your ears. DIAGNOSIS  TBI may be diagnosed by a medical history and physical exam. Your health care provider will also do a neurologic exam to check your:  Reflexes.  Sensations.  Alertness.  Memory.  Vision.  Hearing.  Coordination. Your health care provider will also do tests to diagnose the extent of your TBI, such as a CT scan of your brain and skull. One way to determine the severity of your TBI is with a scoring system called the Glasgow Coma Scale (GCS). It measures eye opening, motor response, and verbal response. The higher the score, the  milder the TBI.  Your TBI may be described as mild, moderate, or severe:   Mild TBI (concussion).  Symptoms of mild TBI usually go away on their own. This can take weeks or months, depending on the type of concussion.  Your GCS will be 13-15.  Your brain CT scan will be normal.  You may or may not have a short hospital stay.  Moderate TBI.  Your GCS will be 9-12.  Your brain CT scan will be abnormal.  You will likely need a short hospital stay.  Severe TBI.  Your GCS will be 3-8.  Your brain CT scan will be abnormal.  You may need a long stay in the  hospital. TREATMENT Emergency treatment of TBI may involve measures to maintain a clear airway and stable blood pressure. Brain surgery may be needed to:  Remove a blood clot.  Repair bleeding.  Remove an object that has penetrated the brain, such as a skull fragment or a bullet. Other treatments depend on your chronic signs and symptoms. These treatments include the following types of therapy:  Physical.  Occupational.  Speech and language.  Mental health.  Social support. HOME CARE INSTRUCTIONS  Carefully follow all your health care provider's instructions.  Work closely with all your therapists, if necessary.   Take medicines only as directed by your health care provider. Do not take aspirin or other anti-inflammatory medicines such as ibuprofen or naproxen unless approved by your health care provider.  Do not abuse illegal drugs.   Limit alcohol intake to no more than 1 drink per day for nonpregnant women and 2 drinks per day for men. One drink equals 12 ounces of beer, 5 ounces of wine, or 1 ounces of hard liquor.  Avoid any situation where there is potential for another head injury, such as football, hockey, soccer, basketball, martial arts, downhill snow sports, and horseback riding. Do not do these activities until your health care provider approves.   Rest. Rest helps the brain to heal. Make sure you:   Get plenty of sleep at night. Avoid staying up late at night.   Keep the same bedtime hours on weekends and weekdays.   Rest during the day. Take daytime naps or rest breaks when you feel tired.  Avoid excessive visual stimulation while recovering from a TBI. This includes work on the computer, watching TV, and reading.  Try to avoid activities that cause physical or mental stress. Stay home from work or school as directed by your health care provider.  Make lists to plan your day and help your memory.  Do not drive, ride a bicycle, or operate heavy  machinery until your health care provider approves.  Seek support from friends and family.  Keep all follow-up visits as directed by your health care provider. This is important.  Watch your symptoms and tell others to do the same. Complications sometimes occur after a TBI. PREVENTION   Wear a helmet while biking, skiing, skateboarding, skating, or doing similar activities. Wear your seatbelt while driving.  Do not abuse alcohol or drugs.  Do not drink and drive.  Prevent falls at home by:  Removing clutter and tripping hazards, including loose rugs, from floors and stairways.  Using grab bars in bathrooms and handrails by stairs.   Placing nonslip mats on floors and in bathtubs.   Improving lighting in dim areas.  SEEK MEDICAL CARE IF: Seek medical care if you have any of the following symptoms for more than two  weeks after your injury:  Chronic headaches.   Dizziness or balance problems.   Nausea.   Vision problems.   Increased sensitivity to noise or light.   Depression or mood swings.   Anxiety or irritability.   Memory problems.   Difficulty concentrating or paying attention.   Sleep problems.   Feeling tired all the time.  SEEK IMMEDIATE MEDICAL CARE IF:  You have confusion or unusual drowsiness.  It is difficult to wake you up.   You have nausea or persistent, forceful vomiting.   You feel like you are moving when you are not (vertigo). Your eyes may move rapidly back and forth.  You have convulsions or faint.   You have severe, persistent headaches that are not relieved by medicine.   You cannot use your arms or legs normally.   One of your pupils is larger than the other.   You have clear or bloody discharge from your nose or ears.  Your problems are getting worse, not better.    This information is not intended to replace advice given to you by your health care provider. Make sure you discuss any questions you  have with your health care provider.   Document Released: 09/23/2002 Document Revised: 10/24/2014 Document Reviewed: 01/16/2014 Elsevier Interactive Patient Education [employer].

## 2016-02-25 NOTE — Progress Notes (Signed)
Doing well. No new problems

## 2016-02-25 NOTE — Progress Notes (Signed)
Patient ID: Brandon Robles, male   DOB: [birth year], 82 y.o.   MRN: [id number]   LOS: 1 day   Subjective: Doing well, no c/o. Eager for discharge.   Objective: Vital signs in last 24 hours: Temp:  [98 F (36.7 C)-99.7 F (37.6 C)] 98 F (36.7 C) (05/11 0947) Pulse Rate:  [48-75] 72 (05/11 0947) Resp:  [15-20] 18 (05/11 0947) BP: (84-135)/(39-68) 101/66 mmHg (05/11 0947) SpO2:  [91 %-99 %] 97 % (05/11 0947) Last BM Date: 02/23/16   Laboratory  BMET  Recent Labs  02/24/16 0600 02/25/16 0635  NA 142 137  K 3.9 3.9  CL 105 103  CO2 26 24  GLUCOSE 147* 101*  BUN 22* 18  CREATININE 1.18 1.06  CALCIUM 9.3 8.7*    Physical Exam General appearance: alert and no distress Resp: clear to auscultation bilaterally Cardio: regular rate and rhythm GI: normal findings: bowel sounds normal and soft, non-tender NVI   Assessment/Plan: Fall TBI -- per Dr. Annette Stable, stable Left facial fxs -- OP f/u per Dr. Redmond Baseman Right hand distal phalanx fx -- Hand [name]) never came and saw pt, plan on OP f/u Multiple medical problems -- Home meds FEN -- No issues VTE -- SCD's Dispo -- D/C home today if head CT stable. Pt feels fine getting up, declines PT eval, has walkers at home.    Lisette Abu, PA-C Pager: [phone] General Trauma PA Pager: [phone]  02/25/2016

## 2016-02-25 NOTE — Progress Notes (Signed)
Pt to be discharged to home with wife and daughter. Given all discharge instructions and pt understood all of instructions.

## 2016-02-25 NOTE — Evaluation (Signed)
Physical Therapy Evaluation Patient Details Name: Brandon Robles MRN: [id number] DOB: 02-12-1926 Today's Date: 02/25/2016   History of Present Illness  Brandon Robles is a 82 y.o. male with a PMH of SA dysfunction, LBBB, HTN, HLD, CAD who presents to the ED s/p fall. He states he woke up to get a glass of milk, took a sleeping pill, and fell down the stairs in his home.  Found to have R 1st distal metacarpal fracture, L frontotemporal SDH and left sided facial fractures involving the lateral orbit, zygomatic arch, and maxillary sinus.  Clinical Impression  Patient presents with mobility safe for d/c home with family support.  Has needed DME and no current follow up PT needs at this time.  Educated in precautions for home including need to stay on main level until eye swelling and leg pain better as well as not to drive, but reports he will anyway.    Follow Up Recommendations No PT follow up    Equipment Recommendations  None recommended by PT    Recommendations for Other Services       Precautions / Restrictions Precautions Precautions: Fall Required Braces or Orthoses: Other Brace/Splint Other Brace/Splint: R thumb spica      Mobility  Bed Mobility Overal bed mobility: Modified Independent                Transfers Overall transfer level: Needs assistance   Transfers: Sit to/from Stand Sit to Stand: Supervision         General transfer comment: no physical help, just needed S for safety  Ambulation/Gait Ambulation/Gait assistance: Supervision Ambulation Distance (Feet): 125 Feet Assistive device: Rolling walker (2 wheeled) Gait Pattern/deviations: Step-through pattern;Decreased stride length;Antalgic;Decreased stance time - left     General Gait Details: sore L calf, and walker due to pain  Stairs Stairs: Yes Stairs assistance: Supervision Stair Management: Two rails;Step to pattern;Forwards Number of Stairs: 3 General stair comments: cues for pattern due to  sore L LE; no physical help needed  Wheelchair Mobility    Modified Rankin (Stroke Patients Only)       Balance Overall balance assessment: Needs assistance   Sitting balance-Leahy Scale: Good       Standing balance-Leahy Scale: Good Standing balance comment: stands unsupported 1 minute, feet together 30 sec, eyes closed 10 sec and able to look over shoulder R and L no LOB, lifted L foot attempting SLS no LOB, but uanble to hold                             Pertinent Vitals/Pain Pain Assessment: Faces Faces Pain Scale: Hurts little more Pain Location: L calf with ambulation Pain Descriptors / Indicators: Sore Pain Intervention(s): Limited activity within patient's tolerance;Monitored during session    Home Living Family/patient expects to be discharged to:: Private residence Living Arrangements: Spouse/significant other;Children   Type of Home: House Home Access: Stairs to enter Entrance Stairs-Rails: Psychiatric nurse of Steps: 5 Home Layout: Two level Home Equipment: Environmental consultant - 2 wheels;Walker - 4 wheels;Grab bars - tub/shower;Shower seat;Cane - single point Additional Comments: daughter lives with them, but works, spouse was needing assist for stairs (back surgery in Dec, and hip fx prior to that)    Prior Function Level of Independence: Independent         Comments: still works on farm, drives, Comptroller        Extremity/Trunk Assessment  Upper Extremity Assessment: RUE deficits/detail;LUE deficits/detail RUE Deficits / Details: stiff in hand with swelling and spika on thumb     LUE Deficits / Details: stiff and swollen, pt reports ("it is sprung")   Lower Extremity Assessment: Overall WFL for tasks assessed      Cervical / Trunk Assessment: Kyphotic;Other exceptions  Communication   Communication: HOH  Cognition Arousal/Alertness: Awake/alert Behavior During Therapy: WFL for tasks  assessed/performed Overall Cognitive Status: Within Functional Limits for tasks assessed                      General Comments General comments (skin integrity, edema, etc.): L eye swollen and half shut; cautioned to stay on main level few days till swelling better and less L calf pain;  Patient educated not to drive till swelling better (but states he will anyway)    Exercises        Assessment/Plan    PT Assessment Patent does not need any further PT services  PT Diagnosis Difficulty walking;Acute pain   PT Problem List    PT Treatment Interventions     PT Goals (Current goals can be found in the Care Plan section) Acute Rehab PT Goals Patient Stated Goal: To go home today PT Goal Formulation: All assessment and education complete, DC therapy    Frequency     Barriers to discharge        Co-evaluation               End of Session Equipment Utilized During Treatment: Gait belt Activity Tolerance: Patient tolerated treatment well Patient left: in bed;with call bell/phone within reach;with family/visitor present Nurse Communication: Other (comment) (pt ready for d/c no follow up needs)    Functional Assessment Tool Used: Clinical Judgement Functional Limitation: Mobility: Walking and moving around Mobility: Walking and Moving Around Current Status [id number]): At least 20 percent but less than 40 percent impaired, limited or restricted Mobility: Walking and Moving Around Goal Status [phone]): At least 20 percent but less than 40 percent impaired, limited or restricted Mobility: Walking and Moving Around Discharge Status [phone]): At least 20 percent but less than 40 percent impaired, limited or restricted    Time: [id number] PT Time Calculation (min) (ACUTE ONLY): 23 min   Charges:   PT Evaluation $PT Eval Moderate Complexity: 1 Procedure PT Treatments $Gait Training: 8-22 mins   PT G Codes:   PT G-Codes **NOT FOR INPATIENT CLASS** Functional Assessment  Tool Used: Clinical Judgement Functional Limitation: Mobility: Walking and moving around Mobility: Walking and Moving Around Current Status [id number]): At least 20 percent but less than 40 percent impaired, limited or restricted Mobility: Walking and Moving Around Goal Status [phone]): At least 20 percent but less than 40 percent impaired, limited or restricted Mobility: Walking and Moving Around Discharge Status [phone]): At least 20 percent but less than 40 percent impaired, limited or restricted    Reginia Naas 02/25/2016, 2:37 PM  Magda Kiel, [place] 02/25/2016

## 2016-02-25 NOTE — Progress Notes (Signed)
Pt for dc home today with family.  PT recommending no OP follow up or DME.  No dc needs identified.    Reinaldo Raddle, RN, BSN  Trauma/Neuro ICU Case Manager [phone]

## 2016-03-01 DIAGNOSIS — S0921XA Traumatic rupture of right ear drum, initial encounter: Secondary | ICD-10-CM | POA: Diagnosis not present

## 2016-03-01 DIAGNOSIS — S0240DA Maxillary fracture, left side, initial encounter for closed fracture: Secondary | ICD-10-CM | POA: Diagnosis not present

## 2016-03-02 ENCOUNTER — Ambulatory Visit (HOSPITAL_COMMUNITY)
Admission: RE | Admit: 2016-03-02 | Discharge: 2016-03-02 | Disposition: A | Discharge status: Home / Self Care | Payer: Medicare Other | Source: Ambulatory Visit | Attending: Orthopedic Surgery | Admitting: Orthopedic Surgery

## 2016-03-02 ENCOUNTER — Other Ambulatory Visit (HOSPITAL_COMMUNITY): Payer: Self-pay | Admitting: Orthopedic Surgery

## 2016-03-02 DIAGNOSIS — M79602 Pain in left arm: Secondary | ICD-10-CM | POA: Diagnosis not present

## 2016-03-02 DIAGNOSIS — M7989 Other specified soft tissue disorders: Secondary | ICD-10-CM | POA: Diagnosis not present

## 2016-03-02 DIAGNOSIS — M79605 Pain in left leg: Secondary | ICD-10-CM

## 2016-03-02 DIAGNOSIS — S63621A Sprain of interphalangeal joint of right thumb, initial encounter: Secondary | ICD-10-CM | POA: Diagnosis not present

## 2016-03-02 NOTE — Progress Notes (Signed)
VASCULAR LAB PRELIMINARY  PRELIMINARY  PRELIMINARY  PRELIMINARY  Left lower extremity venous duplex completed.    Preliminary report:  Left:  No evidence of DVT, superficial thrombosis, or Baker's cyst.  Fluid noted down the posterior calf.  Possible muscle tear from fall 02-24-16.  Sharilynn Cassity, RVT 03/02/2016, 3:44 PM

## 2016-03-08 DIAGNOSIS — S0240BA Malar fracture, left side, initial encounter for closed fracture: Secondary | ICD-10-CM | POA: Diagnosis not present

## 2016-03-08 DIAGNOSIS — S62514A Nondisplaced fracture of proximal phalanx of right thumb, initial encounter for closed fracture: Secondary | ICD-10-CM | POA: Diagnosis not present

## 2016-03-08 DIAGNOSIS — R600 Localized edema: Secondary | ICD-10-CM | POA: Diagnosis not present

## 2016-03-08 DIAGNOSIS — I6200 Nontraumatic subdural hemorrhage, unspecified: Secondary | ICD-10-CM | POA: Diagnosis not present

## 2016-03-10 DIAGNOSIS — Z85828 Personal history of other malignant neoplasm of skin: Secondary | ICD-10-CM | POA: Diagnosis not present

## 2016-03-10 DIAGNOSIS — L814 Other melanin hyperpigmentation: Secondary | ICD-10-CM | POA: Diagnosis not present

## 2016-03-10 DIAGNOSIS — D225 Melanocytic nevi of trunk: Secondary | ICD-10-CM | POA: Diagnosis not present

## 2016-03-10 DIAGNOSIS — L821 Other seborrheic keratosis: Secondary | ICD-10-CM | POA: Diagnosis not present

## 2016-03-10 DIAGNOSIS — L820 Inflamed seborrheic keratosis: Secondary | ICD-10-CM | POA: Diagnosis not present

## 2016-03-10 DIAGNOSIS — D1801 Hemangioma of skin and subcutaneous tissue: Secondary | ICD-10-CM | POA: Diagnosis not present

## 2016-03-10 DIAGNOSIS — L570 Actinic keratosis: Secondary | ICD-10-CM | POA: Diagnosis not present

## 2016-04-04 DIAGNOSIS — S63621D Sprain of interphalangeal joint of right thumb, subsequent encounter: Secondary | ICD-10-CM | POA: Diagnosis not present

## 2016-04-04 DIAGNOSIS — M7989 Other specified soft tissue disorders: Secondary | ICD-10-CM | POA: Diagnosis not present

## 2016-05-05 DIAGNOSIS — S0921XD Traumatic rupture of right ear drum, subsequent encounter: Secondary | ICD-10-CM | POA: Diagnosis not present

## 2016-05-05 DIAGNOSIS — S0240DD Maxillary fracture, left side, subsequent encounter for fracture with routine healing: Secondary | ICD-10-CM | POA: Diagnosis not present

## 2016-07-08 ENCOUNTER — Encounter: Payer: Self-pay | Admitting: Interventional Cardiology

## 2016-07-25 DIAGNOSIS — Z23 Encounter for immunization: Secondary | ICD-10-CM | POA: Diagnosis not present

## 2016-07-27 ENCOUNTER — Encounter: Payer: Self-pay | Admitting: Interventional Cardiology

## 2016-07-27 ENCOUNTER — Ambulatory Visit (INDEPENDENT_AMBULATORY_CARE_PROVIDER_SITE_OTHER): Payer: Medicare Other | Admitting: Interventional Cardiology

## 2016-07-27 VITALS — BP 124/64 | HR 51 | Ht 66.0 in | Wt 165.0 lb

## 2016-07-27 DIAGNOSIS — E7849 Other hyperlipidemia: Secondary | ICD-10-CM

## 2016-07-27 DIAGNOSIS — E784 Other hyperlipidemia: Secondary | ICD-10-CM | POA: Diagnosis not present

## 2016-07-27 DIAGNOSIS — I441 Atrioventricular block, second degree: Secondary | ICD-10-CM | POA: Diagnosis not present

## 2016-07-27 DIAGNOSIS — I2510 Atherosclerotic heart disease of native coronary artery without angina pectoris: Secondary | ICD-10-CM | POA: Diagnosis not present

## 2016-07-27 DIAGNOSIS — R001 Bradycardia, unspecified: Secondary | ICD-10-CM | POA: Diagnosis not present

## 2016-07-27 DIAGNOSIS — N522 Drug-induced erectile dysfunction: Secondary | ICD-10-CM
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
## 2016-07-27 NOTE — Patient Instructions (Addendum)
Medication Instructions:  None  Labwork: None  Testing/Procedures: Your physician has recommended that you wear a 48 hour holter monitor. Holter monitors are medical devices that record the heart's electrical activity. Doctors most often use these monitors to diagnose arrhythmias. Arrhythmias are problems with the speed or rhythm of the heartbeat. The monitor is a small, portable device. You can wear one while you do your normal daily activities. This is usually used to diagnose what is causing palpitations/syncope (passing out).    Follow-Up: Your physician recommends that you schedule a follow-up appointment in: 2-3 weeks after monitor is removed. (10:45 11/17)   Any Other Special Instructions Will Be Listed Below (If Applicable).     If you need a refill on your cardiac medications before your next appointment, please call your pharmacy.

## 2016-08-04 ENCOUNTER — Other Ambulatory Visit: Payer: Self-pay | Admitting: Interventional Cardiology

## 2016-08-04 ENCOUNTER — Ambulatory Visit (INDEPENDENT_AMBULATORY_CARE_PROVIDER_SITE_OTHER): Payer: Medicare Other

## 2016-08-04 DIAGNOSIS — R001 Bradycardia, unspecified: Secondary | ICD-10-CM | POA: Diagnosis not present

## 2016-08-04 DIAGNOSIS — I441 Atrioventricular block, second degree: Secondary | ICD-10-CM | POA: Diagnosis not present

## 2016-08-18 ENCOUNTER — Encounter: Payer: Self-pay | Admitting: Interventional Cardiology

## 2016-08-23 ENCOUNTER — Telehealth: Payer: Self-pay | Admitting: *Deleted

## 2016-08-23 NOTE — Telephone Encounter (Signed)
Spoke with pt and advised him of monitor results and recommendations per Dr. Tamala Julian.  Advised pt I will send message to EP scheduler to call and get pt appt.  Pt verbalized understanding and was in agreement with this plan.  Pt states that he will be out of the house from 11A-1P each day as he takes wife out of for lunch.

## 2016-08-23 NOTE — Telephone Encounter (Signed)
-----   Message from Belva Crome, MD sent at 08/20/2016  2:16 PM EDT ----- Let the patient know the study is abnormal and he should be referred to to EP to discuss permanent pacer. Please arrange. A copy will be sent to Irven Shelling, MD

## 2016-09-02 ENCOUNTER — Ambulatory Visit (INDEPENDENT_AMBULATORY_CARE_PROVIDER_SITE_OTHER): Payer: Medicare Other | Admitting: Interventional Cardiology

## 2016-09-02 ENCOUNTER — Encounter: Payer: Self-pay | Admitting: Interventional Cardiology

## 2016-09-02 VITALS — BP 132/70 | HR 80 | Ht 67.0 in | Wt 165.8 lb

## 2016-09-02 DIAGNOSIS — R001 Bradycardia, unspecified: Secondary | ICD-10-CM

## 2016-09-02 DIAGNOSIS — E7849 Other hyperlipidemia: Secondary | ICD-10-CM

## 2016-09-02 DIAGNOSIS — I2510 Atherosclerotic heart disease of native coronary artery without angina pectoris: Secondary | ICD-10-CM

## 2016-09-02 DIAGNOSIS — E784 Other hyperlipidemia: Secondary | ICD-10-CM | POA: Diagnosis not present

## 2016-09-02 NOTE — Patient Instructions (Signed)
Medication Instructions:  Your physician recommends that you continue on your current medications as directed. Please refer to the Current Medication list given to you today.   Labwork: none  Testing/Procedures: None   Follow-Up: You have been scheduled to see Dr. Lovena Le on November 28,2017 at 2:30.  This is here at [address] [place] physician wants you to follow-up in: 6-9 months.  You will receive a reminder letter in the mail two months in advance. If you don't receive a letter, please call our office to schedule the follow-up appointment.   Any Other Special Instructions Will Be Listed Below (If Applicable).     If you need a refill on your cardiac medications before your next appointment, please call your pharmacy.

## 2016-09-02 NOTE — Progress Notes (Signed)
Cardiology Office Note    Date:  09/02/2016   ID:  Brandon Robles, DOB [birth year], MRN [id number]  PCP:  Irven Shelling, MD  Cardiologist: Sinclair Grooms, MD   Chief Complaint  Patient presents with  . Follow-up    Bradycardia    History of Present Illness:  Brandon Robles is a 82 y.o. male who presents for coronary artery disease with prior non-drug-eluting stent implantation during acute inferior in 2001and LAD bare-metal stent 2007, hypertension, hyperlipidemia, sinus node dysfunction,and history of pericarditis.  Had abnormal Holter with slow heart rates. Will see EP. He has LBBB, 2 degree AV block and long first-degree AV block. Since the last office visit the exertional fatigue and dizziness have been much less an issue. He denies chest pain. He denies dyspnea.  Past Medical History:  Diagnosis Date  . Blastocystis hominis   . CAD (coronary artery disease)    S/P inferior MI 07/2000 with RCA stenting at that time. In 03/2006 he had a LAD non-drug-eluting stent implanted.  . Chronic insomnia   . Complex renal cyst    Left  . Diverticulitis   . ED (erectile dysfunction)    Vacuum pump  . Essential hypertension, benign    Stable, on lisinopril-HCTZ & Atenolol.  [name] GERD (gastroesophageal reflux disease)   . H/O pericarditis   . Hemorrhoids   . LBBB (left bundle branch block)   . Multiple rib fractures 09/25/10   PTX 09/2010  . Nephrolithiasis    Renal stone basketing.  . Pure hypercholesterolemia    On Red Yeast Rice & CoQ 10.  . Sinoatrial node dysfunction (HCC)    EC Holter 24hr 06/24/13 showed sinus bradycardia with probable chronotropic incompetence. May need pacemaker per Dr. Daneen Schick.  . Skin cancer    Left base (Face) & (Ear in 12/2011).    Past Surgical History:  Procedure Laterality Date  . CORONARY ANGIOPLASTY WITH STENT PLACEMENT  07/25/00   MI in 07/25/00 with RCA stenting at that time.  . CORONARY ANGIOPLASTY WITH STENT PLACEMENT  04/14/06   Bare  metal stent in proximal LAD [facility] - 3.5 x16 stent) by Dr. Linard Millers.  [name] PROSTATE BIOPSY     Evidence  . RIGHT THORACOTOMY,OPEN REDUCTION AND INTERNAL FIXATION OF RIBS 4-7  09/25/2010  . STONE EXTRACTION WITH BASKET     Renal    Current Medications: Outpatient Medications Prior to Visit  Medication Sig Dispense Refill  . acetaminophen (TYLENOL) 325 MG tablet Take 2 tablets (650 mg total) by mouth every 4 (four) hours as needed for mild pain.    [name] aspirin 81 MG chewable tablet Chew 81 mg by mouth daily.    . Coenzyme Q10 (COQ10) 100 MG CAPS Take 1 capsule by mouth daily.    [name] lisinopril-hydrochlorothiazide (PRINZIDE,ZESTORETIC) 10-12.5 MG per tablet Take 1 tablet by mouth daily.     . nitroGLYCERIN (NITROSTAT) 0.4 MG SL tablet Place 0.4 mg under the tongue every 5 (five) minutes as needed for chest pain. (MAXIMUM of 3 TABLETS)    . OVER THE COUNTER MEDICATION Take 3 capsules by mouth daily. OTC muscadine plus    . Red Yeast Rice 600 MG CAPS Take 1 capsule by mouth 2 (two) times daily.     . temazepam (RESTORIL) 30 MG capsule Take 30 mg by mouth at bedtime as needed for sleep.     . Omega-3 Fatty Acids (FISH OIL) 600 MG CAPS Take 600 mg by  mouth daily.     No facility-administered medications prior to visit.      Allergies:   Vitamin d analogs; Ambien [zolpidem tartrate]; and Honey   Social History   Social History  . Marital status: Single    Spouse name: N/A  . Number of children: N/A  . Years of education: N/A   Occupational History  . Farming Retired   Social History Main Topics  . Smoking status: Former Smoker    Types: Cigars  . Smokeless tobacco: Never Used     Comment: quit 1990  . Alcohol use Yes     Comment: NOT ON A REGULAR BASIS  . Drug use: No  . Sexual activity: Not Asked   Other Topics Concern  . None   Social History Narrative  . None     Family History:  The patient's family history includes Aneurysm in his sister; CVA in his  mother; Diabetes in his brother; Heart attack in his father; Heart disease in his brother and sister; Heart failure in his brother; Hypertension in his brother; Kidney failure in his father; Lung disease in his brother.   ROS:   Please see the history of present illness.    Dizziness when he bends or stoops and then stands up.  All other systems reviewed and are negative.   PHYSICAL EXAM:   VS:  BP 132/70   Pulse 80   Ht 5\' 7"  (1.702 m)   Wt 165 lb 12.8 oz (75.2 kg)   BMI 25.97 kg/m    GEN: Well nourished, well developed, in no acute distress  HEENT: normal  Neck: no JVD, carotid bruits, or masses Cardiac: RRR; no murmurs, rubs, or gallops,no edema  Respiratory:  clear to auscultation bilaterally, normal work of breathing GI: soft, nontender, nondistended, + BS MS: no deformity or atrophy  Skin: warm and dry, no rash Neuro:  Alert and Oriented x 3, Strength and sensation are intact Psych: euthymic mood, full affect  Wt Readings from Last 3 Encounters:  09/02/16 165 lb 12.8 oz (75.2 kg)  07/27/16 165 lb (74.8 kg)  02/24/16 162 lb 4.1 oz (73.6 kg)      Studies/Labs Reviewed:   EKG:  EKG  Not repeated. The tracing from 07/27/16 demonstrates Mobitz 1 second-degree heart block.  Recent Labs: 02/25/2016: BUN 18; Creatinine, Ser 1.06; Hemoglobin 12.0; Platelets 206; Potassium 3.9; Sodium 137   Lipid Panel    Component Value Date/Time   CHOL 140 06/30/2014 1025   TRIG 48.0 06/30/2014 1025   HDL 45.80 06/30/2014 1025   CHOLHDL 3 06/30/2014 1025   VLDL 9.6 06/30/2014 1025   LDLCALC 85 06/30/2014 1025    Additional studies/ records that were reviewed today include:  48 hour Holter monitor performed 08/04/16: Study Highlights     Basic rhythm is NSR and Sinus Bradycardia.  Severe 1st degree AV block and occasional 2nd degree AV block.  Severe bradycardia during sleep with HR < 35 bpm.  Occasional ventricular ectopy.   Advanced AV conduction system abnormality.  Impending high grade AV-block.        ASSESSMENT:    1. Bradycardia   2. Coronary artery disease involving native coronary artery of native heart without angina pectoris   3. Other hyperlipidemia      PLAN:  In order of problems listed above:  1. The bradycardia is due to A-V conduction abnormality with heart rates as low as 31 bpm. He also has sinus bradycardia. Most of the extremely  slow heart rates occurred during sleep.. Still awaiting an EP opinion concerning permanent pacemaker implantation. 2. Asymptomatic 3. Not addressed    Medication Adjustments/Labs and Tests Ordered: Current medicines are reviewed at length with the patient today.  Concerns regarding medicines are outlined above.  Medication changes, Labs and Tests ordered today are listed in the Patient Instructions below. There are no Patient Instructions on file for this visit.   Signed, Sinclair Grooms, MD  09/02/2016 10:58 AM    [facility] [address], [place], [place]  [address] Phone: [phone]; Fax: [phone]

## 2016-09-10 ENCOUNTER — Emergency Department (HOSPITAL_COMMUNITY)
Admission: EM | Admit: 2016-09-10 | Discharge: 2016-09-11 | Disposition: A | Discharge status: Home / Self Care | Payer: Medicare Other | Attending: Emergency Medicine | Admitting: Emergency Medicine

## 2016-09-10 ENCOUNTER — Encounter (HOSPITAL_COMMUNITY): Payer: Self-pay | Admitting: Emergency Medicine

## 2016-09-10 DIAGNOSIS — Z955 Presence of coronary angioplasty implant and graft: Secondary | ICD-10-CM | POA: Insufficient documentation

## 2016-09-10 DIAGNOSIS — Z79899 Other long term (current) drug therapy: Secondary | ICD-10-CM | POA: Diagnosis not present

## 2016-09-10 DIAGNOSIS — I10 Essential (primary) hypertension: Secondary | ICD-10-CM | POA: Diagnosis not present

## 2016-09-10 DIAGNOSIS — Z7982 Long term (current) use of aspirin: Secondary | ICD-10-CM | POA: Insufficient documentation

## 2016-09-10 DIAGNOSIS — Z87891 Personal history of nicotine dependence: Secondary | ICD-10-CM | POA: Diagnosis not present

## 2016-09-10 DIAGNOSIS — Z85828 Personal history of other malignant neoplasm of skin: Secondary | ICD-10-CM | POA: Diagnosis not present

## 2016-09-10 DIAGNOSIS — I2510 Atherosclerotic heart disease of native coronary artery without angina pectoris: Secondary | ICD-10-CM | POA: Insufficient documentation

## 2016-09-10 LAB — BASIC METABOLIC PANEL
Anion gap: 10 (ref 5–15)
BUN: 22 mg/dL — AB (ref 6–20)
CO2: 25 mmol/L (ref 22–32)
CREATININE: 1.48 mg/dL — AB (ref 0.61–1.24)
Calcium: 9.5 mg/dL (ref 8.9–10.3)
Chloride: 103 mmol/L (ref 101–111)
GFR calc Af Amer: 47 mL/min — ABNORMAL LOW (ref >60)
GFR, EST NON AFRICAN AMERICAN: 41 mL/min — AB (ref >60)
Glucose, Bld: 155 mg/dL — ABNORMAL HIGH (ref 65–99)
Potassium: 3.8 mmol/L (ref 3.5–5.1)
SODIUM: 138 mmol/L (ref 135–145)

## 2016-09-10 LAB — CBC
HCT: 40.5 % (ref 39.0–52.0)
Hemoglobin: 13.7 g/dL (ref 13.0–17.0)
MCH: 31.6 pg (ref 26.0–34.0)
MCHC: 33.8 g/dL (ref 30.0–36.0)
MCV: 93.3 fL (ref 78.0–100.0)
PLATELETS: 233 10*3/uL (ref 150–400)
RBC: 4.34 MIL/uL (ref 4.22–5.81)
RDW: 13.8 % (ref 11.5–15.5)
WBC: 8.1 10*3/uL (ref 4.0–10.5)

## 2016-09-10 LAB — URINALYSIS, ROUTINE W REFLEX MICROSCOPIC
Bilirubin Urine: NEGATIVE
GLUCOSE, UA: NEGATIVE mg/dL
Hgb urine dipstick: NEGATIVE
KETONES UR: NEGATIVE mg/dL
LEUKOCYTES UA: NEGATIVE
NITRITE: NEGATIVE
PROTEIN: NEGATIVE mg/dL
Specific Gravity, Urine: 1.019 (ref 1.005–1.030)
pH: 5 (ref 5.0–8.0)

## 2016-09-10 NOTE — Discharge Instructions (Signed)
Your labs today showed that your kidney function was elevated. This may be due to dehydration, but can be due to other causes. Be sure to drink plenty of water for the next few days.   When you see your doctor next week be sure to discuss this finding and get a repeat test of your kidney function to be sure that it is not worsening. Discuss your blood pressure medicine and ask if they would like to make any changes.  If you experience chest pain, shortness of breath, vision changes, headaches or other symptoms along with high blood pressure you should return to the emergency department for evaluation.

## 2016-09-10 NOTE — ED Provider Notes (Signed)
[facility] DEPT Provider Note   CSN: [id number] Arrival date & time: 09/10/16  2028     History   Chief Complaint Chief Complaint  Patient presents with  . Hypertension    HPI Brandon Robles is a 82 y.o. male.  HPI Patient is an 82 year old male with past medical history of hypertension, CAD status post stents 2, hypercholesterolemia who presents for evaluation of hypertension. Patient reports that he was checking his blood pressure this evening when he noted that it was elevated with a heart rate of 105. He subsequently went to the fire station and rechecked his pressure which was 200s/90s. Denies associated symptoms including vision changes, headache, chest pain, shortness of breath, weight gain, or worsening lower extremity swelling. 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 Reports been taking his blood pressure medications as prescribed. Reports normal BP runs in 140s/60-70s. He has a follow-up appointment with Dr. Tamala Julian (cardiology) on Tuesday.  Past Medical History:  Diagnosis Date  . Blastocystis hominis   . CAD (coronary artery disease)    S/P inferior MI 07/2000 with RCA stenting at that time. In 03/2006 he had a LAD non-drug-eluting stent implanted.  . Chronic insomnia   . Complex renal cyst    Left  . Diverticulitis   . ED (erectile dysfunction)    Vacuum pump  . Essential hypertension, benign    Stable, on lisinopril-HCTZ & Atenolol.  [name] GERD (gastroesophageal reflux disease)   . H/O pericarditis   . Hemorrhoids   . LBBB (left bundle branch block)   . Multiple rib fractures 09/25/10   PTX 09/2010  . Nephrolithiasis    Renal stone basketing.  . Pure hypercholesterolemia    On Red Yeast Rice & CoQ 10.  . Sinoatrial node dysfunction (HCC)    EC Holter 24hr 06/24/13 showed sinus bradycardia with probable chronotropic  incompetence. May need pacemaker per Dr. Daneen Schick.  . Skin cancer    Left base (Face) & (Ear in 12/2011).    Patient Active Problem List   Diagnosis Date Noted  . Fall 02/25/2016  . Multiple facial fractures ([place]) 02/25/2016  . Fracture of phalanx of right thumb 02/25/2016  . Traumatic subdural hematoma ([place]) 02/24/2016  . Bradycardia 07/18/2013  . Nephrolithiasis   . Diverticulitis   . Hemorrhoids   . ED (erectile dysfunction)   . GERD (gastroesophageal reflux disease)   . Hyperlipemia   . CAD (coronary artery disease)     Past Surgical History:  Procedure Laterality Date  . CORONARY ANGIOPLASTY WITH STENT PLACEMENT  07/25/00   MI in 07/25/00 with RCA stenting at that time.  . CORONARY ANGIOPLASTY WITH STENT PLACEMENT  04/14/06   Bare metal stent in proximal LAD [facility] - 3.5 x16 stent) by Dr. Linard Millers.  [name] PROSTATE BIOPSY     Evidence  . RIGHT THORACOTOMY,OPEN REDUCTION AND INTERNAL FIXATION OF RIBS 4-7  09/25/2010  . STONE EXTRACTION WITH BASKET     Renal       Home Medications    Prior to Admission medications   Medication Sig Start Date End Date Taking? Authorizing Provider  aspirin EC 81 MG tablet Take 81 mg by mouth daily.   Yes Historical Provider, MD  Coenzyme Q10 (COQ10) 100 MG CAPS Take 100 mg by mouth daily.  Yes Historical Provider, MD  lisinopril-hydrochlorothiazide (PRINZIDE,ZESTORETIC) 10-12.5 MG per tablet Take 1 tablet by mouth daily.    Yes Historical Provider, MD  nitroGLYCERIN (NITROSTAT) 0.4 MG SL tablet Place 0.4 mg under the tongue every 5 (five) minutes as needed for chest pain. (MAXIMUM of 3 TABLETS)   Yes Historical Provider, MD  OVER THE COUNTER MEDICATION Take 2 capsules by mouth daily. OTC [place] capsules   Yes Historical Provider, MD  Red Yeast Rice 600 MG CAPS Take 1 capsule by mouth daily.    Yes Historical Provider, MD  temazepam (RESTORIL) 30 MG capsule Take 30 mg by mouth at bedtime.    Yes Historical Provider,  MD  Throat Lozenges [facility] FRIEND MT) Use as directed 1 lozenge in the mouth or throat 2 (two) times daily as needed (congestion).   Yes Historical Provider, MD  acetaminophen (TYLENOL) 325 MG tablet Take 2 tablets (650 mg total) by mouth every 4 (four) hours as needed for mild pain. Patient not taking: Reported on 09/10/2016 02/25/16   Erby Pian, NP    Family History Family History  Problem Relation Age of Onset  . CVA Mother   . Heart attack Father   . Kidney failure Father   . Heart disease Sister     Possible heart disease  . Aneurysm Sister   . Hypertension Brother   . Heart disease Brother   . Heart failure Brother   . Lung disease Brother   . Diabetes Brother     Social History Social History  Substance Use Topics  . Smoking status: Former Smoker    Types: Cigars  . Smokeless tobacco: Never Used     Comment: quit 1990  . Alcohol use Yes     Comment: NOT ON A REGULAR BASIS     Allergies   Vitamin d analogs; Ambien [zolpidem tartrate]; and Honey   Review of Systems Review of Systems  Constitutional: Negative for chills and fever.  HENT: Negative for ear pain and sore throat.   Eyes: Negative for pain and visual disturbance.  Respiratory: Negative for cough and shortness of breath.   Cardiovascular: Negative for chest pain and palpitations.  Gastrointestinal: Negative for abdominal pain and vomiting.  Genitourinary: Negative for dysuria and hematuria.  Musculoskeletal: Negative for arthralgias and back pain.  Skin: Negative for color change and rash.  Neurological: Negative for seizures and syncope.  All other systems reviewed and are negative.    Physical Exam Updated Vital Signs BP 155/76   Pulse 71   Temp 97.7 F (36.5 C) (Oral)   Resp 13   Wt 75.2 kg   SpO2 96%   BMI 25.95 kg/m   Physical Exam  Constitutional: He appears well-developed and well-nourished.  HENT:  Head: Normocephalic and atraumatic.  Eyes: Conjunctivae are normal.    Neck: Neck supple.  Cardiovascular: Normal rate and regular rhythm.   No murmur heard. Pulmonary/Chest: Effort normal and breath sounds normal. No respiratory distress. He has no wheezes.  Abdominal: Soft. There is no tenderness.  Musculoskeletal: He exhibits no edema.  Neurological: He is alert.  Skin: Skin is warm and dry.  Psychiatric: He has a normal mood and affect.  Nursing note and vitals reviewed.    ED Treatments / Results  Labs (all labs ordered are listed, but only abnormal results are displayed) Labs Reviewed  BASIC METABOLIC PANEL - Abnormal; Notable for the following:       Result Value   Glucose, Bld 155 (*)  BUN 22 (*)    Creatinine, Ser 1.48 (*)    GFR calc non Af Amer 41 (*)    GFR calc Af Amer 47 (*)    All other components within normal limits  CBC  URINALYSIS, ROUTINE W REFLEX MICROSCOPIC (NOT AT [facility])    EKG  EKG Interpretation  Date/Time:  Saturday September 10 2016 20:31:45 EST Ventricular Rate:  95 PR Interval:  252 QRS Duration: 118 QT Interval:  342 QTC Calculation: 429 R Axis:   -64 Text Interpretation:  Sinus rhythm with 1st degree A-V block Left axis deviation Incomplete left bundle branch block ST & T wave abnormality, consider lateral ischemia Abnormal ECG LBBB is new from previous tracing 6 years ago Confirmed by LITTLE MD, RACHEL ([id number]) on 09/10/2016 10:57:29 PM       Radiology No results found.  Procedures Procedures (including critical care time)  Medications Ordered in ED Medications - No data to display   Initial Impression / Assessment and Plan / ED Course  I have reviewed the triage vital signs and the nursing notes.  Pertinent labs & imaging results that were available during my care of the patient were reviewed by me and considered in my medical decision making (see chart for details).  Clinical Course     Patient is a 82 year old male with past lashes above who presents with asymptomatic hypertension. On  arrival to the emergency department blood pressure is improved to systolics in the 956L heart rates in the 80s. Patient remains asymptomatic. Exam unremarkable. EKG shows sinus rhythm with first degree AV block and incomplete left bundle-branch block. This was previously documented on recent cardiology note and does not appear to be new. S[place]ossa criteria are not met. Labs significant for mildly elevated BUN and creatinine. Patient reports poor by mouth fluid intake over the past 2 days he has been working outside. He is also on lisinopril-HCTZ which may be contributing to dehydration. May be due to ongoing damage from hypertension, but less likely as UA is negative for protein and patient reports BP is relatively well controlled normally. Patient remains asymptomatic, and SBPs improved to 150s without intervention.  No further workup is necessary at this time. The patient has close follow-up scheduled with his cardiologist on Tuesday. He was discharged in stable condition. Advised to push fluids, particularly water, for the next few days. Advised that he will need a recheck of his kidney function at follow-up appointment to ensure that is returning toward normal and further intervention is not needed. Return precautions for hypertension were discussed and patient and daughter are in agreement with plan. Will follow-up as noted above.  Patient seen and discussed with Dr. Rex Kras, ED attending  Final Clinical Impressions(s) / ED Diagnoses   Final diagnoses:  Essential hypertension    New Prescriptions New Prescriptions   No medications on file     Gibson Ramp, MD 09/10/16 Ward, MD 09/13/16 2018

## 2016-09-10 NOTE — ED Triage Notes (Signed)
States checks heart rate and blood pressure once a day. Checked it and heart rate was 108, BP 190/98. States was feeling a little bad, but no specific symptoms. Denies pain. Denies SOB. States just wants to get checked out and get back home as soon as he can.

## 2016-09-13 ENCOUNTER — Ambulatory Visit (INDEPENDENT_AMBULATORY_CARE_PROVIDER_SITE_OTHER): Payer: Medicare Other | Admitting: Internal Medicine

## 2016-09-13 ENCOUNTER — Encounter: Payer: Self-pay | Admitting: Internal Medicine

## 2016-09-13 VITALS — BP 120/60 | HR 60 | Ht 67.5 in | Wt 165.6 lb

## 2016-09-13 DIAGNOSIS — R001 Bradycardia, unspecified: Secondary | ICD-10-CM | POA: Diagnosis not present

## 2016-09-13 NOTE — Patient Instructions (Addendum)
Medication Instructions:  Your physician recommends that you continue on your current medications as directed. Please refer to the Current Medication list given to you today.   Labwork: None Ordered   Testing/Procedures: None Ordered   Follow-Up: Will follow-up after review with Dr. Tamala Julian.  Any Other Special Instructions Will Be Listed Below (If Applicable).     If you need a refill on your cardiac medications before your next appointment, please call your pharmacy.

## 2016-09-13 NOTE — Progress Notes (Signed)
HPI [name] is referred back today by Dr. Tamala Julian for consideration of a PPM. He is a pleasant 82 yo man with CAD, HTN, and bradycardia. He wore a cardiac monitor on 2 occaisions which demonstrate episodes of bradycardia, mostly at night but occaisionally during the day.  He has been asymptomatic. He denies chest pain, shortness of breath, syncope, or peripheral edema. 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 No dizzy spells or near-syncope. He has had 2 episodes where his heart was racing in the 100-120 range. These have both been associated with dehydration.  Allergies  Allergen Reactions  . Vitamin D Analogs Other (See Comments)    "pins and needles feeling"  . Ambien [Zolpidem Tartrate] Other (See Comments)    Confusion  . Honey Other (See Comments)    "pins and needles feeling"     Current Outpatient Prescriptions  Medication Sig Dispense Refill  . acetaminophen (TYLENOL) 325 MG tablet Take 2 tablets (650 mg total) by mouth every 4 (four) hours as needed for mild pain.    [name] aspirin EC 81 MG tablet Take 81 mg by mouth daily.    . Coenzyme Q10 (COQ10) 100 MG CAPS Take 100 mg by mouth daily.     [name] lisinopril-hydrochlorothiazide (PRINZIDE,ZESTORETIC) 10-12.5 MG per tablet Take 1 tablet by mouth daily.     . nitroGLYCERIN (NITROSTAT) 0.4 MG SL tablet Place 0.4 mg under the tongue every 5 (five) minutes as needed for chest pain. (MAXIMUM of 3 TABLETS)    . OVER THE COUNTER MEDICATION Take 2 capsules by mouth daily. OTC [place] capsules    . Red Yeast Rice 600 MG CAPS Take 1 capsule by mouth daily.     . temazepam (RESTORIL) 30 MG capsule Take 30 mg by mouth at bedtime.     . Throat Lozenges ([place] FRIEND MT) Use as directed 1 lozenge in the mouth or throat 2 (two) times daily as needed (congestion).     No current facility-administered medications for this visit.      Past Medical History:  Diagnosis Date  . Blastocystis hominis    . CAD (coronary artery disease)    S/P inferior MI 07/2000 with RCA stenting at that time. In 03/2006 he had a LAD non-drug-eluting stent implanted.  . Chronic insomnia   . Complex renal cyst    Left  . Diverticulitis   . ED (erectile dysfunction)    Vacuum pump  . Essential hypertension, benign    Stable, on lisinopril-HCTZ & Atenolol.  [name] GERD (gastroesophageal reflux disease)   . H/O pericarditis   . Hemorrhoids   . LBBB (left bundle branch block)   . Multiple rib fractures 09/25/10   PTX 09/2010  . Nephrolithiasis    Renal stone basketing.  . Pure hypercholesterolemia    On Red Yeast Rice & CoQ 10.  . Sinoatrial node dysfunction (HCC)    EC Holter 24hr 06/24/13 showed sinus bradycardia with probable chronotropic incompetence. May need pacemaker per Dr. Daneen Schick.  . Skin cancer    Left base (Face) & (Ear in 12/2011).    ROS:   All systems reviewed and negative except as noted in the HPI.   Past Surgical History:  Procedure Laterality Date  . CORONARY ANGIOPLASTY WITH STENT PLACEMENT  07/25/00   MI in 07/25/00 with RCA stenting at that time.  . CORONARY ANGIOPLASTY WITH STENT PLACEMENT  04/14/06   Bare metal  stent in proximal LAD [facility] - 3.5 x16 stent) by Dr. Linard Millers.  [name] PROSTATE BIOPSY     Evidence  . RIGHT THORACOTOMY,OPEN REDUCTION AND INTERNAL FIXATION OF RIBS 4-7  09/25/2010  . STONE EXTRACTION WITH BASKET     Renal     Family History  Problem Relation Age of Onset  . CVA Mother   . Heart attack Father   . Kidney failure Father   . Heart disease Sister     Possible heart disease  . Aneurysm Sister   . Hypertension Brother   . Heart disease Brother   . Heart failure Brother   . Lung disease Brother   . Diabetes Brother      Social History   Social History  . Marital status: Single    Spouse name: N/A  . Number of children: N/A  . Years of education: N/A   Occupational History  . Farming Retired   Social History Main  Topics  . Smoking status: Former Smoker    Types: Cigars  . Smokeless tobacco: Never Used     Comment: quit 1990  . Alcohol use Yes     Comment: NOT ON A REGULAR BASIS  . Drug use: No  . Sexual activity: Not on file   Other Topics Concern  . Not on file   Social History Narrative  . No narrative on file     BP 120/60   Pulse 60   Ht 5' 7.5" (1.715 m)   Wt 165 lb 9.6 oz (75.1 kg)   BMI 25.55 kg/m   Physical Exam:  Well appearing elderly man, NAD HEENT: Unremarkable Neck:  No JVD, no thyromegally Back:  No CVA tenderness Lungs:  Clear with no wheezes, rales or rhonchi HEART:  IRegular rate rhythm, no murmurs, no rubs, no clicks Abd:  soft, positive bowel sounds, no organomegally, no rebound, no guarding Ext:  2 plus pulses, no edema, no cyanosis, no clubbing Skin:  No rashes no nodules Neuro:  CN II through XII intact, motor grossly intact  EKG - nsr with marked first degree AV block and LBBB   Assess/Plan: 1. Conduction system disease - the patient has enough heart block on his ECG to warrant a PPM but he denies all symptoms. I have asked him to undergo watchful waiting. I have warned him about the signs he might experience if he develops symptoms of heart block.  2. CAD - he denies anginal symptoms. He is not on a beta blocker because of his conduction system disease. 3. HTN - his blood pressure is controlled. He will continue his current meds.  Mikle Bosworth.D.

## 2016-09-14 ENCOUNTER — Telehealth: Payer: Self-pay

## 2016-09-14 NOTE — Telephone Encounter (Signed)
Called, spoke with pt. Informed pt Dr. Lovena Le spoke with Dr. Tamala Julian. The recommendation is to hold off on the PPM for now. If pt has any episodes of dizziness or passing out, call our office immediately. Informed pt Dr. Lovena Le would like f/u in 1 year. Informed will send a letter 2 months prior to f/u appt. Pt verbalized understanding and agreed with plan.

## 2016-10-04 DIAGNOSIS — D3132 Benign neoplasm of left choroid: Secondary | ICD-10-CM | POA: Diagnosis not present

## 2016-10-04 DIAGNOSIS — H353131 Nonexudative age-related macular degeneration, bilateral, early dry stage: Secondary | ICD-10-CM | POA: Diagnosis not present

## 2016-10-04 DIAGNOSIS — H26493 Other secondary cataract, bilateral: Secondary | ICD-10-CM | POA: Diagnosis not present

## 2016-11-30 DIAGNOSIS — N183 Chronic kidney disease, stage 3 (moderate): Secondary | ICD-10-CM | POA: Diagnosis not present

## 2016-11-30 DIAGNOSIS — I129 Hypertensive chronic kidney disease with stage 1 through stage 4 chronic kidney disease, or unspecified chronic kidney disease: Secondary | ICD-10-CM | POA: Diagnosis not present

## 2016-11-30 DIAGNOSIS — I2510 Atherosclerotic heart disease of native coronary artery without angina pectoris: Secondary | ICD-10-CM | POA: Diagnosis not present

## 2016-11-30 DIAGNOSIS — I495 Sick sinus syndrome: Secondary | ICD-10-CM | POA: Diagnosis not present

## 2016-12-28 ENCOUNTER — Telehealth: Payer: Self-pay | Admitting: Interventional Cardiology

## 2016-12-28 NOTE — Telephone Encounter (Signed)
Spoke with pt and he states that he has not had any dizziness or HA.  Did mention that occasionally feels that his vision is a little "wavy" just above his eyes.  When he took his BP this morning he had not had his medications.  States he does feel more weak and fatigued.  Moved appt to tomorrow with Dr. Tamala Julian.  Pt appreciative for assistance.

## 2016-12-28 NOTE — Telephone Encounter (Signed)
New message    Pt c/o BP issue: STAT if pt c/o blurred vision, one-sided weakness or slurred speech  1. What are your last 5 BP readings? 163/85 hr-75 161/90 hr-111  2. Are you having any other symptoms (ex. Dizziness, headache, blurred vision, passed out)? no  3. What is your BP issue? Pt blood pressure is high.   Pt scheduled appt for 3/16 at 3:15pm with Dr. Tamala Julian

## 2016-12-28 NOTE — Telephone Encounter (Signed)
Left message to call back  

## 2016-12-29 ENCOUNTER — Ambulatory Visit (INDEPENDENT_AMBULATORY_CARE_PROVIDER_SITE_OTHER): Payer: Medicare Other | Admitting: Interventional Cardiology

## 2016-12-29 ENCOUNTER — Encounter: Payer: Self-pay | Admitting: Interventional Cardiology

## 2016-12-29 VITALS — BP 114/72 | HR 91

## 2016-12-29 DIAGNOSIS — I25118 Atherosclerotic heart disease of native coronary artery with other forms of angina pectoris: Secondary | ICD-10-CM

## 2016-12-29 DIAGNOSIS — I498 Other specified cardiac arrhythmias: Secondary | ICD-10-CM | POA: Diagnosis not present

## 2016-12-29 DIAGNOSIS — I442 Atrioventricular block, complete: Secondary | ICD-10-CM | POA: Insufficient documentation

## 2016-12-29 DIAGNOSIS — E784 Other hyperlipidemia: Secondary | ICD-10-CM

## 2016-12-29 DIAGNOSIS — E7849 Other hyperlipidemia: Secondary | ICD-10-CM

## 2016-12-29 MED ORDER — LISINOPRIL-HYDROCHLOROTHIAZIDE 10-12.5 MG PO TABS: 1.0000 | ORAL_TABLET | Freq: Every day | ORAL | 3 refills | Status: DC

## 2016-12-29 NOTE — Patient Instructions (Signed)
Medication Instructions:  None  Labwork: None  Testing/Procedures: Your physician has recommended that you wear a 48 hour holter monitor as soon as possible. Holter monitors are medical devices that record the heart's electrical activity. Doctors most often use these monitors to diagnose arrhythmias. Arrhythmias are problems with the speed or rhythm of the heartbeat. The monitor is a small, portable device. You can wear one while you do your normal daily activities. This is usually used to diagnose what is causing palpitations/syncope (passing out).    Follow-Up: Keep current follow up in May with Dr. Tamala Julian.   Any Other Special Instructions Will Be Listed Below (If Applicable).     If you need a refill on your cardiac medications before your next appointment, please call your pharmacy.

## 2016-12-29 NOTE — Progress Notes (Signed)
Cardiology Office Note    Date:  12/29/2016   ID:  Brandon Robles, DOB 1926/05/25, MRN [id number]  PCP:  Irven Shelling, MD  Cardiologist: Sinclair Grooms, MD   Chief Complaint  Patient presents with  . Follow-up    Bradycardia/tachycardia    History of Present Illness:  Brandon Robles is a 83 y.o. male  who presents for coronary artery disease with prior non-drug-eluting stent implantation during acute inferior in 2001and LAD bare-metal stent 2007, hypertension, hyperlipidemia, sinus node dysfunction, complete heart block, accelerated junctional rhythm and history of pericarditis.  The patient has seen Dr. Lovena Le in the past because of high-grade AV block. Patient has been asymptomatic. He has never been documented to have excessive bradycardia. He comes into the office today because he feels his heart rate is faster than usual. This has happened on at least 2 prior occasions. The first time he ever noticed this was in March 2017. It happened again in late 2017 leading to an emergency room visit in November that revealed accelerated junctional tachycardia. Over the past 3-4 days he has again noted an increase in heart rate when he measures his blood pressure running between 90 and 110 bpm. Please see EKG interpretation below.  No episodes of syncope, dyspnea, angina, orthopnea, or fatigue.   Past Medical History:  Diagnosis Date  . Blastocystis hominis   . CAD (coronary artery disease)    S/P inferior MI 07/2000 with RCA stenting at that time. In 03/2006 he had a LAD non-drug-eluting stent implanted.  . Chronic insomnia   . Complex renal cyst    Left  . Diverticulitis   . ED (erectile dysfunction)    Vacuum pump  . Essential hypertension, benign    Stable, on lisinopril-HCTZ & Atenolol.  [name] GERD (gastroesophageal reflux disease)   . H/O pericarditis   . Hemorrhoids   . LBBB (left bundle branch block)   . Multiple rib fractures 09/25/10   PTX 09/2010  . Nephrolithiasis    Renal stone basketing.  . Pure hypercholesterolemia    On Red Yeast Rice & CoQ 10.  . Sinoatrial node dysfunction (HCC)    EC Holter 24hr 06/24/13 showed sinus bradycardia with probable chronotropic incompetence. May need pacemaker per Dr. Daneen Schick.  . Skin cancer    Left base (Face) & (Ear in 12/2011).    Past Surgical History:  Procedure Laterality Date  . CORONARY ANGIOPLASTY WITH STENT PLACEMENT  07/25/00   MI in 07/25/00 with RCA stenting at that time.  . CORONARY ANGIOPLASTY WITH STENT PLACEMENT  04/14/06   Bare metal stent in proximal LAD [facility] - 3.5 x16 stent) by Dr. Linard Millers.  [name] PROSTATE BIOPSY     Evidence  . RIGHT THORACOTOMY,OPEN REDUCTION AND INTERNAL FIXATION OF RIBS 4-7  09/25/2010  . STONE EXTRACTION WITH BASKET     Renal    Current Medications: Outpatient Medications Prior to Visit  Medication Sig Dispense Refill  . acetaminophen (TYLENOL) 325 MG tablet Take 2 tablets (650 mg total) by mouth every 4 (four) hours as needed for mild pain.    [name] aspirin EC 81 MG tablet Take 81 mg by mouth daily.    . Coenzyme Q10 (COQ10) 100 MG CAPS Take 100 mg by mouth daily.     . nitroGLYCERIN (NITROSTAT) 0.4 MG SL tablet Place 0.4 mg under the tongue every 5 (five) minutes as needed for chest pain. (MAXIMUM of 3 TABLETS)    .  OVER THE COUNTER MEDICATION Take 2 capsules by mouth daily. OTC [place] capsules    . Red Yeast Rice 600 MG CAPS Take 1 capsule by mouth daily.     . temazepam (RESTORIL) 30 MG capsule Take 30 mg by mouth at bedtime.     . Throat Lozenges (FISHERMANS FRIEND MT) Use as directed 1 lozenge in the mouth or throat 2 (two) times daily as needed (congestion).    [name] lisinopril-hydrochlorothiazide (PRINZIDE,ZESTORETIC) 10-12.5 MG per tablet Take 1 tablet by mouth daily.      No facility-administered medications prior to visit.      Allergies:   Vitamin d analogs; Ambien [zolpidem tartrate]; and Honey   Social History   Social History  .  Marital status: Single    Spouse name: N/A  . Number of children: N/A  . Years of education: N/A   Occupational History  . Farming Retired   Social History Main Topics  . Smoking status: Former Smoker    Types: Cigars  . Smokeless tobacco: Never Used     Comment: quit 1990  . Alcohol use Yes     Comment: NOT ON A REGULAR BASIS  . Drug use: No  . Sexual activity: Not Asked   Other Topics Concern  . None   Social History Narrative  . None     Family History:  The patient's family history includes Aneurysm in his sister; CVA in his mother; Diabetes in his brother; Heart attack in his father; Heart disease in his brother and sister; Heart failure in his brother; Hypertension in his brother; Kidney failure in his father; Lung disease in his brother.   ROS:   Please see the history of present illness.    Leg pain, decreased hearing, back discomfort, stress related to his wife being in skilled nursing home. Irregular heartbeat. Some shortness of breath with activity.  All other systems reviewed and are negative.   PHYSICAL EXAM:   VS:  BP 114/72 (BP Location: Left Arm)   Pulse 91    GEN: Well nourished, well developed, in no acute distress  HEENT: normal  Neck: no JVD, carotid bruits, or masses Cardiac: IRR; no murmurs, rubs, or gallops,no edema  Respiratory:  clear to auscultation bilaterally, normal work of breathing GI: soft, nontender, nondistended, + BS MS: no deformity or atrophy  Skin: warm and dry, no rash Neuro:  Alert and Oriented x 3, Strength and sensation are intact Psych: euthymic mood, full affect  Wt Readings from Last 3 Encounters:  09/13/16 165 lb 9.6 oz (75.1 kg)  09/10/16 165 lb 11.2 oz (75.2 kg)  09/02/16 165 lb 12.8 oz (75.2 kg)      Studies/Labs Reviewed:   EKG:  EKG  Reveals third-degree heart block, junctional escape, occasional atrial capture. Discussed with Dr. Caryl Comes.  Recent Labs: 09/10/2016: BUN 22; Creatinine, Ser 1.48; Hemoglobin  13.7; Platelets 233; Potassium 3.8; Sodium 138   Lipid Panel    Component Value Date/Time   CHOL 140 06/30/2014 1025   TRIG 48.0 06/30/2014 1025   HDL 45.80 06/30/2014 1025   CHOLHDL 3 06/30/2014 1025   VLDL 9.6 06/30/2014 1025   LDLCALC 85 06/30/2014 1025    Additional studies/ records that were reviewed today include:  No new data    ASSESSMENT:    1. Accelerated junctional rhythm   2. Third degree heart block ([place])   3. Coronary artery disease of native artery of native heart with stable angina pectoris ([place])   4.  Other hyperlipidemia      PLAN:  In order of problems listed above:  1. Will do a 48-hour Holter monitor. After speaking with EP Caryl Comes), advised that if patient is asymptomatic nothing needs to be done at this time. 2. Same as above. 3. Asymptomatic. 4. Not addressed.  I have decided to do a 53-ZJQB Holter to be certain we are not missing episodes of significant bradycardia that may place the patient at risk. After cautioned that he has severe dizziness or syncope, we should be informed immediately. If symptoms develop or we see significant bradycardia on the 48-hour Holter, I will again raises a question of permanent pacemaker. This has been considered in the past but EP did not feel pacing was necessary. Return to see me in May as scheduled or earlier if monitor shows significant abnormality.  Medication Adjustments/Labs and Tests Ordered: Current medicines are reviewed at length with the patient today.  Concerns regarding medicines are outlined above.  Medication changes, Labs and Tests ordered today are listed in the Patient Instructions below. Patient Instructions  Medication Instructions:  None  Labwork: None  Testing/Procedures: Your physician has recommended that you wear a 48 hour holter monitor as soon as possible. Holter monitors are medical devices that record the heart's electrical activity. Doctors most often use these monitors to diagnose  arrhythmias. Arrhythmias are problems with the speed or rhythm of the heartbeat. The monitor is a small, portable device. You can wear one while you do your normal daily activities. This is usually used to diagnose what is causing palpitations/syncope (passing out).    Follow-Up: Keep current follow up in May with Dr. Tamala Julian.   Any Other Special Instructions Will Be Listed Below (If Applicable).     If you need a refill on your cardiac medications before your next appointment, please call your pharmacy.      Signed, Sinclair Grooms, MD  12/29/2016 9:16 AM    [facility] [place], [place],   [address] Phone: [phone]; Fax: [phone]

## 2016-12-30 ENCOUNTER — Ambulatory Visit: Payer: Medicare Other | Admitting: Interventional Cardiology

## 2016-12-30 ENCOUNTER — Ambulatory Visit (INDEPENDENT_AMBULATORY_CARE_PROVIDER_SITE_OTHER): Payer: Medicare Other

## 2016-12-30 ENCOUNTER — Other Ambulatory Visit: Payer: Self-pay | Admitting: Interventional Cardiology

## 2016-12-30 DIAGNOSIS — I498 Other specified cardiac arrhythmias: Secondary | ICD-10-CM

## 2016-12-30 DIAGNOSIS — I442 Atrioventricular block, complete: Secondary | ICD-10-CM

## 2017-01-24 ENCOUNTER — Telehealth: Payer: Self-pay | Admitting: Interventional Cardiology

## 2017-01-24 NOTE — Telephone Encounter (Signed)
Informed pt of monitor results. Pt verbalized understanding. 

## 2017-01-24 NOTE — Telephone Encounter (Signed)
New Message  Pt call stating he was returning RN call. Please call back to discuss

## 2017-02-24 ENCOUNTER — Telehealth: Payer: Self-pay | Admitting: Interventional Cardiology

## 2017-02-24 NOTE — Telephone Encounter (Signed)
Spoke with the patient. He states he has noticed that his HR was running around 98 yesterday with activity.  He was around 45 bpm this morning and is back up to 90 bpm now. He denies dizziness, lightheadedness, syncope, or pre-syncope. 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 I advised him that Dr. Tamala Julian is aware that he does have some abnormality in his heart rhythm, but due to the fact that he has been asymptomatic, no further intervention has been needed. The patient is scheduled to follow up with Dr. Tamala Julian on 03/01/17.  I advised him I will forward this message to Dr. Tamala Julian to review, but if he has any symptoms over the weekend of syncope/ presyncope, he should report to the ER.   He voices understanding.   Previous holter report from 12/30/16 shows: Notes recorded by Belva Crome, MD on 01/21/2017 at 2:42 PM EDT Let the patient know the monitor is abnormal but simply requires continued follow-up at this time.

## 2017-02-24 NOTE — Telephone Encounter (Signed)
New Message  Pt voiced hr around 45 or 50 while sitting and wanting to speak with someone.  Pt voiced he may have blockage.  Please

## 2017-02-25 NOTE — Telephone Encounter (Signed)
Agree 

## 2017-02-28 NOTE — Progress Notes (Signed)
Cardiology Office Note    Date:  03/01/2017   ID:  ALIZE ACY, DOB October 04, 1926, MRN [id number]  PCP:  Lavone Orn, MD  Cardiologist: Sinclair Grooms, MD   Chief Complaint  Patient presents with  . Coronary Artery Disease  . Atrial Fibrillation    History of Present Illness:  Brandon Robles is a 83 y.o. male who presents for coronary artery disease with prior non-drug-eluting stent implantation during acute inferior in 2001and LAD bare-metal stent 2007, hypertension, hyperlipidemia, sinus node dysfunction, complete heart block, accelerated junctional rhythm and history of pericarditis.  He is doing well. He has not had syncope. Exertional tolerance is stable. He saw Dr. Lovena Le who felt that continued watchful waiting was the treatment of choice for his AV block. No chest discomfort. Exertional interscapular discomfort that resolves with rest. Episodes are unpredictable both in onset and offset.[name] He is not currently on any medications that slow AV conduction.  Past Medical History:  Diagnosis Date  . Blastocystis hominis   . CAD (coronary artery disease)    S/P inferior MI 07/2000 with RCA stenting at that time. In 03/2006 he had a LAD non-drug-eluting stent implanted.  . Chronic insomnia   . Complex renal cyst    Left  . Diverticulitis   . ED (erectile dysfunction)    Vacuum pump  . Essential hypertension, benign    Stable, on lisinopril-HCTZ & Atenolol.  [name] GERD (gastroesophageal reflux disease)   . H/O pericarditis   . Hemorrhoids   . LBBB (left bundle branch block)   . Multiple rib fractures 09/25/10   PTX 09/2010  . Nephrolithiasis    Renal stone basketing.  . Pure hypercholesterolemia    On Red Yeast Rice & CoQ 10.  . Sinoatrial node dysfunction (HCC)    EC Holter 24hr 06/24/13 showed sinus bradycardia with probable chronotropic incompetence. May need pacemaker per Dr. Daneen Schick.  . Skin cancer    Left base (Face) & (Ear in 12/2011).    Past Surgical History:    Procedure Laterality Date  . CORONARY ANGIOPLASTY WITH STENT PLACEMENT  07/25/00   MI in 07/25/00 with RCA stenting at that time.  . CORONARY ANGIOPLASTY WITH STENT PLACEMENT  04/14/06   Bare metal stent in proximal LAD [facility] [employer] - 3.5 x16 stent) by Dr. Linard Millers.  [name] PROSTATE BIOPSY     Evidence  . RIGHT THORACOTOMY,OPEN REDUCTION AND INTERNAL FIXATION OF RIBS 4-7  09/25/2010  . STONE EXTRACTION WITH BASKET     Renal    Current Medications: Outpatient Medications Prior to Visit  Medication Sig Dispense Refill  . acetaminophen (TYLENOL) 325 MG tablet Take 2 tablets (650 mg total) by mouth every 4 (four) hours as needed for mild pain.    [name] aspirin EC 81 MG tablet Take 81 mg by mouth daily.    [name] lisinopril-hydrochlorothiazide (PRINZIDE,ZESTORETIC) 10-12.5 MG tablet Take 1 tablet by mouth daily. 90 tablet 3  . nitroGLYCERIN (NITROSTAT) 0.4 MG SL tablet Place 0.4 mg under the tongue every 5 (five) minutes as needed for chest pain. (MAXIMUM of 3 TABLETS)    . OVER THE COUNTER MEDICATION Take 2 capsules by mouth daily. OTC [place] capsules    . Red Yeast Rice 600 MG CAPS Take 1 capsule by mouth daily.     . temazepam (RESTORIL) 30 MG capsule Take 30 mg by mouth at bedtime.     . Throat Lozenges (FISHERMANS FRIEND MT) Use as directed 1 lozenge  in the mouth or throat 2 (two) times daily as needed (congestion).    . Coenzyme Q10 (COQ10) 100 MG CAPS Take 100 mg by mouth daily.      No facility-administered medications prior to visit.      Allergies:   Vitamin d analogs; Ambien [zolpidem tartrate]; and Honey   Social History   Social History  . Marital status: Single    Spouse name: N/A  . Number of children: N/A  . Years of education: N/A   Occupational History  . Farming Retired   Social History Main Topics  . Smoking status: Former Smoker    Types: Cigars  . Smokeless tobacco: Never Used     Comment: quit 1990  . Alcohol use Yes     Comment: NOT ON A REGULAR  BASIS  . Drug use: No  . Sexual activity: Not Asked   Other Topics Concern  . None   Social History Narrative  . None     Family History:  The patient's family history includes Aneurysm in his sister; CVA in his mother; Diabetes in his brother; Heart attack in his father; Heart disease in his brother and sister; Heart failure in his brother; Hypertension in his brother; Kidney failure in his father; Lung disease in his brother.   ROS:   Please see the history of present illness.    Muscle aches and pains. Shortness of breath with activity. Occasional palpitations.  All other systems reviewed and are negative.   PHYSICAL EXAM:   VS:  BP (!) 152/78 (BP Location: Right Arm)   Pulse (!) 56   Ht 5' 6.5" (1.689 m)   Wt 165 lb 12.8 oz (75.2 kg)   BMI 26.36 kg/m    GEN: Well nourished, well developed, in no acute distress  HEENT: normal  Neck: no JVD, carotid bruits, or masses Cardiac: IRR; no murmurs, rubs, or gallops,no edema  Respiratory:  clear to auscultation bilaterally, normal work of breathing GI: soft, nontender, nondistended, + BS MS: no deformity or atrophy  Skin: warm and dry, no rash Neuro:  Alert and Oriented x 3, Strength and sensation are intact Psych: euthymic mood, full affect  Wt Readings from Last 3 Encounters:  03/01/17 165 lb 12.8 oz (75.2 kg)  09/13/16 165 lb 9.6 oz (75.1 kg)  09/10/16 165 lb 11.2 oz (75.2 kg)      Studies/Labs Reviewed:   EKG:  EKG  Not repeated.  Recent Labs: 09/10/2016: BUN 22; Creatinine, Ser 1.48; Hemoglobin 13.7; Platelets 233; Potassium 3.8; Sodium 138   Lipid Panel    Component Value Date/Time   CHOL 140 06/30/2014 1025   TRIG 48.0 06/30/2014 1025   HDL 45.80 06/30/2014 1025   CHOLHDL 3 06/30/2014 1025   VLDL 9.6 06/30/2014 1025   LDLCALC 85 06/30/2014 1025    Additional studies/ records that were reviewed today include:   48 hour Holter monitor 12/30/16: Study Highlights     Sinus rhythm with first, second,  and third-degree AV block   Abnormal study with sinus rhythm, and AV block ranging from first degree to third degree. Bradycardia is slowest 33 bpm. Asymptomatic.  Minimum HR: 32 BPM at 5:23:38 AM(2) Maximum HR: 112 BPM at 3:08:28 PM Average HR: 67 BPM    Findings were very similar to prior studies. EP evaluation is recommended watchful waiting   ASSESSMENT:    1. Coronary artery disease involving native coronary artery of native heart with other form of angina pectoris ([place])  2. Third degree heart block ([place])   3. Traumatic subdural hematoma without loss of consciousness, subsequent encounter   4. Bradycardia   5. Pure hypercholesterolemia      PLAN:  In order of problems listed above:  1. Stable angina which could be the interscapular discomfort he has with continuous walking. He should be cognizant of the possible association with coronary disease and if symptom worsens (occurs more frequently, last longer after onset, is not relieved by rest, etc.) he should inform us. Continue aspirin daily. 2. Intermittent high-grade AV block, most notable at night. Plan continued observation. 3. Not addressed 4. Sinus node and AV node disease, watchful waiting 5. Not addressed  Clinical follow-up in 6-9 months. Notify us of fainting, near fainting, or extreme unexplained exhaustion.    Medication Adjustments/Labs and Tests Ordered: Current medicines are reviewed at length with the patient today.  Concerns regarding medicines are outlined above.  Medication changes, Labs and Tests ordered today are listed in the Patient Instructions below. There are no Patient Instructions on file for this visit.   Signed, Sinclair Grooms, MD  03/01/2017 4:47 PM    [facility] [place], [place],   [address] Phone: [phone]; Fax: [phone]

## 2017-03-01 ENCOUNTER — Encounter (INDEPENDENT_AMBULATORY_CARE_PROVIDER_SITE_OTHER): Payer: Self-pay

## 2017-03-01 ENCOUNTER — Encounter: Payer: Self-pay | Admitting: Interventional Cardiology

## 2017-03-01 ENCOUNTER — Ambulatory Visit (INDEPENDENT_AMBULATORY_CARE_PROVIDER_SITE_OTHER): Payer: Medicare Other | Admitting: Interventional Cardiology

## 2017-03-01 VITALS — BP 152/78 | HR 56 | Ht 66.5 in | Wt 165.8 lb

## 2017-03-01 DIAGNOSIS — R001 Bradycardia, unspecified: Secondary | ICD-10-CM | POA: Diagnosis not present

## 2017-03-01 DIAGNOSIS — I442 Atrioventricular block, complete: Secondary | ICD-10-CM

## 2017-03-01 DIAGNOSIS — S065X0D Traumatic subdural hemorrhage without loss of consciousness, subsequent encounter: Secondary | ICD-10-CM

## 2017-03-01 DIAGNOSIS — I25118 Atherosclerotic heart disease of native coronary artery with other forms of angina pectoris: Secondary | ICD-10-CM | POA: Diagnosis not present

## 2017-03-01 DIAGNOSIS — E7800 Pure hypercholesterolemia, unspecified: Secondary | ICD-10-CM | POA: Diagnosis not present

## 2017-03-01 NOTE — Patient Instructions (Signed)
Medication Instructions:  None  Labwork: None  Testing/Procedures: None  Follow-Up: Your physician wants you to follow-up in: 9 months with Dr. Tamala Julian.  You will receive a reminder letter in the mail two months in advance. If you don't receive a letter, please call our office to schedule the follow-up appointment.   Any Other Special Instructions Will Be Listed Below (If Applicable).     If you need a refill on your cardiac medications before your next appointment, please call your pharmacy.

## 2017-03-15 DIAGNOSIS — Z85828 Personal history of other malignant neoplasm of skin: Secondary | ICD-10-CM | POA: Diagnosis not present

## 2017-03-15 DIAGNOSIS — D225 Melanocytic nevi of trunk: Secondary | ICD-10-CM | POA: Diagnosis not present

## 2017-03-15 DIAGNOSIS — L821 Other seborrheic keratosis: Secondary | ICD-10-CM | POA: Diagnosis not present

## 2017-03-15 DIAGNOSIS — D2262 Melanocytic nevi of left upper limb, including shoulder: Secondary | ICD-10-CM | POA: Diagnosis not present

## 2017-06-05 DIAGNOSIS — Z0000 Encounter for general adult medical examination without abnormal findings: Secondary | ICD-10-CM | POA: Diagnosis not present

## 2017-06-05 DIAGNOSIS — I129 Hypertensive chronic kidney disease with stage 1 through stage 4 chronic kidney disease, or unspecified chronic kidney disease: Secondary | ICD-10-CM | POA: Diagnosis not present

## 2017-06-05 DIAGNOSIS — N183 Chronic kidney disease, stage 3 (moderate): Secondary | ICD-10-CM | POA: Diagnosis not present

## 2017-06-05 DIAGNOSIS — Z1389 Encounter for screening for other disorder: Secondary | ICD-10-CM | POA: Diagnosis not present

## 2017-07-26 DIAGNOSIS — Z23 Encounter for immunization: Secondary | ICD-10-CM | POA: Diagnosis not present

## 2017-09-26 ENCOUNTER — Encounter: Payer: Self-pay | Admitting: Internal Medicine

## 2017-10-09 ENCOUNTER — Ambulatory Visit: Payer: Medicare Other | Admitting: Internal Medicine

## 2017-10-09 ENCOUNTER — Encounter: Payer: Self-pay | Admitting: Internal Medicine

## 2017-10-09 VITALS — BP 158/80 | HR 56 | Ht 67.0 in | Wt 165.2 lb

## 2017-10-09 DIAGNOSIS — R001 Bradycardia, unspecified: Secondary | ICD-10-CM

## 2017-10-09 DIAGNOSIS — I10 Essential (primary) hypertension: Secondary | ICD-10-CM | POA: Diagnosis not present

## 2017-10-09 NOTE — Patient Instructions (Addendum)

## 2017-10-09 NOTE — Progress Notes (Signed)
HPI [name] returns today for followup. He is a pleasant now 83 yo man with a h/o sinus bradycardia and heart block who despite his slow heart rate remains asymptomatic. 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 He remains active working outside and despite being very active, has no limitation in active except that when he is lifting heavy items, his shoulder and chest hurt. He can walk without any pain but does get dyspneic. He still runs a chain saw.  Allergies  Allergen Reactions  . Vitamin D Analogs Other (See Comments)    "pins and needles feeling"  . Ambien [Zolpidem Tartrate] Other (See Comments)    Confusion  . Honey Other (See Comments)    "pins and needles feeling"     Current Outpatient Medications  Medication Sig Dispense Refill  . acetaminophen (TYLENOL) 325 MG tablet Take 2 tablets (650 mg total) by mouth every 4 (four) hours as needed for mild pain.    [name] amoxicillin (AMOXIL) 500 MG capsule Take 4 capsules by mouth as needed 1 hour prior to dental procedures.  0  . aspirin EC 81 MG tablet Take 81 mg by mouth daily.    [name] lisinopril-hydrochlorothiazide (PRINZIDE,ZESTORETIC) 10-12.5 MG tablet Take 1 tablet by mouth daily. 90 tablet 3  . nitroGLYCERIN (NITROSTAT) 0.4 MG SL tablet Place 0.4 mg under the tongue every 5 (five) minutes as needed for chest pain. (MAXIMUM of 3 TABLETS)    . OVER THE COUNTER MEDICATION Take 2 capsules by mouth daily. OTC [place] capsules    . OVER THE COUNTER MEDICATION Take 1 capsule by mouth daily. OMEGA Q PLUS    . Red Yeast Rice 600 MG CAPS Take 1 capsule by mouth daily.     . temazepam (RESTORIL) 30 MG capsule Take 30 mg by mouth at bedtime.     . Throat Lozenges (FISHERMANS FRIEND MT) Use as directed 1 lozenge in the mouth or throat 2 (two) times daily as needed (congestion).     No current facility-administered medications for this visit.      Past Medical History:  Diagnosis Date  . Blastocystis hominis   . CAD (coronary  artery disease)    S/P inferior MI 07/2000 with RCA stenting at that time. In 03/2006 he had a LAD non-drug-eluting stent implanted.  . Chronic insomnia   . Complex renal cyst    Left  . Diverticulitis   . ED (erectile dysfunction)    Vacuum pump  . Essential hypertension, benign    Stable, on lisinopril-HCTZ & Atenolol.  [name] GERD (gastroesophageal reflux disease)   . H/O pericarditis   . Hemorrhoids   . LBBB (left bundle branch block)   . Multiple rib fractures 09/25/10   PTX 09/2010  . Nephrolithiasis    Renal stone basketing.  . Pure hypercholesterolemia    On Red Yeast Rice & CoQ 10.  . Sinoatrial node dysfunction (HCC)    EC Holter 24hr 06/24/13 showed sinus bradycardia with probable chronotropic incompetence. May need pacemaker per Dr. Daneen Schick.  . Skin cancer    Left base (Face) & (Ear in 12/2011).    ROS:   All systems reviewed and negative except as noted in the HPI.   Past Surgical History:  Procedure Laterality Date  . CORONARY ANGIOPLASTY WITH STENT PLACEMENT  07/25/00   MI in 07/25/00 with RCA stenting at that time.  . CORONARY ANGIOPLASTY WITH STENT PLACEMENT  04/14/06   Bare metal stent in  proximal LAD [facility] [employer] - 3.5 x16 stent) by Dr. Linard Millers.  [name] PROSTATE BIOPSY     Evidence  . RIGHT THORACOTOMY,OPEN REDUCTION AND INTERNAL FIXATION OF RIBS 4-7  09/25/2010  . STONE EXTRACTION WITH BASKET     Renal     Family History  Problem Relation Age of Onset  . CVA Mother   . Heart attack Father   . Kidney failure Father   . Heart disease Sister        Possible heart disease  . Aneurysm Sister   . Hypertension Brother   . Heart disease Brother   . Heart failure Brother   . Lung disease Brother   . Diabetes Brother      Social History   Socioeconomic History  . Marital status: Single    Spouse name: Not on file  . Number of children: Not on file  . Years of education: Not on file  . Highest education level: Not on file  Social Needs    . Financial resource strain: Not on file  . Food insecurity - worry: Not on file  . Food insecurity - inability: Not on file  . Transportation needs - medical: Not on file  . Transportation needs - non-medical: Not on file  Occupational History  . Occupation: Engineer, technical sales: RETIRED  Tobacco Use  . Smoking status: Former Smoker    Types: Cigars  . Smokeless tobacco: Never Used  . Tobacco comment: quit 1990  Substance and Sexual Activity  . Alcohol use: Yes    Comment: NOT ON A REGULAR BASIS  . Drug use: No  . Sexual activity: Not on file  Other Topics Concern  . Not on file  Social History Narrative  . Not on file     BP (!) 158/80   Pulse (!) 56   Ht 5\' 7"  (1.702 m)   Wt 165 lb 3.2 oz (74.9 kg)   BMI 25.87 kg/m   Physical Exam:  Well appearing 83 yo man, NAD HEENT: Unremarkable Neck:  6 cm JVD, no thyromegally Lymphatics:  No adenopathy Back:  No CVA tenderness Lungs:  Clear with no wheezes HEART:  Regular rate rhythm, no murmurs, no rubs, no clicks Abd:  soft, positive bowel sounds, no organomegally, no rebound, no guarding Ext:  2 plus pulses, no edema, no cyanosis, no clubbing Skin:  No rashes no nodules Neuro:  CN II through XII intact, motor grossly intact  EKG - sinus brady with AVWB   Assess/Plan: 1. Sinus node dysfunction/AVWB - he has known significant bradycardia but despite being active, is asymptomatic. I have again discussed the warning signs that he might experience if he develops heart block. He will call us if these develop. He does not have a clear cut indication for PPM at this time. 2. HTN - his blood pressure is elevated today but he states that at home it is not. He will continue his current meds. I have asked him to maintain a low sodium diet. 3. CAD - he has some shoulder and chest tightness. Unclear if this is angina or arthritis or both. If it gets worse, he is instructed to call Dr. Linard Millers.  Mikle Bosworth.D.

## 2017-10-26 ENCOUNTER — Ambulatory Visit: Payer: Medicare Other | Admitting: Internal Medicine

## 2017-12-11 DIAGNOSIS — I10 Essential (primary) hypertension: Secondary | ICD-10-CM | POA: Diagnosis not present

## 2018-01-09 DIAGNOSIS — H26493 Other secondary cataract, bilateral: Secondary | ICD-10-CM | POA: Diagnosis not present

## 2018-01-09 DIAGNOSIS — D3132 Benign neoplasm of left choroid: Secondary | ICD-10-CM | POA: Diagnosis not present

## 2018-01-09 DIAGNOSIS — H353131 Nonexudative age-related macular degeneration, bilateral, early dry stage: Secondary | ICD-10-CM | POA: Diagnosis not present

## 2018-01-15 ENCOUNTER — Other Ambulatory Visit: Payer: Self-pay | Admitting: Interventional Cardiology

## 2018-01-15 MED ORDER — LISINOPRIL-HYDROCHLOROTHIAZIDE 10-12.5 MG PO TABS: 1.0000 | ORAL_TABLET | Freq: Every day | ORAL | 0 refills | Status: DC

## 2018-02-27 ENCOUNTER — Other Ambulatory Visit: Payer: Self-pay | Admitting: *Deleted

## 2018-02-27 MED ORDER — LISINOPRIL-HYDROCHLOROTHIAZIDE 10-12.5 MG PO TABS: 1.0000 | ORAL_TABLET | Freq: Every day | ORAL | 0 refills | Status: DC

## 2018-03-01 DIAGNOSIS — J019 Acute sinusitis, unspecified: Secondary | ICD-10-CM | POA: Diagnosis not present

## 2018-03-01 DIAGNOSIS — R070 Pain in throat: Secondary | ICD-10-CM | POA: Diagnosis not present

## 2018-03-02 ENCOUNTER — Other Ambulatory Visit: Payer: Self-pay | Admitting: *Deleted

## 2018-03-02 ENCOUNTER — Telehealth: Payer: Self-pay | Admitting: Interventional Cardiology

## 2018-03-02 MED ORDER — LISINOPRIL-HYDROCHLOROTHIAZIDE 10-12.5 MG PO TABS: 1.0000 | ORAL_TABLET | Freq: Every day | ORAL | 1 refills | Status: DC

## 2018-03-02 NOTE — Telephone Encounter (Signed)
New message    *STAT* If patient is at the pharmacy, call can be transferred to refill team.   1. Which medications need to be refilled? (please list name of each medication and dose if known) lisinopril-hydrochlorothiazide (PRINZIDE,ZESTORETIC) 10-12.5 MG tablet  2. Which pharmacy/location (including street and city if local pharmacy) is medication to be sent to?[place], [place] 3. Do they need a 30 day or 90 day supply? [place]

## 2018-03-19 DIAGNOSIS — D485 Neoplasm of uncertain behavior of skin: Secondary | ICD-10-CM | POA: Diagnosis not present

## 2018-03-19 DIAGNOSIS — Z85828 Personal history of other malignant neoplasm of skin: Secondary | ICD-10-CM | POA: Diagnosis not present

## 2018-03-19 DIAGNOSIS — D2372 Other benign neoplasm of skin of left lower limb, including hip: Secondary | ICD-10-CM | POA: Diagnosis not present

## 2018-03-19 DIAGNOSIS — C44619 Basal cell carcinoma of skin of left upper limb, including shoulder: Secondary | ICD-10-CM | POA: Diagnosis not present

## 2018-03-19 DIAGNOSIS — L821 Other seborrheic keratosis: Secondary | ICD-10-CM | POA: Diagnosis not present

## 2018-03-19 DIAGNOSIS — L820 Inflamed seborrheic keratosis: Secondary | ICD-10-CM | POA: Diagnosis not present

## 2018-04-20 NOTE — Progress Notes (Signed)
Cardiology Office Note   Date:  04/23/2018   ID:  BERTRAND VOWELS, DOB 05/09/1926, MRN [id number]  PCP:  Lavone Orn, MD  Cardiologist:  Dr. Tamala Julian  EP:  Dr. Lovena Le    Chief Complaint  Patient presents with  . Coronary Artery Disease    no chest pain      History of Present Illness: Brandon Robles is a 84 y.o. male who presents for CAD with prior non drug eluding stent during acute inf. MI in 2001 and LAD BMS in 2007.    Pt also has hx of HTN, HLD, CHB, and junct rhythm and asymptomatic --watching for PPM need and hx of pericarditis.  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  Today he does have occ pain Lt ant chest he rubs it and it goes away.  Also with some increase with DOE.  No lightheadedness, no syncope, with rest HR to 44 but once moving his HR is up to 60s.  With the slower HR and sitting his vision seem a little blurred.  But resolves when up and walking.    He last saw Dr. Lovena Le in 09/2017 and is followed by EP yearly.        Past Medical History:  Diagnosis Date  . Blastocystis hominis   . CAD (coronary artery disease)    S/P inferior MI 07/2000 with RCA stenting at that time. In 03/2006 he had a LAD non-drug-eluting stent implanted.  . Chronic insomnia   . Complex renal cyst    Left  . Diverticulitis   . ED (erectile dysfunction)    Vacuum pump  . Essential hypertension, benign    Stable, on lisinopril-HCTZ & Atenolol.  [name] GERD (gastroesophageal reflux disease)   . H/O pericarditis   . Hemorrhoids   . LBBB (left bundle branch block)   . Multiple rib fractures 09/25/10   PTX 09/2010  . Nephrolithiasis    Renal stone basketing.  . Pure hypercholesterolemia    On Red Yeast Rice & CoQ 10.  . Sinoatrial node dysfunction (HCC)    EC Holter 24hr 06/24/13 showed sinus bradycardia with probable chronotropic incompetence. May need pacemaker per Dr. Daneen Schick.  . Skin cancer    Left base (Face) & (Ear in 12/2011).    Past Surgical History:  Procedure Laterality Date  .  CORONARY ANGIOPLASTY WITH STENT PLACEMENT  07/25/00   MI in 07/25/00 with RCA stenting at that time.  . CORONARY ANGIOPLASTY WITH STENT PLACEMENT  04/14/06   Bare metal stent in proximal LAD [facility] - 3.5 x16 stent) by Dr. Linard Millers.  [name] PROSTATE BIOPSY     Evidence  . RIGHT THORACOTOMY,OPEN REDUCTION AND INTERNAL FIXATION OF RIBS 4-7  09/25/2010  . STONE EXTRACTION WITH BASKET     Renal     Current Outpatient Medications  Medication Sig Dispense Refill  . acetaminophen (TYLENOL) 325 MG tablet Take 2 tablets (650 mg total) by mouth every 4 (four) hours as needed for mild pain.    [name] amoxicillin (AMOXIL) 500 MG capsule Take 4 capsules by mouth as needed 1 hour prior to dental procedures.  0  . aspirin EC 81 MG tablet Take 81 mg by mouth daily.    [name] lisinopril-hydrochlorothiazide (PRINZIDE,ZESTORETIC) 10-12.5 MG tablet Take 1 tablet by mouth daily. Please keep appointment 30 tablet 1  . nitroGLYCERIN (NITROSTAT) 0.4 MG SL tablet Place 0.4 mg under the tongue every 5 (five) minutes as needed for chest pain. (MAXIMUM of 3  TABLETS)    . OVER THE COUNTER MEDICATION Take 2 capsules by mouth daily. OTC [place] capsules    . OVER THE COUNTER MEDICATION Take 1 capsule by mouth daily. OMEGA Q PLUS    . Red Yeast Rice 600 MG CAPS Take 1 capsule by mouth daily.     . temazepam (RESTORIL) 30 MG capsule Take 30 mg by mouth at bedtime.     . Throat Lozenges ([employer] FRIEND MT) Use as directed 1 lozenge in the mouth or throat 2 (two) times daily as needed (congestion).     No current facility-administered medications for this visit.     Allergies:   Vitamin d analogs; Ambien [zolpidem tartrate]; and Honey    Social History:  The patient  reports that he has quit smoking. His smoking use included cigars. He has never used smokeless tobacco. He reports that he drinks alcohol. He reports that he does not use drugs.   Family History:  The patient's family history includes Aneurysm in  his sister; CVA in his mother; Diabetes in his brother; Heart attack in his father; Heart disease in his brother and sister; Heart failure in his brother; Hypertension in his brother; Kidney failure in his father; Lung disease in his brother.    ROS:  General:no colds or fevers, + weight loss of 5 lbs.  Skin:no rashes or ulcers HEENT:no blurred vision, no congestion CV:see HPI PUL:see HPI GI:no diarrhea constipation or melena, no indigestion GU:no hematuria, no dysuria MS:no joint pain, no claudication Neuro:no syncope, no lightheadedness Endo:no diabetes, no thyroid disease  Wt Readings from Last 3 Encounters:  04/23/18 160 lb 8 oz (72.8 kg)  10/09/17 165 lb 3.2 oz (74.9 kg)  03/01/17 165 lb 12.8 oz (75.2 kg)     PHYSICAL EXAM: VS:  BP 128/68   Pulse 82   Ht 5\' 7"  (1.702 m)   Wt 160 lb 8 oz (72.8 kg)   SpO2 99%   BMI 25.14 kg/m  , BMI Body mass index is 25.14 kg/m. General:Pleasant affect, NAD Skin:Warm and dry, brisk capillary refill HEENT:normocephalic, sclera clear, mucus membranes moist Neck:supple, no JVD, no bruits  Heart:S1S2 irreg with soft murmur, no gallup, rub or click Lungs:clear without rales, rhonchi, or wheezes QQP:YPPJ, non tender, + BS, do not palpate liver spleen or masses Ext:no lower ext edema, 2+ pedal pulses, 2+ radial pulses Neuro:alert and oriented X 3, MAE, follows commands, + facial symmetry    EKG:  EKG is ordered today. The ekg ordered today demonstrates mobitz I with HR at 52.     Recent Labs: No results found for requested labs within last 8760 hours.    Lipid Panel    Component Value Date/Time   CHOL 140 06/30/2014 1025   TRIG 48.0 06/30/2014 1025   HDL 45.80 06/30/2014 1025   CHOLHDL 3 06/30/2014 1025   VLDL 9.6 06/30/2014 1025   LDLCALC 85 06/30/2014 1025       Other studies Reviewed: Additional studies/ records that were reviewed today include: holter last year. With SR with 1st, second and third degree AV block.      ASSESSMENT AND PLAN:  1.  Heart block with Mobitz 1 today but hx of 2nd and third degree, and now with blurred vision while sitting.  Once walking symptoms resolve.  Discussed with Dr. Tamala Julian and will have pt wear 24 hour holter monitor.  This will give [place] better idea of HR.  No syncope  Follow up with Dr. Tamala Julian in  6 months and Dr. Lovena Le in Dec.   2.   CAD with hx of DES in 2001 and BMS in 2007, no acute EKG changes but + LBBB.     3.   Hx of traumatic subdural hematoma  4.   HLD stable on Red yeast per PCP  5.   Chest pain.  Does not seem to be cardiac but MSK gone in seconds.     Current medicines are reviewed with the patient today.  The patient Has no concerns regarding medicines.  The following changes have been made:  See above Labs/ tests ordered today include:see above  Disposition:   FU:  see above  Signed, Cecilie Kicks, NP  04/23/2018 9:13 AM    [facility], [place], [place], [place] Phone: [phone]; Fax: [phone]

## 2018-04-23 ENCOUNTER — Encounter (INDEPENDENT_AMBULATORY_CARE_PROVIDER_SITE_OTHER): Payer: Self-pay

## 2018-04-23 ENCOUNTER — Encounter: Payer: Self-pay | Admitting: Cardiology

## 2018-04-23 ENCOUNTER — Ambulatory Visit: Payer: Medicare Other | Admitting: Cardiology

## 2018-04-23 VITALS — BP 128/68 | HR 82 | Ht 67.0 in | Wt 160.5 lb

## 2018-04-23 DIAGNOSIS — Z87828 Personal history of other (healed) physical injury and trauma: Secondary | ICD-10-CM

## 2018-04-23 DIAGNOSIS — I2510 Atherosclerotic heart disease of native coronary artery without angina pectoris: Secondary | ICD-10-CM

## 2018-04-23 DIAGNOSIS — R001 Bradycardia, unspecified: Secondary | ICD-10-CM | POA: Diagnosis not present

## 2018-04-23 DIAGNOSIS — R079 Chest pain, unspecified: Secondary | ICD-10-CM

## 2018-04-23 DIAGNOSIS — E7800 Pure hypercholesterolemia, unspecified: Secondary | ICD-10-CM

## 2018-04-23 DIAGNOSIS — I442 Atrioventricular block, complete: Secondary | ICD-10-CM | POA: Diagnosis not present

## 2018-04-23 NOTE — Patient Instructions (Signed)
Medication Instructions:  Your physician recommends that you continue on your current medications as directed. Please refer to the Current Medication list given to you today.   Labwork: None ordered  Testing/Procedures: Your physician has recommended that you wear a 24-holter monitor. Holter monitors are medical devices that record the heart's electrical activity. Doctors most often use these monitors to diagnose arrhythmias. Arrhythmias are problems with the speed or rhythm of the heartbeat. The monitor is a small, portable device. You can wear one while you do your normal daily activities. This is usually used to diagnose what is causing palpitations/syncope (passing out).    Follow-Up: Your physician wants you to follow-up in: 6 months with Dr. Tamala Julian.   Any Other Special Instructions Will Be Listed Below (If Applicable).     If you need a refill on your cardiac medications before your next appointment, please call your pharmacy.

## 2018-04-26 ENCOUNTER — Ambulatory Visit (INDEPENDENT_AMBULATORY_CARE_PROVIDER_SITE_OTHER): Payer: Medicare Other

## 2018-04-26 DIAGNOSIS — I442 Atrioventricular block, complete: Secondary | ICD-10-CM | POA: Diagnosis not present

## 2018-04-26 DIAGNOSIS — R001 Bradycardia, unspecified: Secondary | ICD-10-CM | POA: Diagnosis not present

## 2018-05-13 ENCOUNTER — Emergency Department (HOSPITAL_COMMUNITY): Payer: Medicare Other

## 2018-05-13 ENCOUNTER — Encounter (HOSPITAL_COMMUNITY): Payer: Self-pay

## 2018-05-13 ENCOUNTER — Emergency Department (HOSPITAL_COMMUNITY)
Admission: EM | Admit: 2018-05-13 | Discharge: 2018-05-13 | Disposition: A | Discharge status: Home / Self Care | Payer: Medicare Other | Attending: Emergency Medicine | Admitting: Emergency Medicine

## 2018-05-13 DIAGNOSIS — R0789 Other chest pain: Secondary | ICD-10-CM | POA: Diagnosis not present

## 2018-05-13 DIAGNOSIS — Z7982 Long term (current) use of aspirin: Secondary | ICD-10-CM | POA: Insufficient documentation

## 2018-05-13 DIAGNOSIS — R079 Chest pain, unspecified: Secondary | ICD-10-CM | POA: Diagnosis not present

## 2018-05-13 DIAGNOSIS — Z79899 Other long term (current) drug therapy: Secondary | ICD-10-CM | POA: Diagnosis not present

## 2018-05-13 DIAGNOSIS — I2510 Atherosclerotic heart disease of native coronary artery without angina pectoris: Secondary | ICD-10-CM | POA: Insufficient documentation

## 2018-05-13 DIAGNOSIS — I10 Essential (primary) hypertension: Secondary | ICD-10-CM | POA: Diagnosis not present

## 2018-05-13 DIAGNOSIS — Z87891 Personal history of nicotine dependence: Secondary | ICD-10-CM | POA: Insufficient documentation

## 2018-05-13 LAB — I-STAT TROPONIN, ED: TROPONIN I, POC: 0 ng/mL (ref 0.00–0.08)

## 2018-05-13 LAB — CBC
HCT: 44 % (ref 39.0–52.0)
Hemoglobin: 14.3 g/dL (ref 13.0–17.0)
MCH: 31.6 pg (ref 26.0–34.0)
MCHC: 32.5 g/dL (ref 30.0–36.0)
MCV: 97.1 fL (ref 78.0–100.0)
PLATELETS: 237 10*3/uL (ref 150–400)
RBC: 4.53 MIL/uL (ref 4.22–5.81)
RDW: 13.5 % (ref 11.5–15.5)
WBC: 7.4 10*3/uL (ref 4.0–10.5)

## 2018-05-13 LAB — BASIC METABOLIC PANEL
ANION GAP: 9 (ref 5–15)
BUN: 21 mg/dL (ref 8–23)
CALCIUM: 9.8 mg/dL (ref 8.9–10.3)
CO2: 27 mmol/L (ref 22–32)
CREATININE: 1.28 mg/dL — AB (ref 0.61–1.24)
Chloride: 104 mmol/L (ref 98–111)
GFR, EST AFRICAN AMERICAN: 55 mL/min — AB (ref >60)
GFR, EST NON AFRICAN AMERICAN: 48 mL/min — AB (ref >60)
Glucose, Bld: 150 mg/dL — ABNORMAL HIGH (ref 70–99)
Potassium: 3.8 mmol/L (ref 3.5–5.1)
Sodium: 140 mmol/L (ref 135–145)
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
## 2018-05-13 NOTE — ED Notes (Signed)
Patient transported to X-ray 
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
## 2018-05-13 NOTE — ED Notes (Signed)
Pt alert and oriented in NAD. Pt verbalized understanding of discharge instructions. 

## 2018-05-15 ENCOUNTER — Encounter: Payer: Self-pay | Admitting: Internal Medicine

## 2018-05-15 ENCOUNTER — Encounter (INDEPENDENT_AMBULATORY_CARE_PROVIDER_SITE_OTHER): Payer: Self-pay

## 2018-05-15 ENCOUNTER — Ambulatory Visit (INDEPENDENT_AMBULATORY_CARE_PROVIDER_SITE_OTHER): Payer: Medicare Other | Admitting: Internal Medicine

## 2018-05-15 VITALS — BP 134/72 | HR 57 | Ht 67.0 in | Wt 166.0 lb

## 2018-05-15 DIAGNOSIS — I2510 Atherosclerotic heart disease of native coronary artery without angina pectoris: Secondary | ICD-10-CM | POA: Diagnosis not present

## 2018-05-15 DIAGNOSIS — I10 Essential (primary) hypertension: Secondary | ICD-10-CM

## 2018-05-15 DIAGNOSIS — R001 Bradycardia, unspecified: Secondary | ICD-10-CM

## 2018-05-15 MED ORDER — LISINOPRIL-HYDROCHLOROTHIAZIDE 10-12.5 MG PO TABS: 1.0000 | ORAL_TABLET | Freq: Every day | ORAL | 11 refills | Status: DC
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
## 2018-05-15 NOTE — Patient Instructions (Addendum)

## 2018-06-12 DIAGNOSIS — I495 Sick sinus syndrome: Secondary | ICD-10-CM | POA: Diagnosis not present

## 2018-06-12 DIAGNOSIS — I129 Hypertensive chronic kidney disease with stage 1 through stage 4 chronic kidney disease, or unspecified chronic kidney disease: Secondary | ICD-10-CM | POA: Diagnosis not present

## 2018-06-12 DIAGNOSIS — N183 Chronic kidney disease, stage 3 (moderate): Secondary | ICD-10-CM | POA: Diagnosis not present

## 2018-06-12 DIAGNOSIS — I2510 Atherosclerotic heart disease of native coronary artery without angina pectoris: Secondary | ICD-10-CM | POA: Diagnosis not present

## 2018-08-02 DIAGNOSIS — Z23 Encounter for immunization: Secondary | ICD-10-CM | POA: Diagnosis not present

## 2018-08-29 ENCOUNTER — Encounter: Payer: Self-pay | Admitting: Internal Medicine

## 2018-10-14 IMAGING — DX DG CHEST 2V
2 series · 2 of 2 positions shown · non-contrast
Comparison: 09/21/2011

CLINICAL DATA: Left side chest pain

EXAM:
CHEST - 2 VIEW

[chest pa]
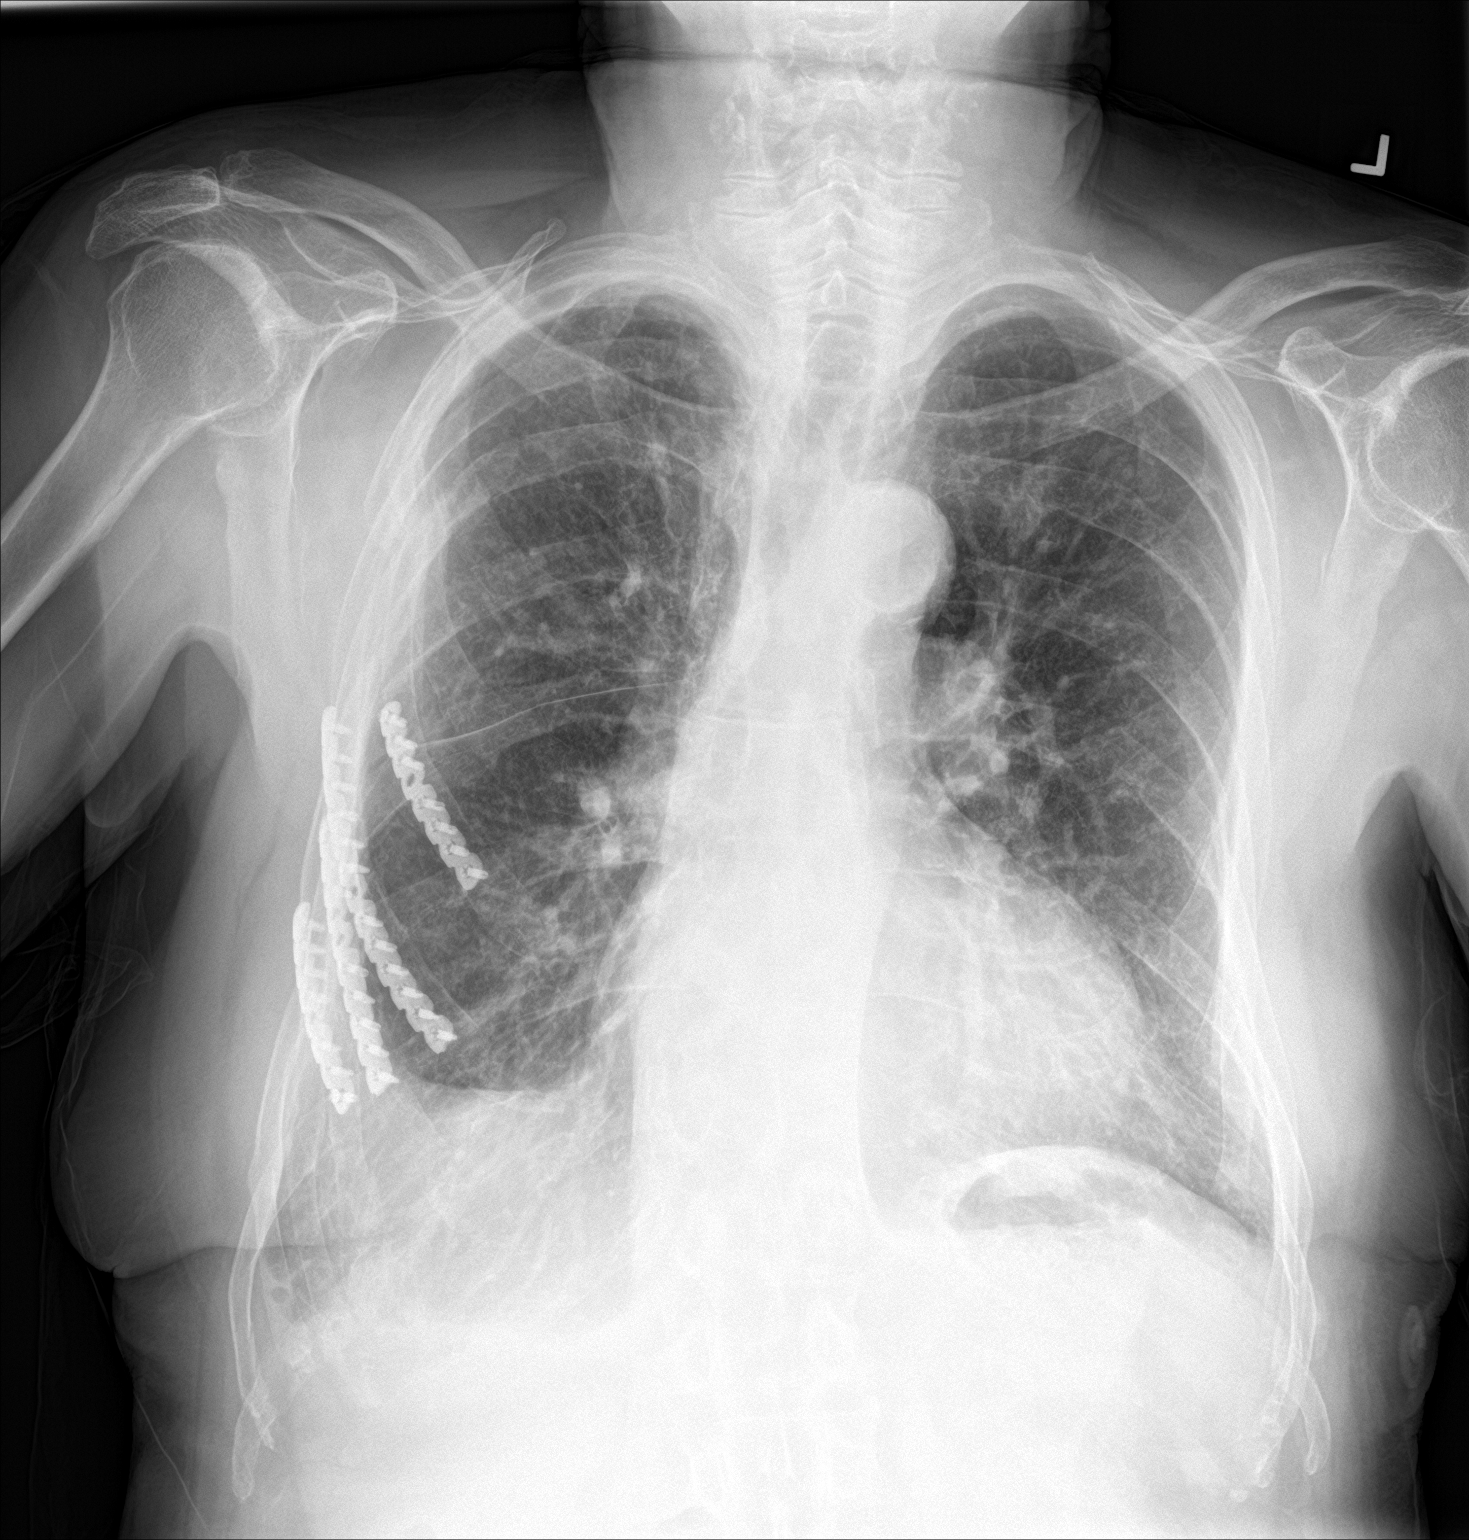

[chest lat]
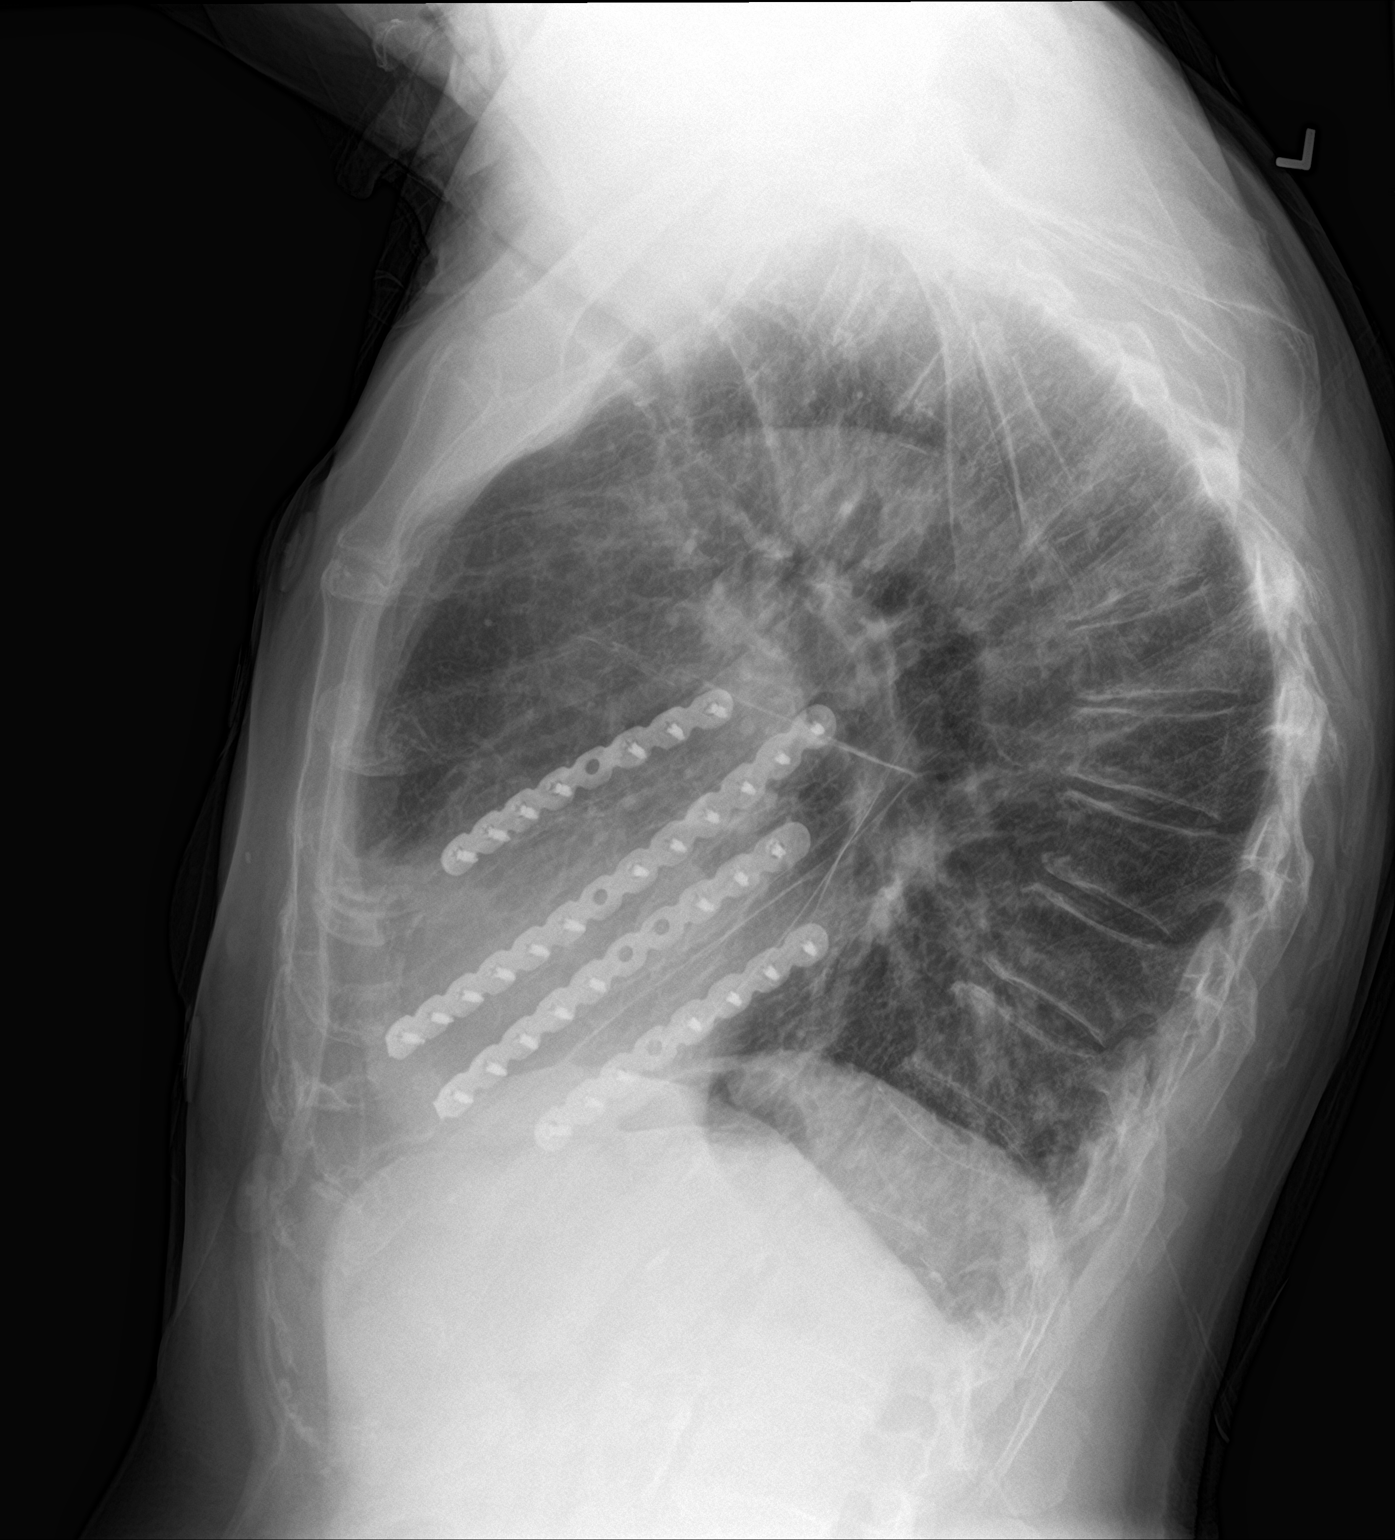

[2 of 2 positions shown; findings below may reference images not displayed]

FINDINGS: Plate and screw fixation devices within multiple right ribs from
prior rib fractures. Heart is borderline in size. No confluent
airspace opacities or effusions. No acute bony abnormality.
IMPRESSION: No active disease.

## 2018-10-15 ENCOUNTER — Encounter: Payer: Self-pay | Admitting: Internal Medicine

## 2018-10-15 ENCOUNTER — Ambulatory Visit: Payer: Medicare Other | Admitting: Internal Medicine

## 2018-10-15 VITALS — BP 132/62 | HR 52 | Ht 67.0 in | Wt 166.0 lb

## 2018-10-15 DIAGNOSIS — I10 Essential (primary) hypertension: Secondary | ICD-10-CM

## 2018-10-15 DIAGNOSIS — I2510 Atherosclerotic heart disease of native coronary artery without angina pectoris: Secondary | ICD-10-CM | POA: Diagnosis not present

## 2018-10-15 DIAGNOSIS — I442 Atrioventricular block, complete: Secondary | ICD-10-CM | POA: Diagnosis not present

## 2018-10-15 NOTE — Patient Instructions (Signed)

## 2018-10-15 NOTE — Progress Notes (Signed)
HPI Mr. Sackrider returns today for followup. He is a pleasant now 84 yo man with a h/o sinus bradycardia and heart block who despite his slow heart rate remains asymptomatic. 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 He remains active working outside and despite being very active, has no limitation in activity except that when he is lifting heavy items, he will get sob. He can walk without any pain but does get dyspneic. He still runs a chain saw.  He lost his wife from CHF recently. Allergies  Allergen Reactions  . Vitamin D Analogs Other (See Comments)    "pins and needles feeling"  . Ambien [Zolpidem Tartrate] Other (See Comments)    Confusion  . Honey Other (See Comments)    "pins and needles feeling"     Current Outpatient Medications  Medication Sig Dispense Refill  . acetaminophen (TYLENOL) 325 MG tablet Take 2 tablets (650 mg total) by mouth every 4 (four) hours as needed for mild pain.    [name] amoxicillin (AMOXIL) 500 MG capsule Take 4 capsules by mouth as needed 1 hour prior to dental procedures.  0  . aspirin EC 81 MG tablet Take 81 mg by mouth daily.    [name] lisinopril-hydrochlorothiazide (PRINZIDE,ZESTORETIC) 10-12.5 MG tablet Take 1 tablet by mouth daily. Please keep appointment 30 tablet 11  . nitroGLYCERIN (NITROSTAT) 0.4 MG SL tablet Place 0.4 mg under the tongue every 5 (five) minutes as needed for chest pain. (MAXIMUM of 3 TABLETS)    . OVER THE COUNTER MEDICATION Take 2 capsules by mouth daily. OTC [place] capsules    . OVER THE COUNTER MEDICATION Take 1 capsule by mouth daily. OMEGA Q PLUS    . Red Yeast Rice 600 MG CAPS Take 1 capsule by mouth daily.     . temazepam (RESTORIL) 30 MG capsule Take 30 mg by mouth at bedtime.     . Throat Lozenges (FISHERMANS FRIEND MT) Use as directed 1 lozenge in the mouth or throat 2 (two) times daily as needed (congestion).     No current facility-administered medications for this visit.      Past Medical History:  Diagnosis  Date  . Blastocystis hominis   . CAD (coronary artery disease)    S/P inferior MI 07/2000 with RCA stenting at that time. In 03/2006 he had a LAD non-drug-eluting stent implanted.  . Chronic insomnia   . Complex renal cyst    Left  . Diverticulitis   . ED (erectile dysfunction)    Vacuum pump  . Essential hypertension, benign    Stable, on lisinopril-HCTZ & Atenolol.  [name] GERD (gastroesophageal reflux disease)   . H/O pericarditis   . Hemorrhoids   . LBBB (left bundle branch block)   . Multiple rib fractures 09/25/10   PTX 09/2010  . Nephrolithiasis    Renal stone basketing.  . Pure hypercholesterolemia    On Red Yeast Rice & CoQ 10.  . Sinoatrial node dysfunction (HCC)    EC Holter 24hr 06/24/13 showed sinus bradycardia with probable chronotropic incompetence. May need pacemaker per Dr. Daneen Schick.  . Skin cancer    Left base (Face) & (Ear in 12/2011).    ROS:   All systems reviewed and negative except as noted in the HPI.   Past Surgical History:  Procedure Laterality Date  . CORONARY ANGIOPLASTY WITH STENT PLACEMENT  07/25/00   MI in 07/25/00 with RCA stenting at that time.  . CORONARY ANGIOPLASTY WITH STENT  PLACEMENT  04/14/06   Bare metal stent in proximal LAD [facility] - 3.5 x16 stent) by Dr. Linard Millers.  [name] PROSTATE BIOPSY     Evidence  . RIGHT THORACOTOMY,OPEN REDUCTION AND INTERNAL FIXATION OF RIBS 4-7  09/25/2010  . STONE EXTRACTION WITH BASKET     Renal     Family History  Problem Relation Age of Onset  . CVA Mother   . Heart attack Father   . Kidney failure Father   . Heart disease Sister        Possible heart disease  . Aneurysm Sister   . Hypertension Brother   . Heart disease Brother   . Heart failure Brother   . Lung disease Brother   . Diabetes Brother      Social History   Socioeconomic History  . Marital status: Single    Spouse name: Not on file  . Number of children: Not on file  . Years of education: Not on file  .  Highest education level: Not on file  Occupational History  . Occupation: Engineer, technical sales: RETIRED  Social Needs  . Financial resource strain: Not on file  . Food insecurity:    Worry: Not on file    Inability: Not on file  . Transportation needs:    Medical: Not on file    Non-medical: Not on file  Tobacco Use  . Smoking status: Former Smoker    Types: Cigars  . Smokeless tobacco: Never Used  . Tobacco comment: quit 1990  Substance and Sexual Activity  . Alcohol use: Yes    Comment: NOT ON A REGULAR BASIS  . Drug use: No  . Sexual activity: Not on file  Lifestyle  . Physical activity:    Days per week: Not on file    Minutes per session: Not on file  . Stress: Not on file  Relationships  . Social connections:    Talks on phone: Not on file    Gets together: Not on file    Attends religious service: Not on file    Active member of club or organization: Not on file    Attends meetings of clubs or organizations: Not on file    Relationship status: Not on file  . Intimate partner violence:    Fear of current or ex partner: Not on file    Emotionally abused: Not on file    Physically abused: Not on file    Forced sexual activity: Not on file  Other Topics Concern  . Not on file  Social History Narrative  . Not on file     BP 132/62   Pulse (!) 52   Ht 5\' 7"  (1.702 m)   Wt 166 lb (75.3 kg)   BMI 26.00 kg/m   Physical Exam:  Well appearing elderly man, NAD HEENT: Unremarkable Neck:  No JVD, no thyromegally Lymphatics:  No adenopathy Back:  No CVA tenderness Lungs:  Clear with no wheezes HEART:  Regular rate rhythm, no murmurs, no rubs, no clicks Abd:  soft, positive bowel sounds, no organomegally, no rebound, no guarding Ext:  2 plus pulses, no edema, no cyanosis, no clubbing Skin:  No rashes no nodules Neuro:  CN II through XII intact, motor grossly intact  EKG - nsr with AVWB and junctional escape   Assess/Plan: 1. High grade heart block - he  has significant conduction system disease but remains asymptomatic. I discussed the warning signs for him and he  will undergo watchful waiting. 2. HTN - his pressure is well controlled. He will continue his current meds. 3. CAD - he has not had much angina since I saw him last.  4. Diastolic heart failure - he has class 2 symptoms. He is encouraged to remain active and to stop and rest when he feels sob.  Mikle Bosworth.D.

## 2018-10-25 ENCOUNTER — Encounter: Payer: Self-pay | Admitting: Interventional Cardiology

## 2018-10-25 ENCOUNTER — Ambulatory Visit: Payer: Medicare Other | Admitting: Interventional Cardiology

## 2018-10-25 VITALS — BP 142/76 | HR 67 | Ht 67.5 in | Wt 165.0 lb

## 2018-10-25 DIAGNOSIS — I10 Essential (primary) hypertension: Secondary | ICD-10-CM

## 2018-10-25 DIAGNOSIS — R001 Bradycardia, unspecified: Secondary | ICD-10-CM | POA: Diagnosis not present

## 2018-10-25 DIAGNOSIS — E7800 Pure hypercholesterolemia, unspecified: Secondary | ICD-10-CM | POA: Diagnosis not present

## 2018-10-25 DIAGNOSIS — I25118 Atherosclerotic heart disease of native coronary artery with other forms of angina pectoris: Secondary | ICD-10-CM | POA: Diagnosis not present

## 2018-10-25 NOTE — Patient Instructions (Signed)
Medication Instructions:  Your physician recommends that you continue on your current medications as directed. Please refer to the Current Medication list given to you today.  If you need a refill on your cardiac medications before your next appointment, please call your pharmacy.   Lab work: None If you have labs (blood work) drawn today and your tests are completely normal, you will receive your results only by: . MyChart Message (if you have MyChart) OR . A paper copy in the mail If you have any lab test that is abnormal or we need to change your treatment, we will call you to review the results.  Testing/Procedures: None  Follow-Up: At [facility], you and your health needs are our priority.  As part of our continuing mission to provide you with exceptional heart care, we have created designated Provider Care Teams.  These Care Teams include your primary Cardiologist (physician) and Advanced Practice Providers (APPs -  Physician Assistants and Nurse Practitioners) who all work together to provide you with the care you need, when you need it. You will need a follow up appointment in 6 months.  Please call our office 2 months in advance to schedule this appointment.  You may see Dr. Peraza or one of the following Advanced Practice Providers on your designated Care Team:   Lori Gerhardt, NP Laura Ingold, NP . Jill McDaniel, NP  Any Other Special Instructions Will Be Listed Below (If Applicable).    

## 2018-10-25 NOTE — Progress Notes (Signed)
Cardiology Office Note:    Date:  10/25/2018   ID:  Brandon Robles, DOB July 15, 1926, MRN [id number]  PCP:  Lavone Orn, MD  Cardiologist:  No primary care provider on file.   Referring MD: Lavone Orn, MD   Chief Complaint  Patient presents with  . Coronary Artery Disease  . Advice Only    Bradycardia    History of Present Illness:    Brandon Robles is a 85 y.o. male with a hx of coronary artery disease with prior non-drug-eluting stent implantation during acute inferior in 2001and LAD bare-metal stent 2007, hypertension, hyperlipidemia, sinus node dysfunction, complete heart block, accelerated junctional rhythm and history of pericarditis.  He is doing well.  He has not had syncope.  He saw Dr. Osie Cheeks recently.  No indication for pacemaker therapy at that time.  Denies chest pain other than musculoskeletal discomfort that he has had since having the crush injury several years ago.  He still does physical activity around his house and on his property up to 5 hours/day with ample rest.  He gets significantly short of breath if he walks up to 1/2 mile.  He does this from time to time.  There is no associated chest discomfort.  No blood in the urine or stool.  No medication side effects.  Medications are saw the heart rate of been discontinued.  Past Medical History:  Diagnosis Date  . Blastocystis hominis   . CAD (coronary artery disease)    S/P inferior MI 07/2000 with RCA stenting at that time. In 03/2006 he had a LAD non-drug-eluting stent implanted.  . Chronic insomnia   . Complex renal cyst    Left  . Diverticulitis   . ED (erectile dysfunction)    Vacuum pump  . Essential hypertension, benign    Stable, on lisinopril-HCTZ & Atenolol.  [name] GERD (gastroesophageal reflux disease)   . H/O pericarditis   . Hemorrhoids   . LBBB (left bundle branch block)   . Multiple rib fractures 09/25/10   PTX 09/2010  . Nephrolithiasis    Renal stone basketing.  . Pure  hypercholesterolemia    On Red Yeast Rice & CoQ 10.  . Sinoatrial node dysfunction (HCC)    EC Holter 24hr 06/24/13 showed sinus bradycardia with probable chronotropic incompetence. May need pacemaker per Dr. Daneen Schick.  . Skin cancer    Left base (Face) & (Ear in 12/2011).    Past Surgical History:  Procedure Laterality Date  . CORONARY ANGIOPLASTY WITH STENT PLACEMENT  07/25/00   MI in 07/25/00 with RCA stenting at that time.  . CORONARY ANGIOPLASTY WITH STENT PLACEMENT  04/14/06   Bare metal stent in proximal LAD [facility] [employer] - 3.5 x16 stent) by Dr. Linard Millers.  [name] PROSTATE BIOPSY     Evidence  . RIGHT THORACOTOMY,OPEN REDUCTION AND INTERNAL FIXATION OF RIBS 4-7  09/25/2010  . STONE EXTRACTION WITH BASKET     Renal    Current Medications: Current Meds  Medication Sig  . acetaminophen (TYLENOL) 325 MG tablet Take 2 tablets (650 mg total) by mouth every 4 (four) hours as needed for mild pain.  [name] amoxicillin (AMOXIL) 500 MG capsule Take 4 capsules by mouth as needed 1 hour prior to dental procedures.  [name] aspirin EC 81 MG tablet Take 81 mg by mouth daily.  [name] lisinopril-hydrochlorothiazide (PRINZIDE,ZESTORETIC) 10-12.5 MG tablet Take 1 tablet by mouth daily. Please keep appointment  . nitroGLYCERIN (NITROSTAT) 0.4 MG SL tablet Place 0.4  mg under the tongue every 5 (five) minutes as needed for chest pain. (MAXIMUM of 3 TABLETS)  . OVER THE COUNTER MEDICATION Take 2 capsules by mouth daily. OTC [place] capsules  . OVER THE COUNTER MEDICATION Take 1 capsule by mouth daily. OMEGA Q PLUS  . Red Yeast Rice 600 MG CAPS Take 1 capsule by mouth daily.   . temazepam (RESTORIL) 30 MG capsule Take 30 mg by mouth at bedtime.   . Throat Lozenges (FISHERMANS FRIEND MT) Use as directed 1 lozenge in the mouth or throat 2 (two) times daily as needed (congestion).     Allergies:   Vitamin d analogs; Ambien [zolpidem tartrate]; and Honey   Social History   Socioeconomic History  . Marital  status: Single    Spouse name: Not on file  . Number of children: Not on file  . Years of education: Not on file  . Highest education level: Not on file  Occupational History  . Occupation: Engineer, technical sales: RETIRED  Social Needs  . Financial resource strain: Not on file  . Food insecurity:    Worry: Not on file    Inability: Not on file  . Transportation needs:    Medical: Not on file    Non-medical: Not on file  Tobacco Use  . Smoking status: Former Smoker    Types: Cigars  . Smokeless tobacco: Never Used  . Tobacco comment: quit 1990  Substance and Sexual Activity  . Alcohol use: Yes    Comment: NOT ON A REGULAR BASIS  . Drug use: No  . Sexual activity: Not on file  Lifestyle  . Physical activity:    Days per week: Not on file    Minutes per session: Not on file  . Stress: Not on file  Relationships  . Social connections:    Talks on phone: Not on file    Gets together: Not on file    Attends religious service: Not on file    Active member of club or organization: Not on file    Attends meetings of clubs or organizations: Not on file    Relationship status: Not on file  Other Topics Concern  . Not on file  Social History Narrative  . Not on file     Family History: The patient's family history includes Aneurysm in his sister; CVA in his mother; Diabetes in his brother; Heart attack in his father; Heart disease in his brother and sister; Heart failure in his brother; Hypertension in his brother; Kidney failure in his father; Lung disease in his brother.  ROS:   Please see the history of present illness.    Musculoskeletal chest discomfort.  His wife passed away in 08/21/2023.  He has been depressed but seems to be doing relatively well.  All other systems reviewed and are negative.  EKGs/Labs/Other Studies Reviewed:    The following studies were reviewed today: No new cardiac functional or imaging data is available.  EKG:  EKG is not performed today.  The  tracing done on 10/15/2018 reveals AV block with escape rhythm at 52 bpm.  This was done at the EP office visit.  Recent Labs: 05/13/2018: BUN 21; Creatinine, Ser 1.28; Hemoglobin 14.3; Platelets 237; Potassium 3.8; Sodium 140  Recent Lipid Panel    Component Value Date/Time   CHOL 140 06/30/2014 1025   TRIG 48.0 06/30/2014 1025   HDL 45.80 06/30/2014 1025   CHOLHDL 3 06/30/2014 1025   VLDL 9.6 06/30/2014 1025  Jette 85 06/30/2014 1025    Physical Exam:    VS:  BP (!) 142/76   Pulse 67   Ht 5' 7.5" (1.715 m)   Wt 165 lb (74.8 kg)   SpO2 99%   BMI 25.46 kg/m     Wt Readings from Last 3 Encounters:  10/25/18 165 lb (74.8 kg)  10/15/18 166 lb (75.3 kg)  05/15/18 166 lb (75.3 kg)     GEN: Appears younger than his stated age.. No acute distress HEENT: Normal NECK: No JVD. LYMPHATICS: No lymphadenopathy CARDIAC: RRR.  No murmur, gallop, edema VASCULAR: Pulses 2+ bilateral radial, Bruits are absent in the neck. RESPIRATORY:  Clear to auscultation without rales, wheezing or rhonchi  ABDOMEN: Soft, non-tender, non-distended, No pulsatile mass, MUSCULOSKELETAL: No deformity  SKIN: Warm and dry NEUROLOGIC:  Alert and oriented x 3 PSYCHIATRIC:  Normal affect   ASSESSMENT:    1. Coronary artery disease of native artery of native heart with stable angina pectoris ([place])   2. Pure hypercholesterolemia   3. Hypertension, unspecified type   4. Bradycardia    PLAN:    In order of problems listed above:  1. Stable without angina pectoris.  Secondary risk prevention is reviewed although he has done fairly well to make it to age 33 and still be as active as he is.  I encouraged continued moderate aerobic activity. 2. Most recent lipid profile done in August 2019 demonstrates an LDL of 64.  No change in therapy is needed. 3. Target blood pressure 140/90 mmHg or less given his age of 65.  Low-salt diet is encouraged.  He eats out every night. 4. AV block is being followed by EP.   No indication for pacemaker at this point according to Dr. Lovena Le.  Continue active lifestyle.  Clinical follow-up with me again in 6 months.  I will try to see the patient in the summertime Dr. Lovena Le will be seeing him in the wintertime.   Medication Adjustments/Labs and Tests Ordered: Current medicines are reviewed at length with the patient today.  Concerns regarding medicines are outlined above.  No orders of the defined types were placed in this encounter.  No orders of the defined types were placed in this encounter.   Patient Instructions  Medication Instructions:  Your physician recommends that you continue on your current medications as directed. Please refer to the Current Medication list given to you today.  If you need a refill on your cardiac medications before your next appointment, please call your pharmacy.   Lab work: None If you have labs (blood work) drawn today and your tests are completely normal, you will receive your results only by: [name] MyChart Message (if you have MyChart) OR . A paper copy in the mail If you have any lab test that is abnormal or we need to change your treatment, we will call you to review the results.  Testing/Procedures: None  Follow-Up: At [facility], you and your health needs are our priority.  As part of our continuing mission to provide you with exceptional heart care, we have created designated Provider Care Teams.  These Care Teams include your primary Cardiologist (physician) and Advanced Practice Providers (APPs -  Physician Assistants and Nurse Practitioners) who all work together to provide you with the care you need, when you need it. You will need a follow up appointment in 6 months.  Please call our office 2 months in advance to schedule this appointment.  You may see Dr.  Tamala Julian or one of the following Electrical engineer on your designated Care Team:   Truitt Merle, NP Cecilie Kicks, NP . Kathyrn Drown, NP  Any  Other Special Instructions Will Be Listed Below (If Applicable).       Signed, Sinclair Grooms, MD  10/25/2018 1:33 PM    [facility]

## 2018-11-30 DIAGNOSIS — C44321 Squamous cell carcinoma of skin of nose: Secondary | ICD-10-CM | POA: Diagnosis not present

## 2018-11-30 DIAGNOSIS — Z85828 Personal history of other malignant neoplasm of skin: Secondary | ICD-10-CM | POA: Diagnosis not present

## 2018-11-30 DIAGNOSIS — L570 Actinic keratosis: Secondary | ICD-10-CM | POA: Diagnosis not present

## 2018-12-05 DIAGNOSIS — R05 Cough: Secondary | ICD-10-CM | POA: Diagnosis not present

## 2018-12-05 DIAGNOSIS — J209 Acute bronchitis, unspecified: Secondary | ICD-10-CM | POA: Diagnosis not present

## 2019-04-08 DIAGNOSIS — L821 Other seborrheic keratosis: Secondary | ICD-10-CM | POA: Diagnosis not present

## 2019-04-08 DIAGNOSIS — Z85828 Personal history of other malignant neoplasm of skin: Secondary | ICD-10-CM | POA: Diagnosis not present

## 2019-04-08 DIAGNOSIS — D225 Melanocytic nevi of trunk: Secondary | ICD-10-CM | POA: Diagnosis not present

## 2019-04-08 DIAGNOSIS — L578 Other skin changes due to chronic exposure to nonionizing radiation: Secondary | ICD-10-CM | POA: Diagnosis not present

## 2019-04-17 DIAGNOSIS — H353131 Nonexudative age-related macular degeneration, bilateral, early dry stage: Secondary | ICD-10-CM | POA: Diagnosis not present

## 2019-04-17 DIAGNOSIS — D3132 Benign neoplasm of left choroid: Secondary | ICD-10-CM | POA: Diagnosis not present

## 2019-04-29 ENCOUNTER — Other Ambulatory Visit: Payer: Self-pay | Admitting: Internal Medicine

## 2019-06-18 ENCOUNTER — Encounter: Payer: Self-pay | Admitting: Interventional Cardiology

## 2019-06-18 DIAGNOSIS — N183 Chronic kidney disease, stage 3 (moderate): Secondary | ICD-10-CM | POA: Diagnosis not present

## 2019-06-18 DIAGNOSIS — I2510 Atherosclerotic heart disease of native coronary artery without angina pectoris: Secondary | ICD-10-CM | POA: Diagnosis not present

## 2019-06-18 DIAGNOSIS — E7800 Pure hypercholesterolemia, unspecified: Secondary | ICD-10-CM | POA: Diagnosis not present

## 2019-06-18 DIAGNOSIS — I129 Hypertensive chronic kidney disease with stage 1 through stage 4 chronic kidney disease, or unspecified chronic kidney disease: Secondary | ICD-10-CM | POA: Diagnosis not present

## 2019-07-12 ENCOUNTER — Other Ambulatory Visit: Payer: Self-pay | Admitting: Interventional Cardiology

## 2019-07-14 NOTE — Progress Notes (Signed)
Cardiology Office Note:    Date:  07/15/2019   ID:  Brandon Robles, DOB Oct 02, 1926, MRN [id number]  PCP:  Lavone Orn, MD  Cardiologist:  Sinclair Grooms, MD   Referring MD: Lavone Orn, MD   Chief Complaint  Patient presents with  . Coronary Artery Disease  . Advice Only    AV block    History of Present Illness:    Brandon Robles is a 85 y.o. male with a hx of coronary artery disease with prior non-drug-eluting stent implantation during acute inferior in 2001and LAD bare-metal stent 2007, hypertension, hyperlipidemia, sinus node dysfunction, complete heart block, accelerated junctional rhythm and history of pericarditis.  Linna Hoff continues to do well.  He lives independently.  His daughter prepares supper every night.  His other meals are kcal from fast food places.  He has not had syncope.  He denies chest pain, orthopnea, ankle swelling.  No palpitations.  No episodes of syncope or near syncope.   Past Medical History:  Diagnosis Date  . Blastocystis hominis   . CAD (coronary artery disease)    S/P inferior MI 07/2000 with RCA stenting at that time. In 03/2006 he had a LAD non-drug-eluting stent implanted.  . Chronic insomnia   . Complex renal cyst    Left  . Diverticulitis   . ED (erectile dysfunction)    Vacuum pump  . Essential hypertension, benign    Stable, on lisinopril-HCTZ & Atenolol.  [name] GERD (gastroesophageal reflux disease)   . H/O pericarditis   . Hemorrhoids   . LBBB (left bundle branch block)   . Multiple rib fractures 09/25/10   PTX 09/2010  . Nephrolithiasis    Renal stone basketing.  . Pure hypercholesterolemia    On Red Yeast Rice & CoQ 10.  . Sinoatrial node dysfunction (HCC)    EC Holter 24hr 06/24/13 showed sinus bradycardia with probable chronotropic incompetence. May need pacemaker per Dr. Daneen Schick.  . Skin cancer    Left base (Face) & (Ear in 12/2011).    Past Surgical History:  Procedure Laterality Date  . CORONARY ANGIOPLASTY WITH  STENT PLACEMENT  07/25/00   MI in 07/25/00 with RCA stenting at that time.  . CORONARY ANGIOPLASTY WITH STENT PLACEMENT  04/14/06   Bare metal stent in proximal LAD [facility] [employer] - 3.5 x16 stent) by Dr. Linard Millers.  [name] PROSTATE BIOPSY     Evidence  . RIGHT THORACOTOMY,OPEN REDUCTION AND INTERNAL FIXATION OF RIBS 4-7  09/25/2010  . STONE EXTRACTION WITH BASKET     Renal    Current Medications: Current Meds  Medication Sig  . acetaminophen (TYLENOL) 325 MG tablet Take 2 tablets (650 mg total) by mouth every 4 (four) hours as needed for mild pain.  [name] amoxicillin (AMOXIL) 500 MG capsule Take 4 capsules by mouth as needed 1 hour prior to dental procedures.  [name] aspirin EC 81 MG tablet Take 81 mg by mouth daily.  [name] lisinopril-hydrochlorothiazide (ZESTORETIC) 10-12.5 MG tablet TAKE ONE TABLET BY MOUTH DAILY  . nitroGLYCERIN (NITROSTAT) 0.4 MG SL tablet Place 0.4 mg under the tongue every 5 (five) minutes as needed for chest pain. (MAXIMUM of 3 TABLETS)  . OVER THE COUNTER MEDICATION Take 2 capsules by mouth daily. OTC [place] capsules  . OVER THE COUNTER MEDICATION Take 1 capsule by mouth daily. OMEGA Q PLUS  . Red Yeast Rice 600 MG CAPS Take 1 capsule by mouth daily.   . temazepam (RESTORIL) 30 MG capsule  Take 30 mg by mouth at bedtime.   . Throat Lozenges (FISHERMANS FRIEND MT) Use as directed 1 lozenge in the mouth or throat 2 (two) times daily as needed (congestion).     Allergies:   Vitamin d analogs, Ambien [zolpidem tartrate], and Honey   Social History   Socioeconomic History  . Marital status: Single    Spouse name: Not on file  . Number of children: Not on file  . Years of education: Not on file  . Highest education level: Not on file  Occupational History  . Occupation: Engineer, technical sales: RETIRED  Social Needs  . Financial resource strain: Not on file  . Food insecurity    Worry: Not on file    Inability: Not on file  . Transportation needs    Medical: Not on  file    Non-medical: Not on file  Tobacco Use  . Smoking status: Former Smoker    Types: Cigars  . Smokeless tobacco: Never Used  . Tobacco comment: quit 1990  Substance and Sexual Activity  . Alcohol use: Yes    Comment: NOT ON A REGULAR BASIS  . Drug use: No  . Sexual activity: Not on file  Lifestyle  . Physical activity    Days per week: Not on file    Minutes per session: Not on file  . Stress: Not on file  Relationships  . Social Herbalist on phone: Not on file    Gets together: Not on file    Attends religious service: Not on file    Active member of club or organization: Not on file    Attends meetings of clubs or organizations: Not on file    Relationship status: Not on file  Other Topics Concern  . Not on file  Social History Narrative  . Not on file     Family History: The patient's family history includes Aneurysm in his sister; CVA in his mother; Diabetes in his brother; Heart attack in his father; Heart disease in his brother and sister; Heart failure in his brother; Hypertension in his brother; Kidney failure in his father; Lung disease in his brother.  ROS:   Please see the history of present illness.    I cautioned against climbing ladders and trying to clean out his own gutters.  All other systems reviewed and are negative.  EKGs/Labs/Other Studies Reviewed:    The following studies were reviewed today: No new data  EKG:  EKG EKG is read by the computer algorithm as atrial fibrillation.  Close inspection demonstrates clear P waves.  There are intermittent episodes of AV block.  This is similar to prior tracings.  Left axis deviation.  When compared to the prior tracing performed in December 2019, no change has occurred.  Recent Labs: No results found for requested labs within last 8760 hours.  Recent Lipid Panel    Component Value Date/Time   CHOL 140 06/30/2014 1025   TRIG 48.0 06/30/2014 1025   HDL 45.80 06/30/2014 1025   CHOLHDL 3  06/30/2014 1025   VLDL 9.6 06/30/2014 1025   LDLCALC 85 06/30/2014 1025    Physical Exam:    VS:  BP (!) 142/74   Pulse 60   Ht 5' 7.5" (1.715 m)   Wt 168 lb (76.2 kg)   SpO2 97%   BMI 25.92 kg/m     Wt Readings from Last 3 Encounters:  07/15/19 168 lb (76.2 kg)  10/25/18 165  lb (74.8 kg)  10/15/18 166 lb (75.3 kg)     GEN: Elderly but stable.. No acute distress HEENT: Normal NECK: No JVD. LYMPHATICS: No lymphadenopathy CARDIAC: Irregular RR without murmur, gallop, or edema. VASCULAR:  Normal Pulses. No bruits. RESPIRATORY:  Clear to auscultation without rales, wheezing or rhonchi  ABDOMEN: Soft, non-tender, non-distended, No pulsatile mass, MUSCULOSKELETAL: No deformity  SKIN: Warm and dry NEUROLOGIC:  Alert and oriented x 3 PSYCHIATRIC:  Normal affect   ASSESSMENT:    1. Coronary artery disease of native artery of native heart with stable angina pectoris ([place])   2. Hypertension, unspecified type   3. Pure hypercholesterolemia   4. Third degree heart block ([place])   5. Hx of traumatic subdural hematoma   6. Educated About Covid-19 Virus Infection    PLAN:    In order of problems listed above:  1. No anginal complaints.  Secondary prevention discussed generally. 2. Blood pressure target 130/80 mmHg and at his age probably 140/80 is the best. 3. No significant issues with lipids on current therapy. 4. Third-degree heart block has been noted on monitoring.  Watchful waiting was recommended by Dr. Lovena Le.  This is continuing as the management strategy.  He has not had syncope or other issues related to bradycardia. 5. Not discussed 6. Social distancing, handwashing, and and mask wearing.  He needs to return in 6 months with an EKG.  Evaluation with EKG in 6 months.  He should be seen every 6 months.  Cautioned against risky physical activity such as climbing ladders due to age and conduction system disease that may cause unexpected loss of consciousness.    Medication Adjustments/Labs and Tests Ordered: Current medicines are reviewed at length with the patient today.  Concerns regarding medicines are outlined above.  Orders Placed This Encounter  Procedures  . EKG 12-Lead   No orders of the defined types were placed in this encounter.   There are no Patient Instructions on file for this visit.   Signed, Sinclair Grooms, MD  07/15/2019 2:29 PM    [place]

## 2019-07-15 ENCOUNTER — Encounter: Payer: Self-pay | Admitting: Interventional Cardiology

## 2019-07-15 ENCOUNTER — Ambulatory Visit: Payer: Medicare Other | Admitting: Interventional Cardiology

## 2019-07-15 ENCOUNTER — Other Ambulatory Visit: Payer: Self-pay

## 2019-07-15 VITALS — BP 142/74 | HR 60 | Ht 67.5 in | Wt 168.0 lb

## 2019-07-15 DIAGNOSIS — I25118 Atherosclerotic heart disease of native coronary artery with other forms of angina pectoris: Secondary | ICD-10-CM

## 2019-07-15 DIAGNOSIS — I442 Atrioventricular block, complete: Secondary | ICD-10-CM | POA: Diagnosis not present

## 2019-07-15 DIAGNOSIS — Z87828 Personal history of other (healed) physical injury and trauma: Secondary | ICD-10-CM

## 2019-07-15 DIAGNOSIS — Z7189 Other specified counseling: Secondary | ICD-10-CM

## 2019-07-15 DIAGNOSIS — I10 Essential (primary) hypertension: Secondary | ICD-10-CM | POA: Diagnosis not present

## 2019-07-15 DIAGNOSIS — E7800 Pure hypercholesterolemia, unspecified: Secondary | ICD-10-CM | POA: Diagnosis not present

## 2019-07-15 NOTE — Patient Instructions (Signed)
Medication Instructions:  Your physician recommends that you continue on your current medications as directed. Please refer to the Current Medication list given to you today.  If you need a refill on your cardiac medications before your next appointment, please call your pharmacy.   Lab work: None If you have labs (blood work) drawn today and your tests are completely normal, you will receive your results only by: [name] MyChart Message (if you have MyChart) OR . A paper copy in the mail If you have any lab test that is abnormal or we need to change your treatment, we will call you to review the results.  Testing/Procedures: None  Follow-Up:  Your physician wants you to follow-up in: 6 months with a PA or NP. You will receive a reminder letter in the mail two months in advance. If you don't receive a letter, please call our office to schedule the follow-up appointment.  At [facility], you and your health needs are our priority.  As part of our continuing mission to provide you with exceptional heart care, we have created designated Provider Care Teams.  These Care Teams include your primary Cardiologist (physician) and Advanced Practice Providers (APPs -  Physician Assistants and Nurse Practitioners) who all work together to provide you with the care you need, when you need it. You will need a follow up appointment in 12 months.  Please call our office 2 months in advance to schedule this appointment.  You may see Sinclair Grooms, MD or one of the following Advanced Practice Providers on your designated Care Team:   Truitt Merle, NP Cecilie Kicks, NP . Kathyrn Drown, NP  Any Other Special Instructions Will Be Listed Below (If Applicable).

## 2019-07-26 ENCOUNTER — Other Ambulatory Visit: Payer: Self-pay | Admitting: Internal Medicine

## 2019-10-14 ENCOUNTER — Encounter: Payer: Self-pay | Admitting: *Deleted

## 2019-10-24 ENCOUNTER — Encounter: Payer: Self-pay | Admitting: Internal Medicine

## 2019-10-24 ENCOUNTER — Encounter (INDEPENDENT_AMBULATORY_CARE_PROVIDER_SITE_OTHER): Payer: Self-pay

## 2019-10-24 ENCOUNTER — Other Ambulatory Visit: Payer: Self-pay

## 2019-10-24 ENCOUNTER — Ambulatory Visit: Payer: Medicare Other | Admitting: Internal Medicine

## 2019-10-24 VITALS — BP 178/82 | HR 56 | Ht 67.5 in | Wt 167.8 lb

## 2019-10-24 DIAGNOSIS — I442 Atrioventricular block, complete: Secondary | ICD-10-CM | POA: Diagnosis not present

## 2019-10-24 DIAGNOSIS — I25119 Atherosclerotic heart disease of native coronary artery with unspecified angina pectoris: Secondary | ICD-10-CM | POA: Diagnosis not present

## 2019-10-24 DIAGNOSIS — I10 Essential (primary) hypertension: Secondary | ICD-10-CM

## 2019-10-24 MED ORDER — LISINOPRIL-HYDROCHLOROTHIAZIDE 10-12.5 MG PO TABS: 1.0000 | ORAL_TABLET | Freq: Every day | ORAL | 3 refills | Status: DC

## 2019-10-24 NOTE — Patient Instructions (Signed)

## 2019-10-24 NOTE — Progress Notes (Signed)
HPI [name] returns today for followup of his PM. He is a pleasant elderly woman with a h/o HTN, high grade heart block, and sinus bradycardia. 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 He remains active. He has not had syncope. He denies anginal symptoms or sob.  Allergies  Allergen Reactions  . Vitamin D Analogs Other (See Comments)    "pins and needles feeling"  . Ambien [Zolpidem Tartrate] Other (See Comments)    Confusion  . Honey Other (See Comments)    "pins and needles feeling"     Current Outpatient Medications  Medication Sig Dispense Refill  . acetaminophen (TYLENOL) 325 MG tablet Take 2 tablets (650 mg total) by mouth every 4 (four) hours as needed for mild pain.    [name] amoxicillin (AMOXIL) 500 MG capsule Take 4 capsules by mouth as needed 1 hour prior to dental procedures.  0  . aspirin EC 81 MG tablet Take 81 mg by mouth daily.    [name] lisinopril-hydrochlorothiazide (ZESTORETIC) 10-12.5 MG tablet Take 1 tablet by mouth daily. Please make yearly appt with Dr. Lovena Le for December before anymore refills. 1st attempt 30 tablet 2  . nitroGLYCERIN (NITROSTAT) 0.4 MG SL tablet Place 0.4 mg under the tongue every 5 (five) minutes as needed for chest pain. (MAXIMUM of 3 TABLETS)    . OVER THE COUNTER MEDICATION Take 2 capsules by mouth daily. OTC [place] capsules    . OVER THE COUNTER MEDICATION Take 1 capsule by mouth daily. OMEGA Q PLUS    . Red Yeast Rice 600 MG CAPS Take 1 capsule by mouth daily.     . temazepam (RESTORIL) 30 MG capsule Take 30 mg by mouth at bedtime.     . Throat Lozenges (FISHERMANS FRIEND MT) Use as directed 1 lozenge in the mouth or throat 2 (two) times daily as needed (congestion).     No current facility-administered medications for this visit.     Past Medical History:  Diagnosis Date  . Blastocystis hominis   . Bradycardia 07/18/2013  . CAD (coronary artery disease)    S/P inferior MI 07/2000 with RCA stenting at that time. In  03/2006 he had a LAD non-drug-eluting stent implanted.  . Chronic insomnia   . Complex renal cyst    Left  . Diverticulitis   . ED (erectile dysfunction)    Vacuum pump  . Essential hypertension, benign    Stable, on lisinopril-HCTZ & Atenolol.  [name] GERD (gastroesophageal reflux disease)   . H/O pericarditis   . Hemorrhoids   . LBBB (left bundle branch block)   . Multiple facial fractures ([place]) 02/25/2016  . Multiple rib fractures 09/25/10   PTX 09/2010  . Nephrolithiasis    Renal stone basketing.  . Pure hypercholesterolemia    On Red Yeast Rice & CoQ 10.  . Sinoatrial node dysfunction (HCC)    EC Holter 24hr 06/24/13 showed sinus bradycardia with probable chronotropic incompetence. May need pacemaker per Dr. Daneen Schick.  . Skin cancer    Left base (Face) & (Ear in 12/2011).    ROS:   All systems reviewed and negative except as noted in the HPI.   Past Surgical History:  Procedure Laterality Date  . CORONARY ANGIOPLASTY WITH STENT PLACEMENT  07/25/00   MI in 07/25/00 with RCA stenting at that time.  . CORONARY ANGIOPLASTY WITH STENT PLACEMENT  04/14/06   Bare metal stent in proximal LAD [facility] - 3.5 x16 stent)  by Dr. Linard Millers.  [name] PROSTATE BIOPSY     Evidence  . RIGHT THORACOTOMY,OPEN REDUCTION AND INTERNAL FIXATION OF RIBS 4-7  09/25/2010  . STONE EXTRACTION WITH BASKET     Renal     Family History  Problem Relation Age of Onset  . CVA Mother   . Heart attack Father   . Kidney failure Father   . Heart disease Sister        Possible heart disease  . Aneurysm Sister   . Hypertension Brother   . Heart disease Brother   . Heart failure Brother   . Lung disease Brother   . Diabetes Brother      Social History   Socioeconomic History  . Marital status: Single    Spouse name: Not on file  . Number of children: Not on file  . Years of education: Not on file  . Highest education level: Not on file  Occupational History  . Occupation: Artist: RETIRED  Tobacco Use  . Smoking status: Former Smoker    Types: Cigars  . Smokeless tobacco: Never Used  . Tobacco comment: quit 1990  Substance and Sexual Activity  . Alcohol use: Yes    Comment: NOT ON A REGULAR BASIS  . Drug use: No  . Sexual activity: Not on file  Other Topics Concern  . Not on file  Social History Narrative  . Not on file   Social Determinants of Health   Financial Resource Strain:   . Difficulty of Paying Living Expenses: Not on file  Food Insecurity:   . Worried About Charity fundraiser in the Last Year: Not on file  . Ran Out of Food in the Last Year: Not on file  Transportation Needs:   . Lack of Transportation (Medical): Not on file  . Lack of Transportation (Non-Medical): Not on file  Physical Activity:   . Days of Exercise per Week: Not on file  . Minutes of Exercise per Session: Not on file  Stress:   . Feeling of Stress : Not on file  Social Connections:   . Frequency of Communication with Friends and Family: Not on file  . Frequency of Social Gatherings with Friends and Family: Not on file  . Attends Religious Services: Not on file  . Active Member of Clubs or Organizations: Not on file  . Attends Archivist Meetings: Not on file  . Marital Status: Not on file  Intimate Partner Violence:   . Fear of Current or Ex-Partner: Not on file  . Emotionally Abused: Not on file  . Physically Abused: Not on file  . Sexually Abused: Not on file     BP (!) 178/82   Pulse (!) 56   Ht 5' 7.5" (1.715 m)   Wt 167 lb 12.8 oz (76.1 kg)   SpO2 98%   BMI 25.89 kg/m   Physical Exam:  Well appearing NAD HEENT: Unremarkable Neck:  No JVD, no thyromegally Lymphatics:  No adenopathy Back:  No CVA tenderness Lungs:  Clear with no wheezes HEART:  Regular rate rhythm, no murmurs, no rubs, no clicks Abd:  soft, positive bowel sounds, no organomegally, no rebound, no guarding Ext:  2 plus pulses, no edema, no cyanosis, no  clubbing Skin:  No rashes no nodules Neuro:  CN II through XII intact, motor grossly intact  Assess/Plan: 1. Sinus node dysfunction/high grade heart block - the patient remains asymptomatic. He does not have  an indication for PPM insertion. I discussed the warning signs that his conduction system is worsening.  2. HTN - his bp is not well controlled in our office but has been at home.  3. Diastolic heart failure - his symptoms are class2A at most. He is encouraged to avoid sodium.   Mikle Bosworth.D.

## 2019-12-26 DIAGNOSIS — I129 Hypertensive chronic kidney disease with stage 1 through stage 4 chronic kidney disease, or unspecified chronic kidney disease: Secondary | ICD-10-CM | POA: Diagnosis not present

## 2019-12-26 DIAGNOSIS — I2510 Atherosclerotic heart disease of native coronary artery without angina pectoris: Secondary | ICD-10-CM | POA: Diagnosis not present

## 2019-12-26 DIAGNOSIS — N1831 Chronic kidney disease, stage 3a: Secondary | ICD-10-CM | POA: Diagnosis not present

## 2019-12-26 DIAGNOSIS — I495 Sick sinus syndrome: Secondary | ICD-10-CM | POA: Diagnosis not present

## 2020-01-13 NOTE — Progress Notes (Signed)
Cardiology Office Note   Date:  01/15/2020   ID:  Brandon Robles, DOB 10/05/1926, MRN [id number]  PCP:  Lavone Orn, MD  Cardiologist:  Dr. Tamala Julian EP Dr. Lovena Le     Chief Complaint  Patient presents with  . Coronary Artery Disease      History of Present Illness: Brandon Robles is a 86 y.o. male who presents for CAD.   hx of coronary artery disease with prior non-drug-eluting stent implantation during acute inferior in 2001and LAD bare-metal stent 2007, hypertension, hyperlipidemia, sinus node dysfunction, complete heart block, accelerated junctional rhythm and history of pericarditis.  07/15/19 saw Dr. Tamala Julian an continues to do well.  He lives independently.  His daughter prepares supper every night.  His other meals are kcal from fast food places.  He has not had syncope.  He denies chest pain, orthopnea, ankle swelling.  No palpitations.  No episodes of syncope or near syncope. Goal BP 140/80  10/24/19 saw Dr. Lovena Le for sinus node dysfunction/high grade heart block asymptomatic no indication for PPM hsi diastolic HF was class 2 A Has had covid vaccine   Today he is doing well, no chest pain and no SOB unless he pushes himself.  Still works on his farm. 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   Enjoyed his time there.   Past Medical History:  Diagnosis Date  . Blastocystis hominis   . Bradycardia 07/18/2013  . CAD (coronary artery disease)    S/P inferior MI 07/2000 with RCA stenting at that time. In 03/2006 he had a LAD non-drug-eluting stent implanted.  . Chronic insomnia   . Complex renal cyst    Left  . Diverticulitis   . ED (erectile dysfunction)    Vacuum pump  . Essential hypertension, benign    Stable, on lisinopril-HCTZ & Atenolol.  [name] GERD (gastroesophageal reflux disease)   . H/O pericarditis   . Hemorrhoids   . LBBB (left bundle branch block)   . Multiple facial fractures ([place]) 02/25/2016  . Multiple rib fractures 09/25/10   PTX 09/2010  . Nephrolithiasis    Renal stone basketing.  . Pure hypercholesterolemia    On Red Yeast Rice & CoQ 10.  . Sinoatrial node dysfunction (HCC)    EC Holter 24hr 06/24/13 showed sinus bradycardia with probable chronotropic incompetence. May need pacemaker per Dr. Daneen Schick.  . Skin cancer    Left base (Face) & (Ear in 12/2011).    Past Surgical History:  Procedure Laterality Date  . CORONARY ANGIOPLASTY WITH STENT PLACEMENT  07/25/00   MI in 07/25/00 with RCA stenting at that time.  . CORONARY ANGIOPLASTY WITH STENT PLACEMENT  04/14/06   Bare metal stent in proximal LAD [facility] - 3.5 x16 stent) by Dr. Linard Millers.  [name] PROSTATE BIOPSY     Evidence  . RIGHT THORACOTOMY,OPEN REDUCTION AND INTERNAL FIXATION OF RIBS 4-7  09/25/2010  . STONE EXTRACTION WITH BASKET     Renal     Current Outpatient Medications  Medication Sig Dispense Refill  . acetaminophen (TYLENOL) 325 MG tablet Take 2 tablets (650 mg total) by mouth every 4 (four) hours as needed for mild pain.    [name] amoxicillin (AMOXIL) 500 MG capsule Take 4 capsules by mouth as needed 1 hour prior to dental procedures.  0  . aspirin EC 81 MG tablet Take 81 mg by mouth daily.    [name] lisinopril-hydrochlorothiazide (ZESTORETIC) 10-12.5 MG tablet Take 1 tablet by mouth daily. 90 tablet  3  . nitroGLYCERIN (NITROSTAT) 0.4 MG SL tablet Place 0.4 mg under the tongue every 5 (five) minutes as needed for chest pain. (MAXIMUM of 3 TABLETS)    . OVER THE COUNTER MEDICATION Take 2 capsules by mouth daily. OTC [place] capsules    . OVER THE COUNTER MEDICATION Take 1 capsule by mouth daily. OMEGA Q PLUS    . Red Yeast Rice 600 MG CAPS Take 1 capsule by mouth daily.     . temazepam (RESTORIL) 30 MG capsule Take 30 mg by mouth at bedtime.     . Throat Lozenges (FISHERMANS FRIEND MT) Use as directed 1 lozenge in the mouth or throat 2 (two) times daily as needed (congestion).     No current facility-administered medications for this visit.    Allergies:    Vitamin d analogs, Ambien [zolpidem tartrate], and Honey    Social History:  The patient  reports that he has quit smoking. His smoking use included cigars. He has never used smokeless tobacco. He reports current alcohol use. He reports that he does not use drugs.   Family History:  The patient's family history includes Aneurysm in his sister; CVA in his mother; Diabetes in his brother; Heart attack in his father; Heart disease in his brother and sister; Heart failure in his brother; Hypertension in his brother; Kidney failure in his father; Lung disease in his brother.    ROS:  General:no colds or fevers, no weight changes Skin:no rashes or ulcers HEENT:no blurred vision, no congestion CV:see HPI PUL:see HPI GI:no diarrhea constipation or melena, no indigestion GU:no hematuria, no dysuria MS:no joint pain, no claudication Neuro:no syncope, no lightheadedness Endo:no diabetes, no thyroid disease  Wt Readings from Last 3 Encounters:  01/14/20 166 lb (75.3 kg)  10/24/19 167 lb 12.8 oz (76.1 kg)  07/15/19 168 lb (76.2 kg)     PHYSICAL EXAM: VS:  BP (!) 144/60   Pulse (!) 52   Ht 5' 7.5" (1.715 m)   Wt 166 lb (75.3 kg)   SpO2 98%   BMI 25.62 kg/m  , BMI Body mass index is 25.62 kg/m. General:Pleasant affect, NAD Skin:Warm and dry, brisk capillary refill HEENT:normocephalic, sclera clear, mucus membranes moist Neck:supple, no JVD, no bruits  Heart:irreg HR  without murmur, gallup, rub or click Lungs:clear without rales, rhonchi, or wheezes [id number], non tender, + BS, do not palpate liver spleen or masses Ext:no lower ext edema, 2+ pedal pulses, 2+ radial pulses Neuro:alert and oriented X 3, MAE, follows commands, + facial symmetry    EKG:  EKG is NOT ordered today.    Recent Labs: No results found for requested labs within last 8760 hours.    Lipid Panel    Component Value Date/Time   CHOL 140 06/30/2014 1025   TRIG 48.0 06/30/2014 1025   HDL 45.80 06/30/2014  1025   CHOLHDL 3 06/30/2014 1025   VLDL 9.6 06/30/2014 1025   LDLCALC 85 06/30/2014 1025       Other studies Reviewed: Additional studies/ records that were reviewed today include: . 04/26/18  Holter results  Sinus bradycardia, normal sinus rhythm, sinus tachycardia. Average heart rate was 56 bpm. Heart rate ranged from 31 to 103 bpm.  Second-degree type I Wenckebach AV block.  Third-degree AV block with low junctional escape as low as 31 bpm bpm.  Wide-complex tachycardia for 9 beats at 103 bpm  Junctional rhythm at 51 bpm  Bigeminal PVCs, multifocal Ventricular couplets     ASSESSMENT AND PLAN:  1.  CAD in native heart, no angina in active lifestyle. 2.  HTN stable.  Goal in [id number] to [id number] systolic 3.  Heart block followed by EP but no lightheadedness or dizziness 4.  HLD stable on Red Yeast rice alone, all he will take. 5.  Diastolic HF  euvolemic today  follow up with Dr. Tamala Julian in 6 months and prn   Current medicines are reviewed with the patient today.  The patient Has no concerns regarding medicines.  The following changes have been made:  See above Labs/ tests ordered today include:see above  Disposition:   FU:  see above  Signed, Cecilie Kicks, NP  01/15/2020 [facility] [place], [place], [place] Phone: [phone]; Fax: [phone]

## 2020-01-14 ENCOUNTER — Encounter: Payer: Self-pay | Admitting: Cardiology

## 2020-01-14 ENCOUNTER — Ambulatory Visit: Payer: Medicare Other | Admitting: Cardiology

## 2020-01-14 ENCOUNTER — Other Ambulatory Visit: Payer: Self-pay

## 2020-01-14 VITALS — BP 144/60 | HR 52 | Ht 67.5 in | Wt 166.0 lb

## 2020-01-14 DIAGNOSIS — E782 Mixed hyperlipidemia: Secondary | ICD-10-CM

## 2020-01-14 DIAGNOSIS — I442 Atrioventricular block, complete: Secondary | ICD-10-CM

## 2020-01-14 DIAGNOSIS — I2510 Atherosclerotic heart disease of native coronary artery without angina pectoris: Secondary | ICD-10-CM

## 2020-01-14 DIAGNOSIS — I5032 Chronic diastolic (congestive) heart failure: Secondary | ICD-10-CM

## 2020-01-14 DIAGNOSIS — I10 Essential (primary) hypertension: Secondary | ICD-10-CM

## 2020-01-14 NOTE — Patient Instructions (Signed)
Medication Instructions:  Your physician recommends that you continue on your current medications as directed. Please refer to the Current Medication list given to you today.  *If you need a refill on your cardiac medications before your next appointment, please call your pharmacy*   Lab Work: None ordered  If you have labs (blood work) drawn today and your tests are completely normal, you will receive your results only by: . MyChart Message (if you have MyChart) OR . A paper copy in the mail If you have any lab test that is abnormal or we need to change your treatment, we will call you to review the results.   Testing/Procedures: None ordered   Follow-Up: At [facility], you and your health needs are our priority.  As part of our continuing mission to provide you with exceptional heart care, we have created designated Provider Care Teams.  These Care Teams include your primary Cardiologist (physician) and Advanced Practice Providers (APPs -  Physician Assistants and Nurse Practitioners) who all work together to provide you with the care you need, when you need it.  We recommend signing up for the patient portal called "MyChart".  Sign up information is provided on this After Visit Summary.  MyChart is used to connect with patients for Virtual Visits (Telemedicine).  Patients are able to view lab/test results, encounter notes, upcoming appointments, etc.  Non-urgent messages can be sent to your provider as well.   To learn more about what you can do with MyChart, go to [email or web address].    Your next appointment:   6 month(s)  The format for your next appointment:   In Person  Provider:   You may see Henry W Ferreri III, MD or one of the following Advanced Practice Providers on your designated Care Team:    Lori Gerhardt, NP  Laura Ingold, NP  Jill McDaniel, NP    Other Instructions   

## 2020-01-15 ENCOUNTER — Encounter: Payer: Self-pay | Admitting: Cardiology

## 2020-01-22 DIAGNOSIS — H524 Presbyopia: Secondary | ICD-10-CM | POA: Diagnosis not present

## 2020-04-09 DIAGNOSIS — L570 Actinic keratosis: Secondary | ICD-10-CM | POA: Diagnosis not present

## 2020-04-09 DIAGNOSIS — L820 Inflamed seborrheic keratosis: Secondary | ICD-10-CM | POA: Diagnosis not present

## 2020-04-09 DIAGNOSIS — L821 Other seborrheic keratosis: Secondary | ICD-10-CM | POA: Diagnosis not present

## 2020-04-09 DIAGNOSIS — Z85828 Personal history of other malignant neoplasm of skin: Secondary | ICD-10-CM | POA: Diagnosis not present

## 2020-07-21 NOTE — Progress Notes (Signed)
Cardiology Office Note:    Date:  07/22/2020   ID:  Brandon Robles, DOB [birth year], MRN [id number]  PCP:  Lavone Orn, MD  Cardiologist:  Sinclair Grooms, MD   Referring MD: Lavone Orn, MD   Chief Complaint  Patient presents with  . Advice Only    Heart block    History of Present Illness:    Brandon Robles is a 86 y.o. male with a hx of coronary artery disease with prior non-drug-eluting stent implantation during acute inferior in 2001and LAD bare-metal stent 2007, hypertension, hyperlipidemia, sinus node dysfunction, complete heart block, accelerated junctional rhythm and history of pericarditis. Though has bradycardia, he is asymptomatic and not needed pacer (Followed by Dr. Cristopher Peru EP)  Heart rate is slower on today's EKG than prior.  He feels about the same as before but does note that he is having increased dyspnea on exertion and fatigue.  He is worried about the possibility of eventually fainting though he has had no close calls or anything that would suggest to him that he would faint.  This is probably because I have been talking to him about this over the years.  Past Medical History:  Diagnosis Date  . Blastocystis hominis   . Bradycardia 07/18/2013  . CAD (coronary artery disease)    S/P inferior MI 07/2000 with RCA stenting at that time. In 03/2006 he had a LAD non-drug-eluting stent implanted.  . Chronic insomnia   . Complex renal cyst    Left  . Diverticulitis   . ED (erectile dysfunction)    Vacuum pump  . Essential hypertension, benign    Stable, on lisinopril-HCTZ & Atenolol.  [name] GERD (gastroesophageal reflux disease)   . H/O pericarditis   . Hemorrhoids   . LBBB (left bundle branch block)   . Multiple facial fractures ([place]) 02/25/2016  . Multiple rib fractures 09/25/10   PTX 09/2010  . Nephrolithiasis    Renal stone basketing.  . Pure hypercholesterolemia    On Red Yeast Rice & CoQ 10.  . Sinoatrial node dysfunction (HCC)    EC Holter 24hr 06/24/13  showed sinus bradycardia with probable chronotropic incompetence. May need pacemaker per Dr. Daneen Schick.  . Skin cancer    Left base (Face) & (Ear in 12/2011).    Past Surgical History:  Procedure Laterality Date  . CORONARY ANGIOPLASTY WITH STENT PLACEMENT  07/25/00   MI in 07/25/00 with RCA stenting at that time.  . CORONARY ANGIOPLASTY WITH STENT PLACEMENT  04/14/06   Bare metal stent in proximal LAD [facility] - 3.5 x16 stent) by Dr. Linard Millers.  [name] PROSTATE BIOPSY     Evidence  . RIGHT THORACOTOMY,OPEN REDUCTION AND INTERNAL FIXATION OF RIBS 4-7  09/25/2010  . STONE EXTRACTION WITH BASKET     Renal    Current Medications: Current Meds  Medication Sig  . acetaminophen (TYLENOL) 325 MG tablet Take 2 tablets (650 mg total) by mouth every 4 (four) hours as needed for mild pain.  [name] amoxicillin (AMOXIL) 500 MG capsule Take 4 capsules by mouth as needed 1 hour prior to dental procedures.  [name] aspirin EC 81 MG tablet Take 81 mg by mouth daily.  [name] lisinopril-hydrochlorothiazide (ZESTORETIC) 10-12.5 MG tablet Take 1 tablet by mouth daily.  . nitroGLYCERIN (NITROSTAT) 0.4 MG SL tablet Place 0.4 mg under the tongue every 5 (five) minutes as needed for chest pain. (MAXIMUM of 3 TABLETS)  . OVER THE COUNTER MEDICATION Take 2  capsules by mouth daily. OTC [place] capsules  . OVER THE COUNTER MEDICATION Take 1 capsule by mouth daily. OMEGA Q PLUS  . Red Yeast Rice 600 MG CAPS Take 1 capsule by mouth daily.   . temazepam (RESTORIL) 30 MG capsule Take 30 mg by mouth at bedtime.   . Throat Lozenges (FISHERMANS FRIEND MT) Use as directed 1 lozenge in the mouth or throat 2 (two) times daily as needed (congestion).     Allergies:   Vitamin d analogs, Ambien [zolpidem tartrate], and Honey   Social History   Socioeconomic History  . Marital status: Single    Spouse name: Not on file  . Number of children: Not on file  . Years of education: Not on file  . Highest education level: Not  on file  Occupational History  . Occupation: Engineer, technical sales: RETIRED  Tobacco Use  . Smoking status: Former Smoker    Types: Cigars  . Smokeless tobacco: Never Used  . Tobacco comment: quit 1990  Vaping Use  . Vaping Use: Never used  Substance and Sexual Activity  . Alcohol use: Yes    Comment: NOT ON A REGULAR BASIS  . Drug use: No  . Sexual activity: Not on file  Other Topics Concern  . Not on file  Social History Narrative  . Not on file   Social Determinants of Health   Financial Resource Strain:   . Difficulty of Paying Living Expenses: Not on file  Food Insecurity:   . Worried About Charity fundraiser in the Last Year: Not on file  . Ran Out of Food in the Last Year: Not on file  Transportation Needs:   . Lack of Transportation (Medical): Not on file  . Lack of Transportation (Non-Medical): Not on file  Physical Activity:   . Days of Exercise per Week: Not on file  . Minutes of Exercise per Session: Not on file  Stress:   . Feeling of Stress : Not on file  Social Connections:   . Frequency of Communication with Friends and Family: Not on file  . Frequency of Social Gatherings with Friends and Family: Not on file  . Attends Religious Services: Not on file  . Active Member of Clubs or Organizations: Not on file  . Attends Archivist Meetings: Not on file  . Marital Status: Not on file     Family History: The patient's family history includes Aneurysm in his sister; CVA in his mother; Diabetes in his brother; Heart attack in his father; Heart disease in his brother and sister; Heart failure in his brother; Hypertension in his brother; Kidney failure in his father; Lung disease in his brother.  ROS:   Please see the history of present illness.    He is tired.  He is still as active as he has been.  Says his legs feel cold from time to time.  All other systems reviewed and are negative.  EKGs/Labs/Other Studies Reviewed:    The following studies  were reviewed today: No new data  EKG:  EKG sinus rhythm, high-grade AV block, ventricular escape rhythm at 44 bpm.  When compared to the prior tracing from 2020, average heart rate has decreased from 65-44.  Recent Labs: No results found for requested labs within last 8760 hours.  Recent Lipid Panel    Component Value Date/Time   CHOL 140 06/30/2014 1025   TRIG 48.0 06/30/2014 1025   HDL 45.80 06/30/2014 1025  CHOLHDL 3 06/30/2014 1025   VLDL 9.6 06/30/2014 1025   LDLCALC 85 06/30/2014 1025    Physical Exam:    VS:  BP (!) 154/74   Pulse (!) 44   Ht 5\' 7"  (1.702 m)   Wt 160 lb (72.6 kg)   SpO2 98%   BMI 25.06 kg/m     Wt Readings from Last 3 Encounters:  07/22/20 160 lb (72.6 kg)  01/14/20 166 lb (75.3 kg)  10/24/19 167 lb 12.8 oz (76.1 kg)     GEN: Elderly and somewhat frail. No acute distress HEENT: Normal NECK: No JVD. LYMPHATICS: No lymphadenopathy CARDIAC: Cardiac.  RRR without murmur, gallop, or edema. VASCULAR:  Normal Pulses. No bruits. RESPIRATORY:  Clear to auscultation without rales, wheezing or rhonchi  ABDOMEN: Soft, non-tender, non-distended, No pulsatile mass, MUSCULOSKELETAL: No deformity  SKIN: Warm and dry NEUROLOGIC:  Alert and oriented x 3 PSYCHIATRIC:  Normal affect   ASSESSMENT:    1. Coronary artery disease involving native coronary artery of native heart without angina pectoris   2. Third degree heart block ([place])   3. Mixed hyperlipidemia   4. Essential hypertension   5. Chronic diastolic HF (heart failure) ([place])   6. Educated about COVID-19 virus infection    PLAN:    In order of problems listed above:  1. No significant symptoms of angina 2. Ventricular escape rhythm is slower than 1 year ago.  We will place a 72-hour monitor. 3. Continue red yeast rice.  Would not chase cholesterol at his age.  Last LDL was 83 in 2017. 4. Continue Zestoretic 10/12.5 mg daily 5. No clinical evidence of volume overload. 6. Vaccinated and  looking forward to the booster.  1 year follow-up unless monitor shows concern.   Medication Adjustments/Labs and Tests Ordered: Current medicines are reviewed at length with the patient today.  Concerns regarding medicines are outlined above.  Orders Placed This Encounter  Procedures  . EKG 12-Lead   No orders of the defined types were placed in this encounter.   There are no Patient Instructions on file for this visit.   Signed, Sinclair Grooms, MD  07/22/2020 11:49 AM    [place]

## 2020-07-22 ENCOUNTER — Ambulatory Visit: Payer: Medicare Other | Admitting: Interventional Cardiology

## 2020-07-22 ENCOUNTER — Other Ambulatory Visit: Payer: Self-pay

## 2020-07-22 ENCOUNTER — Encounter: Payer: Self-pay | Admitting: *Deleted

## 2020-07-22 ENCOUNTER — Encounter: Payer: Self-pay | Admitting: Interventional Cardiology

## 2020-07-22 VITALS — BP 154/74 | HR 44 | Ht 67.0 in | Wt 160.0 lb

## 2020-07-22 DIAGNOSIS — I442 Atrioventricular block, complete: Secondary | ICD-10-CM | POA: Diagnosis not present

## 2020-07-22 DIAGNOSIS — I2510 Atherosclerotic heart disease of native coronary artery without angina pectoris: Secondary | ICD-10-CM | POA: Diagnosis not present

## 2020-07-22 DIAGNOSIS — Z7189 Other specified counseling: Secondary | ICD-10-CM

## 2020-07-22 DIAGNOSIS — I10 Essential (primary) hypertension: Secondary | ICD-10-CM

## 2020-07-22 DIAGNOSIS — E782 Mixed hyperlipidemia: Secondary | ICD-10-CM

## 2020-07-22 DIAGNOSIS — I5032 Chronic diastolic (congestive) heart failure: Secondary | ICD-10-CM

## 2020-07-22 DIAGNOSIS — R001 Bradycardia, unspecified: Secondary | ICD-10-CM

## 2020-07-22 NOTE — Patient Instructions (Signed)
Medication Instructions:  Your physician recommends that you continue on your current medications as directed. Please refer to the Current Medication list given to you today.  *If you need a refill on your cardiac medications before your next appointment, please call your pharmacy*   Lab Work: None If you have labs (blood work) drawn today and your tests are completely normal, you will receive your results only by: [name] MyChart Message (if you have MyChart) OR . A paper copy in the mail If you have any lab test that is abnormal or we need to change your treatment, we will call you to review the results.   Testing/Procedures: Your physician recommends that you wear a monitor for 3 days.     Follow-Up: At [facility], you and your health needs are our priority.  As part of our continuing mission to provide you with exceptional heart care, we have created designated Provider Care Teams.  These Care Teams include your primary Cardiologist (physician) and Advanced Practice Providers (APPs -  Physician Assistants and Nurse Practitioners) who all work together to provide you with the care you need, when you need it.  We recommend signing up for the patient portal called "MyChart".  Sign up information is provided on this After Visit Summary.  MyChart is used to connect with patients for Virtual Visits (Telemedicine).  Patients are able to view lab/test results, encounter notes, upcoming appointments, etc.  Non-urgent messages can be sent to your provider as well.   To learn more about what you can do with MyChart, go to [email or web address].    Your next appointment:   12 month(s)  The format for your next appointment:   In Person  Provider:   You may see Sinclair Grooms, MD or one of the following Advanced Practice Providers on your designated Care Team:    Truitt Merle, NP  Cecilie Kicks, NP  Kathyrn Drown, NP    Other Instructions

## 2020-07-22 NOTE — Progress Notes (Signed)
Patient ID: Brandon Robles, male   DOB: Jul 22, 1926, 86 y.o.   MRN: [id number] Patient enrolled for [employer] to ship a 3 day ZIO XT long term holter monitor to his home.  Letter with instructions mailed to patient.

## 2020-07-24 ENCOUNTER — Other Ambulatory Visit (INDEPENDENT_AMBULATORY_CARE_PROVIDER_SITE_OTHER): Payer: Medicare Other

## 2020-07-24 DIAGNOSIS — R001 Bradycardia, unspecified: Secondary | ICD-10-CM

## 2020-08-05 ENCOUNTER — Telehealth: Payer: Self-pay | Admitting: Interventional Cardiology

## 2020-08-05 DIAGNOSIS — R001 Bradycardia, unspecified: Secondary | ICD-10-CM | POA: Diagnosis not present

## 2020-08-05 NOTE — Telephone Encounter (Signed)
Brandon Robles with [employer] called to report abnormal Zio monitor results.  Phone number: [phone] ref#: [id number]

## 2020-08-05 NOTE — Telephone Encounter (Signed)
Per Brandon Robles pt wore 2 day Zio patch from 10/8-11/21 and had 788 episodes of complete heart block  HR of 23  ./cy

## 2020-08-05 NOTE — Telephone Encounter (Addendum)
Lm for pt to call back ./cy 

## 2020-08-06 NOTE — Telephone Encounter (Signed)
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

## 2020-08-07 NOTE — Telephone Encounter (Signed)
Dr. Lovena Le preliminarily reviewed HM.  HM ordered by Dr. Tamala Julian.  No action needed per Dr. Lovena Le.

## 2020-09-01 DIAGNOSIS — E7800 Pure hypercholesterolemia, unspecified: Secondary | ICD-10-CM | POA: Diagnosis not present

## 2020-09-01 DIAGNOSIS — Z0000 Encounter for general adult medical examination without abnormal findings: Secondary | ICD-10-CM | POA: Diagnosis not present

## 2020-09-01 DIAGNOSIS — Z1389 Encounter for screening for other disorder: Secondary | ICD-10-CM | POA: Diagnosis not present

## 2020-09-01 DIAGNOSIS — N1831 Chronic kidney disease, stage 3a: Secondary | ICD-10-CM | POA: Diagnosis not present

## 2020-09-01 DIAGNOSIS — I2510 Atherosclerotic heart disease of native coronary artery without angina pectoris: Secondary | ICD-10-CM | POA: Diagnosis not present

## 2020-09-01 DIAGNOSIS — I129 Hypertensive chronic kidney disease with stage 1 through stage 4 chronic kidney disease, or unspecified chronic kidney disease: Secondary | ICD-10-CM | POA: Diagnosis not present

## 2020-10-17 ENCOUNTER — Other Ambulatory Visit: Payer: Self-pay | Admitting: Internal Medicine

## 2020-10-23 ENCOUNTER — Encounter: Payer: Self-pay | Admitting: Internal Medicine

## 2020-10-23 ENCOUNTER — Ambulatory Visit: Payer: Medicare Other | Admitting: Internal Medicine

## 2020-10-23 ENCOUNTER — Other Ambulatory Visit: Payer: Self-pay

## 2020-10-23 VITALS — BP 134/60 | HR 78 | Ht 67.0 in | Wt 165.0 lb

## 2020-10-23 DIAGNOSIS — I442 Atrioventricular block, complete: Secondary | ICD-10-CM

## 2020-10-23 DIAGNOSIS — I10 Essential (primary) hypertension: Secondary | ICD-10-CM | POA: Diagnosis not present

## 2020-10-23 DIAGNOSIS — I5020 Unspecified systolic (congestive) heart failure: Secondary | ICD-10-CM | POA: Insufficient documentation

## 2020-10-23 DIAGNOSIS — I5032 Chronic diastolic (congestive) heart failure: Secondary | ICD-10-CM

## 2020-10-23 NOTE — Progress Notes (Signed)
HPI [name] returns today for followup of his bradycardia. He is a pleasant elderly woman with a h/o HTN, high grade heart block, and sinus bradycardia. 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 He remains active. He has not had syncope. 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 He denies anginal symptoms or sob. No syncope. He wore a cardiac monitor demonstrating heart block in the early morning. Allergies  Allergen Reactions  . Vitamin D Analogs Other (See Comments)    "pins and needles feeling"  . Fish Oil Other (See Comments)  . Ambien [Zolpidem Tartrate] Other (See Comments)    Confusion  . Honey Other (See Comments)    "pins and needles feeling"     Current Outpatient Medications  Medication Sig Dispense Refill  . acetaminophen (TYLENOL) 325 MG tablet Take 2 tablets (650 mg total) by mouth every 4 (four) hours as needed for mild pain.    [name] amoxicillin (AMOXIL) 500 MG capsule Take 4 capsules by mouth as needed 1 hour prior to dental procedures.  0  . aspirin EC 81 MG tablet Take 81 mg by mouth daily.    [name] lisinopril-hydrochlorothiazide (ZESTORETIC) 10-12.5 MG tablet TAKE ONE TABLET EVERY DAY 90 tablet 0  . nitroGLYCERIN (NITROSTAT) 0.4 MG SL tablet Place 0.4 mg under the tongue every 5 (five) minutes as needed for chest pain. (MAXIMUM of 3 TABLETS)    . OVER THE COUNTER MEDICATION Take 2 capsules by mouth daily. OTC [place] capsules    . OVER THE COUNTER MEDICATION Take 1 capsule by mouth daily. OMEGA Q PLUS    . Red Yeast Rice 600 MG CAPS Take 1 capsule by mouth daily.     . temazepam (RESTORIL) 30 MG capsule Take 30 mg by mouth at bedtime.    . Throat Lozenges ([employer] FRIEND MT) Use as directed 1 lozenge in the mouth or throat 2 (two) times daily as needed (congestion).     No current facility-administered medications for this visit.     Past Medical History:  Diagnosis Date  . Blastocystis hominis   . Bradycardia  07/18/2013  . CAD (coronary artery disease)    S/P inferior MI 07/2000 with RCA stenting at that time. In 03/2006 he had a LAD non-drug-eluting stent implanted.  . Chronic insomnia   . Complex renal cyst    Left  . Diverticulitis   . ED (erectile dysfunction)    Vacuum pump  . Essential hypertension, benign    Stable, on lisinopril-HCTZ & Atenolol.  [name] GERD (gastroesophageal reflux disease)   . H/O pericarditis   . Hemorrhoids   . LBBB (left bundle branch block)   . Multiple facial fractures ([place]) 02/25/2016  . Multiple rib fractures 09/25/10   PTX 09/2010  . Nephrolithiasis    Renal stone basketing.  . Pure hypercholesterolemia    On Red Yeast Rice & CoQ 10.  . Sinoatrial node dysfunction (HCC)    EC Holter 24hr 06/24/13 showed sinus bradycardia with probable chronotropic incompetence. May need pacemaker per Dr. Daneen Schick.  . Skin cancer    Left base (Face) & (Ear in 12/2011).    ROS:   All systems reviewed and negative except as noted in the HPI.   Past Surgical History:  Procedure Laterality Date  . CORONARY ANGIOPLASTY WITH STENT PLACEMENT  07/25/00   MI in 07/25/00 with RCA stenting at that time.  . CORONARY  ANGIOPLASTY WITH STENT PLACEMENT  04/14/06   Bare metal stent in proximal LAD [facility] - 3.5 x16 stent) by Dr. Linard Millers.  [name] PROSTATE BIOPSY     Evidence  . RIGHT THORACOTOMY,OPEN REDUCTION AND INTERNAL FIXATION OF RIBS 4-7  09/25/2010  . STONE EXTRACTION WITH BASKET     Renal     Family History  Problem Relation Age of Onset  . CVA Mother   . Heart attack Father   . Kidney failure Father   . Heart disease Sister        Possible heart disease  . Aneurysm Sister   . Hypertension Brother   . Heart disease Brother   . Heart failure Brother   . Lung disease Brother   . Diabetes Brother      Social History   Socioeconomic History  . Marital status: Single    Spouse name: Not on file  . Number of children: Not on file  . Years of  education: Not on file  . Highest education level: Not on file  Occupational History  . Occupation: Engineer, technical sales: RETIRED  Tobacco Use  . Smoking status: Former Smoker    Types: Cigars  . Smokeless tobacco: Never Used  . Tobacco comment: quit 1990  Vaping Use  . Vaping Use: Never used  Substance and Sexual Activity  . Alcohol use: Yes    Comment: NOT ON A REGULAR BASIS  . Drug use: No  . Sexual activity: Not on file  Other Topics Concern  . Not on file  Social History Narrative  . Not on file   Social Determinants of Health   Financial Resource Strain: Not on file  Food Insecurity: Not on file  Transportation Needs: Not on file  Physical Activity: Not on file  Stress: Not on file  Social Connections: Not on file  Intimate Partner Violence: Not on file     BP 134/60   Pulse 78   Ht 5\' 7"  (1.702 m)   Wt 165 lb (74.8 kg)   SpO2 92%   BMI 25.84 kg/m   Physical Exam:  Well appearing NAD HEENT: Unremarkable Neck:  No JVD, no thyromegally Lymphatics:  No adenopathy Back:  No CVA tenderness Lungs:  Clear with no wheezes HEART:  Regular rate rhythm, no murmurs, no rubs, no clicks Abd:  soft, positive bowel sounds, no organomegally, no rebound, no guarding Ext:  2 plus pulses, no edema, no cyanosis, no clubbing Skin:  No rashes no nodules Neuro:  CN II through XII intact, motor grossly intact  EKG - none   Assess/Plan: 1. Heart block - he remains asymptomatic. He will undergo watchful waiting. I discussed the symptoms he might expereince if his conduction system was worsening. 2. HTN - her bp is controlled. He will continue his current meds.  Carleene Overlie Jamorion Gomillion,MD

## 2020-10-23 NOTE — Patient Instructions (Addendum)
Medication Instructions:  Your physician recommends that you continue on your current medications as directed. Please refer to the Current Medication list given to you today.  Labwork: None ordered.  Testing/Procedures: None ordered.  Follow-Up: Your physician wants you to follow-up in: one year with Gregg Taylor, MD   Any Other Special Instructions Will Be Listed Below (If Applicable).  If you need a refill on your cardiac medications before your next appointment, please call your pharmacy.      

## 2021-01-28 ENCOUNTER — Other Ambulatory Visit: Payer: Self-pay | Admitting: Internal Medicine

## 2021-03-02 DIAGNOSIS — J310 Chronic rhinitis: Secondary | ICD-10-CM | POA: Diagnosis not present

## 2021-03-02 DIAGNOSIS — I129 Hypertensive chronic kidney disease with stage 1 through stage 4 chronic kidney disease, or unspecified chronic kidney disease: Secondary | ICD-10-CM | POA: Diagnosis not present

## 2021-03-02 DIAGNOSIS — I2510 Atherosclerotic heart disease of native coronary artery without angina pectoris: Secondary | ICD-10-CM | POA: Diagnosis not present

## 2021-04-12 DIAGNOSIS — L570 Actinic keratosis: Secondary | ICD-10-CM | POA: Diagnosis not present

## 2021-04-12 DIAGNOSIS — L814 Other melanin hyperpigmentation: Secondary | ICD-10-CM | POA: Diagnosis not present

## 2021-04-12 DIAGNOSIS — L821 Other seborrheic keratosis: Secondary | ICD-10-CM | POA: Diagnosis not present

## 2021-04-12 DIAGNOSIS — Z85828 Personal history of other malignant neoplasm of skin: Secondary | ICD-10-CM | POA: Diagnosis not present

## 2021-07-25 ENCOUNTER — Other Ambulatory Visit: Payer: Self-pay

## 2021-07-25 ENCOUNTER — Emergency Department (HOSPITAL_COMMUNITY): Payer: Medicare Other

## 2021-07-25 ENCOUNTER — Encounter (HOSPITAL_COMMUNITY): Payer: Self-pay | Admitting: *Deleted

## 2021-07-25 ENCOUNTER — Inpatient Hospital Stay (HOSPITAL_COMMUNITY)
Admission: EM | Admit: 2021-07-25 | Discharge: 2021-07-28 | DRG: 291 | Disposition: A | Discharge status: Home / Self Care | Payer: Medicare Other | Attending: Internal Medicine | Admitting: Internal Medicine

## 2021-07-25 DIAGNOSIS — I2510 Atherosclerotic heart disease of native coronary artery without angina pectoris: Secondary | ICD-10-CM | POA: Diagnosis not present

## 2021-07-25 DIAGNOSIS — Z8249 Family history of ischemic heart disease and other diseases of the circulatory system: Secondary | ICD-10-CM

## 2021-07-25 DIAGNOSIS — I5043 Acute on chronic combined systolic (congestive) and diastolic (congestive) heart failure: Secondary | ICD-10-CM | POA: Diagnosis present

## 2021-07-25 DIAGNOSIS — E785 Hyperlipidemia, unspecified: Secondary | ICD-10-CM | POA: Diagnosis present

## 2021-07-25 DIAGNOSIS — D72828 Other elevated white blood cell count: Secondary | ICD-10-CM | POA: Diagnosis present

## 2021-07-25 DIAGNOSIS — I5020 Unspecified systolic (congestive) heart failure: Secondary | ICD-10-CM | POA: Diagnosis present

## 2021-07-25 DIAGNOSIS — I429 Cardiomyopathy, unspecified: Secondary | ICD-10-CM | POA: Diagnosis present

## 2021-07-25 DIAGNOSIS — Z833 Family history of diabetes mellitus: Secondary | ICD-10-CM

## 2021-07-25 DIAGNOSIS — R739 Hyperglycemia, unspecified: Secondary | ICD-10-CM | POA: Diagnosis present

## 2021-07-25 DIAGNOSIS — J441 Chronic obstructive pulmonary disease with (acute) exacerbation: Secondary | ICD-10-CM | POA: Diagnosis not present

## 2021-07-25 DIAGNOSIS — E7800 Pure hypercholesterolemia, unspecified: Secondary | ICD-10-CM | POA: Diagnosis not present

## 2021-07-25 DIAGNOSIS — I130 Hypertensive heart and chronic kidney disease with heart failure and stage 1 through stage 4 chronic kidney disease, or unspecified chronic kidney disease: Secondary | ICD-10-CM | POA: Diagnosis not present

## 2021-07-25 DIAGNOSIS — J9811 Atelectasis: Secondary | ICD-10-CM | POA: Diagnosis not present

## 2021-07-25 DIAGNOSIS — I5023 Acute on chronic systolic (congestive) heart failure: Secondary | ICD-10-CM | POA: Diagnosis not present

## 2021-07-25 DIAGNOSIS — Z7982 Long term (current) use of aspirin: Secondary | ICD-10-CM

## 2021-07-25 DIAGNOSIS — R778 Other specified abnormalities of plasma proteins: Secondary | ICD-10-CM | POA: Diagnosis present

## 2021-07-25 DIAGNOSIS — J90 Pleural effusion, not elsewhere classified: Secondary | ICD-10-CM | POA: Diagnosis not present

## 2021-07-25 DIAGNOSIS — R0689 Other abnormalities of breathing: Secondary | ICD-10-CM | POA: Diagnosis not present

## 2021-07-25 DIAGNOSIS — Z66 Do not resuscitate: Secondary | ICD-10-CM | POA: Diagnosis not present

## 2021-07-25 DIAGNOSIS — J449 Chronic obstructive pulmonary disease, unspecified: Secondary | ICD-10-CM | POA: Diagnosis present

## 2021-07-25 DIAGNOSIS — I10 Essential (primary) hypertension: Secondary | ICD-10-CM | POA: Diagnosis not present

## 2021-07-25 DIAGNOSIS — E872 Acidosis, unspecified: Secondary | ICD-10-CM | POA: Diagnosis not present

## 2021-07-25 DIAGNOSIS — Z951 Presence of aortocoronary bypass graft: Secondary | ICD-10-CM

## 2021-07-25 DIAGNOSIS — Z955 Presence of coronary angioplasty implant and graft: Secondary | ICD-10-CM | POA: Diagnosis not present

## 2021-07-25 DIAGNOSIS — R069 Unspecified abnormalities of breathing: Secondary | ICD-10-CM | POA: Diagnosis not present

## 2021-07-25 DIAGNOSIS — I442 Atrioventricular block, complete: Secondary | ICD-10-CM | POA: Diagnosis present

## 2021-07-25 DIAGNOSIS — N1831 Chronic kidney disease, stage 3a: Secondary | ICD-10-CM | POA: Diagnosis present

## 2021-07-25 DIAGNOSIS — K219 Gastro-esophageal reflux disease without esophagitis: Secondary | ICD-10-CM | POA: Diagnosis present

## 2021-07-25 DIAGNOSIS — I248 Other forms of acute ischemic heart disease: Secondary | ICD-10-CM | POA: Diagnosis not present

## 2021-07-25 DIAGNOSIS — I5021 Acute systolic (congestive) heart failure: Secondary | ICD-10-CM | POA: Diagnosis not present

## 2021-07-25 DIAGNOSIS — S2231XA Fracture of one rib, right side, initial encounter for closed fracture: Secondary | ICD-10-CM | POA: Diagnosis not present

## 2021-07-25 DIAGNOSIS — J9601 Acute respiratory failure with hypoxia: Secondary | ICD-10-CM | POA: Diagnosis present

## 2021-07-25 DIAGNOSIS — I509 Heart failure, unspecified: Secondary | ICD-10-CM

## 2021-07-25 DIAGNOSIS — Z79899 Other long term (current) drug therapy: Secondary | ICD-10-CM

## 2021-07-25 DIAGNOSIS — I110 Hypertensive heart disease with heart failure: Secondary | ICD-10-CM | POA: Diagnosis not present

## 2021-07-25 DIAGNOSIS — Z20822 Contact with and (suspected) exposure to covid-19: Secondary | ICD-10-CM | POA: Diagnosis not present

## 2021-07-25 DIAGNOSIS — I447 Left bundle-branch block, unspecified: Secondary | ICD-10-CM | POA: Diagnosis not present

## 2021-07-25 DIAGNOSIS — I5033 Acute on chronic diastolic (congestive) heart failure: Secondary | ICD-10-CM | POA: Diagnosis present

## 2021-07-25 DIAGNOSIS — I517 Cardiomegaly: Secondary | ICD-10-CM | POA: Diagnosis not present

## 2021-07-25 DIAGNOSIS — R0602 Shortness of breath: Secondary | ICD-10-CM | POA: Diagnosis present

## 2021-07-25 DIAGNOSIS — J811 Chronic pulmonary edema: Secondary | ICD-10-CM | POA: Diagnosis not present

## 2021-07-25 DIAGNOSIS — I252 Old myocardial infarction: Secondary | ICD-10-CM

## 2021-07-25 DIAGNOSIS — Z87891 Personal history of nicotine dependence: Secondary | ICD-10-CM | POA: Diagnosis not present

## 2021-07-25 DIAGNOSIS — R0600 Dyspnea, unspecified: Secondary | ICD-10-CM | POA: Diagnosis not present

## 2021-07-25 DIAGNOSIS — N183 Chronic kidney disease, stage 3 unspecified: Secondary | ICD-10-CM | POA: Diagnosis present

## 2021-07-25 DIAGNOSIS — I469 Cardiac arrest, cause unspecified: Secondary | ICD-10-CM | POA: Diagnosis not present

## 2021-07-25 DIAGNOSIS — R7989 Other specified abnormal findings of blood chemistry: Secondary | ICD-10-CM | POA: Diagnosis present

## 2021-07-25 LAB — I-STAT CHEM 8, ED
BUN: 35 mg/dL — ABNORMAL HIGH (ref 8–23)
Calcium, Ion: 1.11 mmol/L — ABNORMAL LOW (ref 1.15–1.40)
Chloride: 108 mmol/L (ref 98–111)
Creatinine, Ser: 1.4 mg/dL — ABNORMAL HIGH (ref 0.61–1.24)
Glucose, Bld: 249 mg/dL — ABNORMAL HIGH (ref 70–99)
HCT: 40 % (ref 39.0–52.0)
Hemoglobin: 13.6 g/dL (ref 13.0–17.0)
Potassium: 4.8 mmol/L (ref 3.5–5.1)
Sodium: 139 mmol/L (ref 135–145)
TCO2: 23 mmol/L (ref 22–32)

## 2021-07-25 LAB — I-STAT ARTERIAL BLOOD GAS, ED
Acid-base deficit: 6 mmol/L — ABNORMAL HIGH (ref 0.0–2.0)
Bicarbonate: 22.7 mmol/L (ref 20.0–28.0)
Calcium, Ion: 1.19 mmol/L (ref 1.15–1.40)
HCT: 38 % — ABNORMAL LOW (ref 39.0–52.0)
Hemoglobin: 12.9 g/dL — ABNORMAL LOW (ref 13.0–17.0)
O2 Saturation: 69 %
Patient temperature: 98.6
Potassium: 3.9 mmol/L (ref 3.5–5.1)
Sodium: 140 mmol/L (ref 135–145)
TCO2: 24 mmol/L (ref 22–32)
pCO2 arterial: 55 mmHg — ABNORMAL HIGH (ref 32.0–48.0)
pH, Arterial: 7.223 — ABNORMAL LOW (ref 7.350–7.450)
pO2, Arterial: 43 mmHg — ABNORMAL LOW (ref 83.0–108.0)

## 2021-07-25 LAB — CBC WITH DIFFERENTIAL/PLATELET
Abs Immature Granulocytes: 0.07 10*3/uL (ref 0.00–0.07)
Basophils Absolute: 0.1 10*3/uL (ref 0.0–0.1)
Basophils Relative: 1 %
Eosinophils Absolute: 0.3 10*3/uL (ref 0.0–0.5)
Eosinophils Relative: 3 %
HCT: 40 % (ref 39.0–52.0)
Hemoglobin: 12.6 g/dL — ABNORMAL LOW (ref 13.0–17.0)
Immature Granulocytes: 1 %
Lymphocytes Relative: 24 %
Lymphs Abs: 2.9 10*3/uL (ref 0.7–4.0)
MCH: 32.7 pg (ref 26.0–34.0)
MCHC: 31.5 g/dL (ref 30.0–36.0)
MCV: 103.9 fL — ABNORMAL HIGH (ref 80.0–100.0)
Monocytes Absolute: 1.2 10*3/uL — ABNORMAL HIGH (ref 0.1–1.0)
Monocytes Relative: 10 %
Neutro Abs: 7.6 10*3/uL (ref 1.7–7.7)
Neutrophils Relative %: 61 %
Platelets: 259 10*3/uL (ref 150–400)
RBC: 3.85 MIL/uL — ABNORMAL LOW (ref 4.22–5.81)
RDW: 14.5 % (ref 11.5–15.5)
WBC: 12.2 10*3/uL — ABNORMAL HIGH (ref 4.0–10.5)
nRBC: 0 % (ref 0.0–0.2)

## 2021-07-25 LAB — COMPREHENSIVE METABOLIC PANEL
ALT: 22 U/L (ref 0–44)
AST: 34 U/L (ref 15–41)
Albumin: 4 g/dL (ref 3.5–5.0)
Alkaline Phosphatase: 43 U/L (ref 38–126)
Anion gap: 16 — ABNORMAL HIGH (ref 5–15)
BUN: 27 mg/dL — ABNORMAL HIGH (ref 8–23)
CO2: 18 mmol/L — ABNORMAL LOW (ref 22–32)
Calcium: 8.9 mg/dL (ref 8.9–10.3)
Chloride: 104 mmol/L (ref 98–111)
Creatinine, Ser: 1.52 mg/dL — ABNORMAL HIGH (ref 0.61–1.24)
GFR, Estimated: 43 mL/min — ABNORMAL LOW (ref >60)
Glucose, Bld: 248 mg/dL — ABNORMAL HIGH (ref 70–99)
Potassium: 4.8 mmol/L (ref 3.5–5.1)
Sodium: 138 mmol/L (ref 135–145)
Total Bilirubin: 1 mg/dL (ref 0.3–1.2)
Total Protein: 6.5 g/dL (ref 6.5–8.1)

## 2021-07-25 LAB — TROPONIN I (HIGH SENSITIVITY)
Troponin I (High Sensitivity): 32 ng/L — ABNORMAL HIGH (ref <18)
Troponin I (High Sensitivity): 286 ng/L (ref <18)

## 2021-07-25 LAB — RESP PANEL BY RT-PCR (FLU A&B, COVID) ARPGX2
Influenza A by PCR: NEGATIVE
Influenza B by PCR: NEGATIVE
SARS Coronavirus 2 by RT PCR: NEGATIVE

## 2021-07-25 LAB — BRAIN NATRIURETIC PEPTIDE: B Natriuretic Peptide: 742.7 pg/mL — ABNORMAL HIGH (ref 0.0–100.0)

## 2021-07-25 LAB — MAGNESIUM: Magnesium: 2 mg/dL (ref 1.7–2.4)

## 2021-07-25 LAB — LACTIC ACID, PLASMA
Lactic Acid, Venous: 3.5 mmol/L (ref 0.5–1.9)
Lactic Acid, Venous: 2.6 mmol/L (ref 0.5–1.9)

## 2021-07-25 MED ORDER — IOHEXOL 350 MG/ML SOLN
80.0000 mL | Freq: Once | INTRAVENOUS | Status: AC | PRN
  Administered 2021-07-25: 80 mL via INTRAVENOUS

## 2021-07-25 MED ORDER — IPRATROPIUM BROMIDE 0.02 % IN SOLN
RESPIRATORY_TRACT | Status: AC
  Filled 2021-07-25: qty 5

## 2021-07-25 MED ORDER — ALBUTEROL SULFATE (2.5 MG/3ML) 0.083% IN NEBU: 2.5000 mg | INHALATION_SOLUTION | RESPIRATORY_TRACT | Status: DC | PRN

## 2021-07-25 MED ORDER — ALBUTEROL SULFATE HFA 108 (90 BASE) MCG/ACT IN AERS: 2.0000 | INHALATION_SPRAY | Freq: Once | RESPIRATORY_TRACT | Status: DC

## 2021-07-25 MED ORDER — ACETAMINOPHEN 650 MG RE SUPP: 650.0000 mg | Freq: Four times a day (QID) | RECTAL | Status: DC | PRN

## 2021-07-25 MED ORDER — ALBUTEROL SULFATE (2.5 MG/3ML) 0.083% IN NEBU
INHALATION_SOLUTION | RESPIRATORY_TRACT | Status: AC
  Filled 2021-07-25: qty 6

## 2021-07-25 MED ORDER — ASPIRIN 325 MG PO TABS
325.0000 mg | ORAL_TABLET | Freq: Once | ORAL | Status: AC
  Administered 2021-07-26: 325 mg via ORAL
  Filled 2021-07-25: qty 1

## 2021-07-25 MED ORDER — FUROSEMIDE 10 MG/ML IJ SOLN
40.0000 mg | Freq: Once | INTRAMUSCULAR | Status: AC
  Administered 2021-07-25: 40 mg via INTRAVENOUS
  Filled 2021-07-25: qty 4

## 2021-07-25 MED ORDER — IPRATROPIUM BROMIDE 0.02 % IN SOLN
1.0000 mg | Freq: Once | RESPIRATORY_TRACT | Status: AC
  Administered 2021-07-25: 1 mg via RESPIRATORY_TRACT

## 2021-07-25 MED ORDER — MELATONIN 3 MG PO TABS
3.0000 mg | ORAL_TABLET | Freq: Every evening | ORAL | Status: DC | PRN
  Administered 2021-07-26: 3 mg via ORAL
  Filled 2021-07-25: qty 1

## 2021-07-25 MED ORDER — ACETAMINOPHEN 325 MG PO TABS
650.0000 mg | ORAL_TABLET | Freq: Four times a day (QID) | ORAL | Status: DC | PRN
  Filled 2021-07-25: qty 2

## 2021-07-25 MED ORDER — FUROSEMIDE 10 MG/ML IJ SOLN
40.0000 mg | Freq: Two times a day (BID) | INTRAMUSCULAR | Status: DC
  Administered 2021-07-26: 40 mg via INTRAVENOUS
  Filled 2021-07-25: qty 4

## 2021-07-25 MED ORDER — ALBUTEROL SULFATE (2.5 MG/3ML) 0.083% IN NEBU
5.0000 mg | INHALATION_SOLUTION | Freq: Once | RESPIRATORY_TRACT | Status: AC
  Administered 2021-07-25: 5 mg via RESPIRATORY_TRACT

## 2021-07-25 MED ORDER — METHYLPREDNISOLONE SODIUM SUCC 125 MG IJ SOLR
125.0000 mg | Freq: Once | INTRAMUSCULAR | Status: AC
  Administered 2021-07-25: 125 mg via INTRAVENOUS
  Filled 2021-07-25: qty 2

## 2021-07-25 NOTE — ED Provider Notes (Signed)
[place] EMERGENCY DEPARTMENT Provider Note   CSN: [id number] Arrival date & time: 07/25/21  1922     History Chief Complaint  Patient presents with   Respiratory Distress    KENYON EICHELBERGER is a 87 y.o. male hx of CAD s/p, HTN, complete heart block, here presenting with respiratory distress.  Patient says he is chronically short of breath with exertion.  Patient has worsening shortness of breath today.  Patient was noted to be tachypneic and very short of breath.  He became diaphoretic while EMS was there.  His initial EKG showed bundle branch block so no code STEMI was activated.  Patient denies any chest pain.  Patient was given aspirin prior to arrival  The history is provided by the patient.      Past Medical History:  Diagnosis Date   Blastocystis hominis    Bradycardia 07/18/2013   CAD (coronary artery disease)    S/P inferior MI 07/2000 with RCA stenting at that time. In 03/2006 he had a LAD non-drug-eluting stent implanted.   Chronic insomnia    Complex renal cyst    Left   Diverticulitis    ED (erectile dysfunction)    Vacuum pump   Essential hypertension, benign    Stable, on lisinopril-HCTZ & Atenolol.   GERD (gastroesophageal reflux disease)    H/O pericarditis    Hemorrhoids    LBBB (left bundle branch block)    Multiple facial fractures ([place]) 02/25/2016   Multiple rib fractures 09/25/10   PTX 09/2010   Nephrolithiasis    Renal stone basketing.   Pure hypercholesterolemia    On Red Yeast Rice & CoQ 10.   Sinoatrial node dysfunction (HCC)    EC Holter 24hr 06/24/13 showed sinus bradycardia with probable chronotropic incompetence. May need pacemaker per Dr. Daneen Schick.   Skin cancer    Left base (Face) & (Ear in 12/2011).    Patient Active Problem List   Diagnosis Date Noted   Chronic diastolic heart failure ([place]) 10/23/2020   Hypertension 10/24/2019   Complete heart block, transient ([place]) 12/29/2016   Fall 02/25/2016   Multiple facial  fractures ([place]) 02/25/2016   Fracture of phalanx of right thumb 02/25/2016   Traumatic subdural hematoma 02/24/2016   Bradycardia 07/18/2013   Nephrolithiasis    Diverticulitis    Hemorrhoids    ED (erectile dysfunction)    GERD (gastroesophageal reflux disease)    Hyperlipemia    CAD (coronary artery disease)     Past Surgical History:  Procedure Laterality Date   CORONARY ANGIOPLASTY WITH STENT PLACEMENT  07/25/00   MI in 07/25/00 with RCA stenting at that time.   CORONARY ANGIOPLASTY WITH STENT PLACEMENT  04/14/06   Bare metal stent in proximal LAD [facility] - 3.5 x16 stent) by Dr. Linard Millers.   PROSTATE BIOPSY     Evidence   RIGHT THORACOTOMY,OPEN REDUCTION AND INTERNAL FIXATION OF RIBS 4-7  09/25/2010   STONE EXTRACTION WITH BASKET     Renal       Family History  Problem Relation Age of Onset   CVA Mother    Heart attack Father    Kidney failure Father    Heart disease Sister        Possible heart disease   Aneurysm Sister    Hypertension Brother    Heart disease Brother    Heart failure Brother    Lung disease Brother    Diabetes Brother     Social  History   Tobacco Use   Smoking status: Former    Types: Cigars   Smokeless tobacco: Never   Tobacco comments:    quit 1990  Vaping Use   Vaping Use: Never used  Substance Use Topics   Alcohol use: Yes    Comment: NOT ON A REGULAR BASIS   Drug use: No    Home Medications Prior to Admission medications   Medication Sig Start Date End Date Taking? Authorizing Provider  acetaminophen (TYLENOL) 325 MG tablet Take 2 tablets (650 mg total) by mouth every 4 (four) hours as needed for mild pain. 02/25/16   R[place]ock, Estill Bakes, NP  amoxicillin (AMOXIL) 500 MG capsule Take 4 capsules by mouth as needed 1 hour prior to dental procedures. 02/20/17   [provider]  aspirin EC 81 MG tablet Take 81 mg by mouth daily.    [provider]  lisinopril-hydrochlorothiazide (ZESTORETIC) 10-12.5 MG  tablet TAKE ONE TABLET EVERY DAY 01/28/21   Evans Lance, MD  nitroGLYCERIN (NITROSTAT) 0.4 MG SL tablet Place 0.4 mg under the tongue every 5 (five) minutes as needed for chest pain. (MAXIMUM of 3 TABLETS)    [provider]  OVER THE COUNTER MEDICATION Take 2 capsules by mouth daily. OTC [place] capsules    [provider]  OVER THE COUNTER MEDICATION Take 1 capsule by mouth daily. OMEGA Q PLUS    [provider]  Red Yeast Rice 600 MG CAPS Take 1 capsule by mouth daily.     [provider]  temazepam (RESTORIL) 30 MG capsule Take 30 mg by mouth at bedtime.    [provider]  Throat Lozenges [facility] FRIEND MT) Use as directed 1 lozenge in the mouth or throat 2 (two) times daily as needed (congestion).    [provider]    Allergies    Vitamin d analogs, Fish oil, Ambien [zolpidem tartrate], and Honey  Review of Systems   Review of Systems  Respiratory:  Positive for shortness of breath.   All other systems reviewed and are negative.  Physical Exam Updated Vital Signs Pulse 69   Temp 97.9 F (36.6 C) (Oral)   Resp (!) 32   SpO2 90%   Physical Exam Vitals and nursing note reviewed.  Constitutional:      Comments: Tachypneic  HENT:     Head: Normocephalic.     Nose: Nose normal.     Mouth/Throat:     Mouth: Mucous membranes are dry.  Eyes:     Extraocular Movements: Extraocular movements intact.     Pupils: Pupils are equal, round, and reactive to light.  Cardiovascular:     Rate and Rhythm: Normal rate and regular rhythm.     Pulses: Normal pulses.  Pulmonary:     Comments: Tachypneic and diminished breath sounds throughout.  Patient has mild diffuse wheezing as well Abdominal:     General: Abdomen is flat.     Palpations: Abdomen is soft.  Musculoskeletal:        General: Normal range of motion.     Cervical back: Normal range of motion and neck supple.  Skin:    General: Skin is warm.     Capillary  Refill: Capillary refill takes less than 2 seconds.  Neurological:     General: No focal deficit present.     Mental Status: He is alert and oriented to person, place, and time.  Psychiatric:        Mood and Affect: Mood  normal.        Behavior: Behavior normal.    ED Results / Procedures / Treatments   Labs (all labs ordered are listed, but only abnormal results are displayed) Labs Reviewed  CULTURE, BLOOD (ROUTINE X 2)  CULTURE, BLOOD (ROUTINE X 2)  RESP PANEL BY RT-PCR (FLU A&B, COVID) ARPGX2  CBC WITH DIFFERENTIAL/PLATELET  COMPREHENSIVE METABOLIC PANEL  BRAIN NATRIURETIC PEPTIDE  LACTIC ACID, PLASMA  LACTIC ACID, PLASMA  BLOOD GAS, ARTERIAL  I-STAT CHEM 8, ED  TROPONIN I (HIGH SENSITIVITY)    EKG EKG Interpretation  Date/Time:  Sunday July 25 2021 19:26:23 EDT Ventricular Rate:  72 PR Interval:    QRS Duration: 143 QT Interval:  411 QTC Calculation: 450 R Axis:   51 Text Interpretation: Atrial fibrillation IVCD, consider atypical LBBB No significant change since last tracing Confirmed by Wandra Arthurs [phone]) on 07/25/2021 7:35:39 PM  Radiology No results found.  Procedures Procedures   CRITICAL CARE Performed by: Wandra Arthurs   Total critical care time: 30 minutes  Critical care time was exclusive of separately billable procedures and treating other patients.  Critical care was necessary to treat or prevent imminent or life-threatening deterioration.  Critical care was time spent personally by me on the following activities: development of treatment plan with patient and/or surrogate as well as nursing, discussions with consultants, evaluation of patient's response to treatment, examination of patient, obtaining history from patient or surrogate, ordering and performing treatments and interventions, ordering and review of laboratory studies, ordering and review of radiographic studies, pulse oximetry and re-evaluation of patient's  condition.   Medications Ordered in ED Medications  albuterol (PROVENTIL) (2.5 MG/3ML) 0.083% nebulizer solution (has no administration in time range)  methylPREDNISolone sodium succinate (SOLU-MEDROL) 125 mg/2 mL injection 125 mg (125 mg Intravenous Given 07/25/21 1931)  albuterol (PROVENTIL) (2.5 MG/3ML) 0.083% nebulizer solution 5 mg (5 mg Nebulization Given 07/25/21 1930)  ipratropium (ATROVENT) nebulizer solution 1 mg (1 mg Nebulization Given 07/25/21 1929)    ED Course  I have reviewed the triage vital signs and the nursing notes.  Pertinent labs & imaging results that were available during my care of the patient were reviewed by me and considered in my medical decision making (see chart for details).    MDM Rules/Calculators/A&P                           CHANLER MENDONCA is a 87 y.o. male here presenting with shortness of breath.  Patient has worsening of his chronic shortness of breath.  Patient appears very tachypneic.  Patient also has mild diffuse wheezing and poor air movement.  Consider CHF versus COPD versus pneumonia versus PE versus COVID.  Plan to get CBC and CMP and BNP and chest x-ray and COVID test and CTA chest.  8:00 PM ABG showed pH 7.22 and CO2 of 55.  Patient is tachypneic despite nebs and steroids.  At this point we will put on BiPAP  9:30 pm  Patient felt much better now.  He is off of BiPAP and is on 3 L nasal cannula.  Chest x-ray showed no obvious pneumonia.  BNP elevated at 700.  Elevated lactate likely from poor perfusion and there is no obvious signs of sepsis so we will hold off on antibiotic  10:23 PM CTA showed no PE.  Patient is stable on 3 L nasal cannula.  At this point will admit for CHF and COPD with  hypoxia.  Final Clinical Impression(s) / ED Diagnoses Final diagnoses:  None    Rx / DC Orders ED Discharge Orders     None        Drenda Freeze, MD 07/25/21 2224

## 2021-07-25 NOTE — ED Triage Notes (Signed)
Pt arrived with [facility] for sob. Denise chest pain. Pt arrived diaphoretic, labored, room air sat 84% which increased to 97% on 2L

## 2021-07-25 NOTE — H&P (Signed)
History and Physical    PLEASE NOTE THAT DRAGON DICTATION SOFTWARE WAS USED IN THE CONSTRUCTION OF THIS NOTE.   Brandon Robles VVO:[id number] DOB: 09-16-1926 DOA: 07/25/2021  PCP: Lavone Orn, MD Patient coming from: home   I have personally briefly reviewed patient's old medical records in [place]  Chief Complaint: Shortness of breath  HPI: Brandon Robles is a 87 y.o. male with medical history significant for coronary artery disease status post stent to RCA in 2001, stent to proximal LAD in 2007, high-grade AV block, chronic diastolic heart failure, hypertension, CKD 3 AAA with baseline creatinine 1.3-1.5, who is admitted to [facility] on 07/25/2021 with acute Evoxac respiratory failure in the setting of acute on chronic diastolic heart failure after presenting from home to [facility] ED complaining of shortness of breath.  The patient reports 2 days of progressive shortness of breath associated with rhinitis, rhinorrhea, postnasal drip, and nonproductive cough in the absence of hemoptysis and denies sore throat.  He denies any associated orthopnea, PND, or new onset peripheral edema.  Not associate with any recent chest pain, diaphoresis, palpitations, nausea, vomiting, presyncope, or syncope.  No recent wheezing, new lower extremity erythema, or calf tenderness.Denies any recent trauma, travel, surgical procedures, or periods of prolonged diminished ambulatory status. No recent melena or hematochezia.   Denies any associated subjective fever, chills, rigors, or generalized myalgias.  No associated headache, neck stiffness, rash, abdominal pain, or diarrhea.  He also denies any acute dysuria, gross hematuria, or change in urinary urgency/frequency.  The patient states that he is a lifelong non-smoker, denies any baseline supplemental oxygen requirements.  He also specifically denies any known history of COPD or any additional chronic pulmonary pathology.  He has a history of chronic  diastolic heart failure, although I have not been able to locate records of prior echocardiogram per initial chart review.  He is not on any scheduled diuretic medications as an outpatient with the exception of HCTZ in the setting of essential hypertension.  He has a history of high-grade heart block for which he follows with Dr. Lovena Le of cardiology.  In reviewing Dr. Tanna Furry most recent note from January 2022, it appears that the patient has a long history of high-grade heart block but has not met any indications for pacemaker.  He also has a history of coronary artery disease status post PCI with stent to the RCA in 2001 as well as PCI with stent placement to the proximal LAD in 2007.  In the context of his history of chronic diastolic heart failure, patient reports that he routinely monitors his pulse ox at home, noting that his typical pulse ox runs in the high 90s.  However, in the setting of progressive shortness of breath over the course of the last 2 days, the patient noted his oxygen saturation to decrease relative to this baseline into the high 80s, prompting him to be brought to the [facility] emergency department for further evaluation thereof.   ED Course:  Vital signs in the ED were notable for the following: Afebrile; heart rate 56-72; blood pressure 104/70 -129/62; respiratory rate initially 31-32, before subsequently improving into the range of 18-20 following pharmacologic interventions as found below; initial oxygen saturation noted to be 87% on room air.  Patient was initially put on BiPAP, however was separately taken off around2130 on 07/25/2021, subsequently been satting in the high 90s on 3 to 4 L nasal cannula.  Labs were notable for the following: CMP  notable for the following: Sodium 130, potassium 4.8, bicarbonate 18, anion gap 16, creatinine 1.52 relative to baseline creatinine range of 1.3-1.5, glucose 248, and liver enzymes were found to be within normal limits.  Initial  high-sensitivity troponin I 32.  CBC notable for white cell count 12,200 with 61% neutrophils, hemoglobin 12.6.  Initial lactate 3.5, with repeat value trending down to 2.6.  Blood cultures x2 collected in the ED.  COVID-19/influenza PCR were checked in the ED today and found to be negative.  I-STAT abg notable for 7.223/55.   Imaging and additional notable ED work-up: EKG is associated with some artifact limiting interpretation, but within these confines, appears to demonstrate known high-grade AV block, known left bundle branch block, ventricular rate 72, without acute T wave or ST changes.  Chest x-ray showed mild pulmonary vascular congestion with small bilateral pleural effusions without evidence of infiltrate or pneumothorax.  CTA chest showed no evidence of acute pulmonary embolism will showing evidence of interstitial edema, small bilateral pleural effusions, but no evidence of infiltrate or pneumothorax.  While in the ED, the following were administered: Lasix 40 mg IV x1, solumedrol 125 mg IV x1, duo nebulizer treatment.  Socially, the patient was admitted to the PCU for further evaluation management of acute hypoxic respiratory failure in the setting of acute on chronic diastolic heart failure.     Review of Systems: As per HPI otherwise 10 point review of systems negative.   Past Medical History:  Diagnosis Date   Blastocystis hominis    Bradycardia 07/18/2013   CAD (coronary artery disease)    S/P inferior MI 07/2000 with RCA stenting at that time. In 03/2006 he had a LAD non-drug-eluting stent implanted.   Chronic insomnia    Complex renal cyst    Left   Diverticulitis    ED (erectile dysfunction)    Vacuum pump   Essential hypertension, benign    Stable, on lisinopril-HCTZ & Atenolol.   GERD (gastroesophageal reflux disease)    H/O pericarditis    Hemorrhoids    LBBB (left bundle branch block)    Multiple facial fractures ([place]) 02/25/2016   Multiple rib fractures 09/25/10    PTX 09/2010   Nephrolithiasis    Renal stone basketing.   Pure hypercholesterolemia    On Red Yeast Rice & CoQ 10.   Sinoatrial node dysfunction (HCC)    EC Holter 24hr 06/24/13 showed sinus bradycardia with probable chronotropic incompetence. May need pacemaker per Dr. Daneen Schick.   Skin cancer    Left base (Face) & (Ear in 12/2011).    Past Surgical History:  Procedure Laterality Date   CORONARY ANGIOPLASTY WITH STENT PLACEMENT  07/25/00   MI in 07/25/00 with RCA stenting at that time.   CORONARY ANGIOPLASTY WITH STENT PLACEMENT  04/14/06   Bare metal stent in proximal LAD [facility] - 3.5 x16 stent) by Dr. Linard Millers.   PROSTATE BIOPSY     Evidence   RIGHT THORACOTOMY,OPEN REDUCTION AND INTERNAL FIXATION OF RIBS 4-7  09/25/2010   STONE EXTRACTION WITH BASKET     Renal    Social History:  reports that he has quit smoking. His smoking use included cigars. He has never used smokeless tobacco. He reports current alcohol use. He reports that he does not use drugs.   Allergies  Allergen Reactions   Vitamin D Analogs Other (See Comments)    "pins and needles feeling"   Fish Oil Other (See Comments)   Ambien [Zolpidem Tartrate]  Other (See Comments)    Confusion   Honey Other (See Comments)    "pins and needles feeling"    Family History  Problem Relation Age of Onset   CVA Mother    Heart attack Father    Kidney failure Father    Heart disease Sister        Possible heart disease   Aneurysm Sister    Hypertension Brother    Heart disease Brother    Heart failure Brother    Lung disease Brother    Diabetes Brother     Family history reviewed and not pertinent    Prior to Admission medications   Medication Sig Start Date End Date Taking? Authorizing Provider  acetaminophen (TYLENOL) 325 MG tablet Take 2 tablets (650 mg total) by mouth every 4 (four) hours as needed for mild pain. 02/25/16   R[place], Estill Bakes, NP  amoxicillin (AMOXIL) 500 MG capsule Take 4  capsules by mouth as needed 1 hour prior to dental procedures. 02/20/17   [provider]  aspirin EC 81 MG tablet Take 81 mg by mouth daily.    [provider]  lisinopril-hydrochlorothiazide (ZESTORETIC) 10-12.5 MG tablet TAKE ONE TABLET EVERY DAY 01/28/21   Evans Lance, MD  nitroGLYCERIN (NITROSTAT) 0.4 MG SL tablet Place 0.4 mg under the tongue every 5 (five) minutes as needed for chest pain. (MAXIMUM of 3 TABLETS)    [provider]  OVER THE COUNTER MEDICATION Take 2 capsules by mouth daily. OTC [place] capsules    [provider]  OVER THE COUNTER MEDICATION Take 1 capsule by mouth daily. OMEGA Q PLUS    [provider]  Red Yeast Rice 600 MG CAPS Take 1 capsule by mouth daily.     [provider]  temazepam (RESTORIL) 30 MG capsule Take 30 mg by mouth at bedtime.    [provider]  Throat Lozenges [facility] FRIEND [place]) Use as directed 1 lozenge in the mouth or throat 2 (two) times daily as needed (congestion).    [provider]     Objective    Physical Exam: Vitals:   07/25/21 2000 07/25/21 2015 07/25/21 2030 07/25/21 2200  BP: 129/62 116/75 (!) 116/56 (!) 106/54  Pulse: 66 63 67 (!) 58  Resp: (!) 27 (!) 25 (!) 23 19  Temp:      TempSrc:      SpO2: 100% 100% 100% 99%    General: appears to be stated age; alert, oriented Skin: warm, dry, no rash Head:  AT/[place] Mouth:  Oral mucosa membranes appear moist, normal dentition Neck: supple; trachea midline Heart:  RRR; did not appreciate any M/R/G Lungs: Bibasilar rales noted, but otherwise CTAB, , did not appreciate any wheezes,  or rhonchi Abdomen: + BS; soft, ND, NT Vascular: 2+ pedal pulses b/l; 2+ radial pulses b/l Extremities: 1-2+ edema in the bilateral lower extremities,  no muscle wasting Neuro: strength and sensation intact in upper and lower extremities b/l   Labs on Admission: I have personally reviewed following labs and imaging  studies  CBC: Recent Labs  Lab 07/25/21 1922 07/25/21 1943 07/25/21 1946  WBC 12.2*  --   --   NEUTROABS 7.6  --   --   HGB 12.6* 12.9* 13.6  HCT 40.0 38.0* 40.0  MCV 103.9*  --   --   PLT 259  --   --    Basic Metabolic Panel: Recent Labs  Lab 07/25/21 1922 07/25/21 1943 07/25/21 1946  NA 138 140 139  K 4.8 3.9 4.8  CL 104  --  108  CO2 18*  --   --   GLUCOSE 248*  --  249*  BUN 27*  --  35*  CREATININE 1.52*  --  1.40*  CALCIUM 8.9  --   --    GFR: CrCl cannot be calculated (Unknown ideal weight.). Liver Function Tests: Recent Labs  Lab 07/25/21 1922  AST 34  ALT 22  ALKPHOS 43  BILITOT 1.0  PROT 6.5  ALBUMIN 4.0   No results for input(s): LIPASE, AMYLASE in the last 168 hours. No results for input(s): AMMONIA in the last 168 hours. Coagulation Profile: No results for input(s): INR, PROTIME in the last 168 hours. Cardiac Enzymes: No results for input(s): CKTOTAL, CKMB, CKMBINDEX, TROPONINI in the last 168 hours. BNP (last 3 results) No results for input(s): PROBNP in the last 8760 hours. HbA1C: No results for input(s): HGBA1C in the last 72 hours. CBG: No results for input(s): GLUCAP in the last 168 hours. Lipid Profile: No results for input(s): CHOL, HDL, LDLCALC, TRIG, CHOLHDL, LDLDIRECT in the last 72 hours. Thyroid Function Tests: No results for input(s): TSH, T4TOTAL, FREET4, T3FREE, THYROIDAB in the last 72 hours. Anemia Panel: No results for input(s): VITAMINB12, FOLATE, FERRITIN, TIBC, IRON, RETICCTPCT in the last 72 hours. Urine analysis:    Component Value Date/Time   COLORURINE YELLOW 09/10/2016 2041   APPEARANCEUR CLEAR 09/10/2016 2041   LABSPEC 1.019 09/10/2016 2041   PHURINE 5.0 09/10/2016 2041   GLUCOSEU NEGATIVE 09/10/2016 2041   HGBUR NEGATIVE 09/10/2016 2041   [place] NEGATIVE 09/10/2016 2041   [place] NEGATIVE 09/10/2016 2041   PROTEINUR NEGATIVE 09/10/2016 2041   NITRITE NEGATIVE 09/10/2016 2041   LEUKOCYTESUR  NEGATIVE 09/10/2016 2041    Radiological Exams on Admission: CT Angio Chest PE W and/or Wo Contrast  Result Date: 07/25/2021 CLINICAL DATA:  PE suspected, high prob.  Dyspnea. EXAM: CT ANGIOGRAPHY CHEST WITH CONTRAST TECHNIQUE: Multidetector CT imaging of the chest was performed using the standard protocol during bolus administration of intravenous contrast. Multiplanar CT image reconstructions and MIPs were obtained to evaluate the vascular anatomy. CONTRAST:  76m OMNIPAQUE IOHEXOL 350 MG/ML SOLN COMPARISON:  None. FINDINGS: Cardiovascular: There is adequate opacification the pulmonary arterial tree. No intraluminal filling defect identified to suggest acute pulmonary embolism. The central pulmonary arteries are of normal caliber. Moderate multi-vessel coronary artery calcification. Mild global cardiomegaly. No pericardial effusion. Mild atherosclerotic calcification within the thoracic aorta. No aortic aneurysm. Mediastinum/Nodes: No enlarged mediastinal, hilar, or axillary lymph nodes. Thyroid gland, trachea, and esophagus demonstrate no significant findings. Lungs/Pleura: Small bilateral pleural effusions are present. Mild bibasilar smooth interlobular septal thickening is in keeping with trace interstitial pulmonary edema. Mild bibasilar atelectasis. No pneumothorax. Central airways are widely patent. Upper Abdomen: No acute abnormality. Musculoskeletal: No acute bone abnormality. Multiple healed and internally fixed right rib fractures noted. No acute bone abnormality. No lytic or blastic bone lesion. Accentuated thoracic kyphosis noted. Review of the MIP images confirms the above findings. IMPRESSION: No pulmonary embolism. Trace interstitial pulmonary edema and small bilateral pleural effusions, possibly reflecting changes of mild cardiogenic failure. Mild global cardiomegaly. Moderate coronary artery calcification. Aortic Atherosclerosis (ICD10-I70.0). Electronically Signed   By: AFidela SalisburyM.D.    On: 07/25/2021 21:55   DG Chest Port 1 View  Result Date: 07/25/2021 CLINICAL DATA:  Shortness of breath. EXAM: PORTABLE CHEST 1 VIEW COMPARISON:  05/13/2018 FINDINGS: Mild cardiac enlargement with mild pulmonary vascular  congestion. Small pleural effusions are suggested. No definite edema or consolidation. Old right rib fractures with postoperative change. Calcification of the aorta. IMPRESSION: Cardiac enlargement with mild vascular congestion and small pleural effusions. Electronically Signed   By: Lucienne Capers M.D.   On: 07/25/2021 20:28     EKG: Independently reviewed, with result as described above.    Assessment/Plan   ; SYNOPSIS / BIG PICTURE: 87 y.o male admitted with acute hypoxic resp failure d/t acute on chronic diastolic HF. Diuresing with IV Lasix. Trending mildly elevated trops, suspected to be d/t Type 2 process. Echo order for AM. Has known h/o of chronic long-term high grade heart block, follows with Dr. Lovena Le. No indication for pacer per last appt with Dr. Lovena Le. May benefits from cards discussion in AM   ;    Principal Problem:   Acute respiratory failure with hypoxia (HCC) Active Problems:   Hypertension   Acute on chronic diastolic CHF (congestive heart failure) (HCC)   Elevated troponin   Metabolic acidosis   Lactic acidosis   Hyperglycemia   CKD (chronic kidney disease) stage 3, GFR 30-59 ml/min (HCC)     #) Acute hypoxic respiratory failure: In the context of no known baseline supplemental oxygen requirements, initial O2 sats in the high 80s on room air, simply improving into the mid to high 90s on 3 to 4 L nasal cannula, thereby meeting criteria for acute Evoxac respiratory failure as opposed to distress.  This appears to be in the basis of acute on chronic diastolic heart failure given imaging evidence of interstitial edema, as further detailed below.  Of note, CTA chest showed no evidence of acute pulmonary embolism also showed no evidence of  infiltrate or pneumothorax. Will further assess bacterial pneumonia by checking procalcitonin, however, clinically the, this possibility appears less likely at the present time.  ACS appears less likely at this time in the absence of any recent chest pain, with initial EKG showing known heart block, without e/o overt interval ischemic changes.  Suspect that mildly elevated initial troponin represents type II supply demand mismatch as a consequence of diminished oxygen delivery capacity due to presenting acute hypoxic respiratory failure as opposed to representing a type I process due to acute plaque rupture.  However, we will continue to trend serial troponin, closely monitor on telemetry, with plan for echo in the AM.  Differential also includes possibility of bronchitis given the patient's report progressive rhinitis/rhinorrhea, postnasal drip and new onset cough.  Its associated wheezing to suggest bronchospasm.  Consequently, will from administration of additional steroids at this time.   Plan: Further evaluation management of acute on chronic diastolic heart failure, as further detailed below, including additional IV diuresis.  Monitor on continuous pulse oximetry.  Monitor telemetry.  Add on serum magnesium and phosphorus levels.  Repeat blood gas.  Add on procalcitonin.  As needed albuterol inhaler.  Repeat CBC in the morning.  Echocardiogram in the morning.  Trend serial troponin.       #) Acute on chronic diastolic heart failure: dx of acute decompensation on the basis of presenting 2 days of progressive shortness of breath with imaging evidence of interval development of interstitial edema. This is in the context of a documented history of chronic diastolic heart failure, although without prior echocardiogram results per my initial chart review. I suspect that mildly elevated initial troponin is a consequence of underlying acutely decompensated heart failure as well as due to diminished oxygen  delivery capacity basis of resultant  acute hypoxic respiratory failure as opposed to representing ACS causing presenting acute heart failure exacerbation, particularly in the absence of any recent CP, and with presenting EKG showing no evidence of acute ischemic changes. However, will continue to evaluate with further trending of troponin and close monitoring on tele.  Of note, patient not on any scheduled loop diuretics at home, nor any beta-blocker.  Of note, patient received Lasix 40 mg IV x1 while in the ED today. Presentation warrants additional IV diuresis, as further detailed below, with close monitoring of ensuing renal function, electrolytes, and volume status. Of note, I utilized the Heart Failure order set to assist with my placement of orders on this patient.    Plan: monitor strict I's & O's and daily weights. Monitor on telemetry . Monitor continuous pulse oximetry.  Lasix 40 mg IV twice daily.  Repeat BMP in the morning, including for monitoring trend of potassium, bicarbonate, and renal function in response to interval diuresis efforts. Add-on serum magnesium level, and repeat this level in the AM. Close monitoring of ensuing blood pressure response to diuresis efforts, including to help guide need for improvement in afterload reduction in order to optimize cardiac output. Trend troponin.  Echocardiogram is been ordered for the morning. Given artifact on initial EKG, will also repeat ekg. In setting of known long-standing high grade AV block following with Dr. Lovena Le, considering discussing case with cards in the AM.       #) Elevated troponin: mildly elevated initial troponin of 32. No prior high sensitivity troponin I value available for point of comparison.  Suspect that this mildly elevated troponin is on the basis of supply demand mismatch in the setting of acute on chronic heart failure as well as decline in oxygen delivery capacity due to acute hypoxic respiratory failure, as above  as opposed to representing a type I process due to acute plaque rupture.  EKG, as above.  No chest pain.  CTA chest shows no evidence of acute pulmonary embolism or any evidence of pneumothorax. Overall, ACS is felt to be less likely relative to type 2 supply demand mismatch, as above, but will closely monitor on telemetry overnight while treating suspected underlying acute on chronic diastolic heart failure and associated acute hypoxic respiratory failure, as further described above.   Plan:continue to trend serial troponin.  Repeat EKG, as above.  Monitor on telemetry. PRN EKG for development of chest pain.  Check serum Mg level and repeat BMP in the morning.  Repeat CBC in the AM. FD asa x 1.  Echocardiogram ordered in the morning.     #) Hyperglycemia: In the absence of any known history of diabetes, in the absence of any prior hemoglobin A1c value, presenting glucose noted to be 248.  Suspect degree of acute hyperglycemia as a consequence of physiologic stress relating to presenting acute on chronic diastolic failure as well as resultant acute hypoxic respiratory failure, as above.  A mild anion gap metabolic acidosis is noted, but not felt to represent DKA, as alternate sources of metabolic acidosis exist at presentation, as further detailed below.   Plan: Check Accu-Cheks every 6 hours without order for associated sliding scale insulin at this time in order to prevent hypoglycemia given that patient is nave to exogenous insulin.  Check hemoglobin A1c.  Repeat CMP in the morning.      #) Combined metabolic/respiratory acidosis: As defined by presenting i-STAT ABG as well as CMP, with anion gap metabolic acidosis felt to be on the  basis of lactic acidosis as a consequence of relative decline in tissue perfusion as a consequence of diminished oxygen delivery capacity as a result of acute hypoxic respiratory failure, as above.  No evidence of underlying infectious process at this time, and SIRS  criteria not met for sepsis.  Check urinalysis to further evaluate for any underlying source of infection, will also adding on Procalcitonin level.  Of note, no known history of underlying obstructive lung disease, patient confirming he is a lifelong non-smoker. It is noted that the initial ABG was iSTAT in nature, and will reassess with repeat ABG at this time.  Of note, lactic acidosis improving with improving oxygenation, as above.   Plan: Check ABG now, as above.  Repeat lactic acid level tomorrow morning.  Add on salicylate level.  Check urinalysis.  Add on procalcitonin level.  Repeat CMP and CBC in the morning.  Trending of subsequent blood sugars per every 6 hours Accu-Cheks, as above.      #) Essential hypertension: Documented history of such, with outpatient hypertensive regimen consisting of lisinopril as well as HCTZ.  Blood pressures noted to be normotensive in the ED thus far, including with initial diuresis efforts.  Plan: We will continue home lisinopril, including for afterload reduction optimization in the setting of presenting acute on chronic diastolic heart failure.  Hold home HCTZ for now.  Close monitoring with some blood pressure via routine vital signs.  Monitor strict I's and O's and weights.  Repeat CMP in the morning.      #) Stage IIIa chronic kidney disease: Associated with baseline creatinine range of 1.3-1.5, with presenting serum creatinine found to be consistent with this range.  Plan: Monitor strict I's and O's Daily weights.  Attempt to avoid nephrotoxic agents.  Repeat BMP in the morning.     DVT prophylaxis: SCD's   Code Status: DNR (DNR paperwork included) Family Communication: none Disposition Plan: Per Rounding Team Consults called: none;  Admission status: inpatient; pcu   Of note, this patient was added by me to the following Admit List/Treatment Team:  mcadmits.   Of note, the Adult Admission Order Set (Multimorbid order set) was used by me  in the admission process for this patient.  PLEASE NOTE THAT DRAGON DICTATION SOFTWARE WAS USED IN THE CONSTRUCTION OF THIS NOTE.   Rhetta Mura DO [facility] Pager [phone] From [place]   07/25/2021, 10:33 PM

## 2021-07-26 ENCOUNTER — Other Ambulatory Visit: Payer: Self-pay

## 2021-07-26 ENCOUNTER — Inpatient Hospital Stay (HOSPITAL_COMMUNITY): Payer: Medicare Other

## 2021-07-26 DIAGNOSIS — N183 Chronic kidney disease, stage 3 unspecified: Secondary | ICD-10-CM | POA: Diagnosis present

## 2021-07-26 DIAGNOSIS — I5021 Acute systolic (congestive) heart failure: Secondary | ICD-10-CM

## 2021-07-26 DIAGNOSIS — R778 Other specified abnormalities of plasma proteins: Secondary | ICD-10-CM

## 2021-07-26 DIAGNOSIS — R739 Hyperglycemia, unspecified: Secondary | ICD-10-CM | POA: Diagnosis present

## 2021-07-26 DIAGNOSIS — E872 Acidosis, unspecified: Secondary | ICD-10-CM | POA: Diagnosis present

## 2021-07-26 DIAGNOSIS — J9601 Acute respiratory failure with hypoxia: Secondary | ICD-10-CM

## 2021-07-26 DIAGNOSIS — I5033 Acute on chronic diastolic (congestive) heart failure: Secondary | ICD-10-CM

## 2021-07-26 LAB — COMPREHENSIVE METABOLIC PANEL
ALT: 22 U/L (ref 0–44)
AST: 31 U/L (ref 15–41)
Albumin: 3.5 g/dL (ref 3.5–5.0)
Alkaline Phosphatase: 32 U/L — ABNORMAL LOW (ref 38–126)
Anion gap: 10 (ref 5–15)
BUN: 29 mg/dL — ABNORMAL HIGH (ref 8–23)
CO2: 23 mmol/L (ref 22–32)
Calcium: 8.8 mg/dL — ABNORMAL LOW (ref 8.9–10.3)
Chloride: 106 mmol/L (ref 98–111)
Creatinine, Ser: 1.64 mg/dL — ABNORMAL HIGH (ref 0.61–1.24)
GFR, Estimated: 39 mL/min — ABNORMAL LOW (ref >60)
Glucose, Bld: 185 mg/dL — ABNORMAL HIGH (ref 70–99)
Potassium: 4.3 mmol/L (ref 3.5–5.1)
Sodium: 139 mmol/L (ref 135–145)
Total Bilirubin: 1.2 mg/dL (ref 0.3–1.2)
Total Protein: 6.1 g/dL — ABNORMAL LOW (ref 6.5–8.1)

## 2021-07-26 LAB — CBG MONITORING, ED
Glucose-Capillary: 188 mg/dL — ABNORMAL HIGH (ref 70–99)
Glucose-Capillary: 143 mg/dL — ABNORMAL HIGH (ref 70–99)

## 2021-07-26 LAB — CBC WITH DIFFERENTIAL/PLATELET
Abs Immature Granulocytes: 0.04 10*3/uL (ref 0.00–0.07)
Basophils Absolute: 0 10*3/uL (ref 0.0–0.1)
Basophils Relative: 0 %
Eosinophils Absolute: 0 10*3/uL (ref 0.0–0.5)
Eosinophils Relative: 0 %
HCT: 35.2 % — ABNORMAL LOW (ref 39.0–52.0)
Hemoglobin: 11.7 g/dL — ABNORMAL LOW (ref 13.0–17.0)
Immature Granulocytes: 1 %
Lymphocytes Relative: 6 %
Lymphs Abs: 0.4 10*3/uL — ABNORMAL LOW (ref 0.7–4.0)
MCH: 33.6 pg (ref 26.0–34.0)
MCHC: 33.2 g/dL (ref 30.0–36.0)
MCV: 101.1 fL — ABNORMAL HIGH (ref 80.0–100.0)
Monocytes Absolute: 0.1 10*3/uL (ref 0.1–1.0)
Monocytes Relative: 2 %
Neutro Abs: 7.1 10*3/uL (ref 1.7–7.7)
Neutrophils Relative %: 91 %
Platelets: 196 10*3/uL (ref 150–400)
RBC: 3.48 MIL/uL — ABNORMAL LOW (ref 4.22–5.81)
RDW: 14.6 % (ref 11.5–15.5)
WBC: 7.7 10*3/uL (ref 4.0–10.5)
nRBC: 0 % (ref 0.0–0.2)

## 2021-07-26 LAB — URINALYSIS, COMPLETE (UACMP) WITH MICROSCOPIC
Bacteria, UA: NONE SEEN
Bilirubin Urine: NEGATIVE
Glucose, UA: NEGATIVE mg/dL
Hgb urine dipstick: NEGATIVE
Ketones, ur: NEGATIVE mg/dL
Leukocytes,Ua: NEGATIVE
Nitrite: NEGATIVE
Protein, ur: NEGATIVE mg/dL
Specific Gravity, Urine: 1.02 (ref 1.005–1.030)
pH: 5 (ref 5.0–8.0)

## 2021-07-26 LAB — ECHOCARDIOGRAM COMPLETE
AR max vel: 2.16 cm2
AV Area VTI: 1.8 cm2
AV Area mean vel: 1.98 cm2
AV Mean grad: 4 mmHg
AV Peak grad: 8.1 mmHg
Ao pk vel: 1.42 m/s
Calc EF: 22.4 %
Height: 67 in
P 1/2 time: 545 msec
S' Lateral: 3.6 cm
Single Plane A2C EF: 34.9 %
Single Plane A4C EF: 27.8 %
Weight: 2638.47 oz

## 2021-07-26 LAB — I-STAT ARTERIAL BLOOD GAS, ED
Acid-base deficit: 2 mmol/L (ref 0.0–2.0)
Bicarbonate: 22.9 mmol/L (ref 20.0–28.0)
Calcium, Ion: 1.17 mmol/L (ref 1.15–1.40)
HCT: 37 % — ABNORMAL LOW (ref 39.0–52.0)
Hemoglobin: 12.6 g/dL — ABNORMAL LOW (ref 13.0–17.0)
O2 Saturation: 96 %
Patient temperature: 98.6
Potassium: 3.7 mmol/L (ref 3.5–5.1)
Sodium: 139 mmol/L (ref 135–145)
TCO2: 24 mmol/L (ref 22–32)
pCO2 arterial: 37.2 mmHg (ref 32.0–48.0)
pH, Arterial: 7.396 (ref 7.350–7.450)
pO2, Arterial: 83 mmHg (ref 83.0–108.0)

## 2021-07-26 LAB — HEMOGLOBIN A1C
Hgb A1c MFr Bld: 5.4 % (ref 4.8–5.6)
Mean Plasma Glucose: 108.28 mg/dL

## 2021-07-26 LAB — PROCALCITONIN: Procalcitonin: <0.1 ng/mL

## 2021-07-26 LAB — MAGNESIUM: Magnesium: 1.9 mg/dL (ref 1.7–2.4)

## 2021-07-26 LAB — TROPONIN I (HIGH SENSITIVITY): Troponin I (High Sensitivity): 456 ng/L (ref <18)

## 2021-07-26 LAB — PHOSPHORUS: Phosphorus: 3.6 mg/dL (ref 2.5–4.6)

## 2021-07-26 LAB — GLUCOSE, CAPILLARY
Glucose-Capillary: 120 mg/dL — ABNORMAL HIGH (ref 70–99)
Glucose-Capillary: 134 mg/dL — ABNORMAL HIGH (ref 70–99)

## 2021-07-26 LAB — LACTIC ACID, PLASMA: Lactic Acid, Venous: 1.8 mmol/L (ref 0.5–1.9)

## 2021-07-26 MED ORDER — LISINOPRIL 10 MG PO TABS
10.0000 mg | ORAL_TABLET | Freq: Every day | ORAL | Status: DC
  Administered 2021-07-26: 10 mg via ORAL
  Filled 2021-07-26: qty 1

## 2021-07-26 MED ORDER — PERFLUTREN LIPID MICROSPHERE
1.0000 mL | INTRAVENOUS | Status: AC | PRN
  Administered 2021-07-26: 2 mL via INTRAVENOUS
  Filled 2021-07-26: qty 10

## 2021-07-26 MED ORDER — FUROSEMIDE 10 MG/ML IJ SOLN
20.0000 mg | Freq: Two times a day (BID) | INTRAMUSCULAR | Status: DC
  Administered 2021-07-27 – 2021-07-28 (×3): 20 mg via INTRAVENOUS
  Filled 2021-07-26 (×3): qty 2

## 2021-07-26 MED ORDER — GUAIFENESIN ER 600 MG PO TB12
600.0000 mg | ORAL_TABLET | Freq: Two times a day (BID) | ORAL | Status: DC
  Administered 2021-07-26 – 2021-07-28 (×4): 600 mg via ORAL
  Filled 2021-07-26 (×4): qty 1

## 2021-07-26 MED ORDER — SALINE SPRAY 0.65 % NA SOLN
1.0000 | NASAL | Status: DC | PRN
  Administered 2021-07-26: 1 via NASAL
  Filled 2021-07-26: qty 44

## 2021-07-26 NOTE — ED Notes (Signed)
Breakfast order placed ?

## 2021-07-26 NOTE — ED Notes (Signed)
Pt stated his heart is usually low. He says he was told that when he sleeps it can drop down to the 30's. HR 51 will notify doctor.

## 2021-07-26 NOTE — Progress Notes (Signed)
Progress Note    Brandon Robles  BSJ:[id number] DOB: [birth year]  DOA: 07/25/2021 PCP: Lavone Orn, MD    Brief Narrative:     Medical records reviewed and are as summarized below:  Brandon Robles is an 87 y.o. male admitted with acute hypoxic resp failure d/t acute on chronic diastolic HF. Diuresing with IV Lasix. Trending mildly elevated trops, suspected to be d/t Type 2 process. Echo order for AM. Has known h/o of chronic long-term high grade heart block, follows with Dr. Lovena Le. No indication for pacer per last appt with Dr. Lovena Le.   Assessment/Plan:   Principal Problem:   Acute respiratory failure with hypoxia (HCC) Active Problems:   Hypertension   Acute on chronic diastolic CHF (congestive heart failure) (HCC)   Elevated troponin   Metabolic acidosis   Lactic acidosis   Hyperglycemia   CKD (chronic kidney disease) stage 3, GFR 30-59 ml/min (HCC)    Acute hypoxic respiratory failure:  - initial O2 sats in the high 80s on room air, simply improving into the mid to high 90s on 3 to 4 L nasal cannula, -CTA chest showed no evidence of acute pulmonary embolism also showed no evidence of infiltrate or pneumothorax.  -wean off O2 as able   Acute on chronic diastolic heart failure:  -dx of acute decompensation on the basis of presenting 2 days of progressive shortness of breath with imaging evidence of interval development of interstitial edema.  - strict I's & O's and daily weights.  -Monitor on telemetry .  - Lasix 40 mg IV twice daily.  -echo pending -cards consult    Elevated troponin:  -no chest pain -trending up -? Demand ischemia -cards consult   Hyperglycemia:  -In the absence of any known history of diabetes -hemoglobin A1c: 5.4  Essential hypertension:  - will continue home lisinopril, including for afterload reduction optimization in the setting of presenting acute on chronic diastolic heart failure.  - Hold home HCTZ for now.   -IV lasix   Stage  IIIa chronic kidney disease -baseline creatinine range of 1.3-1.5, with presenting serum creatinine found to be consistent with this range. -Monitor strict I's and O's Daily weights.  -avoid nephrotoxic agents.   -Repeat BMP in the morning.       Family Communication/Anticipated D/C date and plan/Code Status   DVT prophylaxis: scd Code Status: DNR Disposition Plan: Status is: Inpatient  Remains inpatient appropriate because:Inpatient level of care appropriate due to severity of illness  Dispo: The patient is from: Home              Anticipated d/c is to: Home              Patient currently is not medically stable to d/c.   Difficult to place patient No         Medical Consultants:   cards    Subjective:   Would like to go home Denies chest pain but thinks perhaps he got too excited during a [employer] race yesterday  Objective:    Vitals:   07/26/21 0952 07/26/21 0953 07/26/21 1000 07/26/21 1006  BP:  108/60 (!) 112/55   Pulse: (!) 41 (!) 48 (!) 45   Resp:  18 14   Temp:      TempSrc: Oral     SpO2:  98% 98% 98%  Weight:      Height:        Intake/Output Summary (Last 24 hours) at 07/26/2021 1016  Last data filed at 07/26/2021 1006 Gross per 24 hour  Intake --  Output 200 ml  Net -200 ml   Filed Weights   07/26/21 0726  Weight: 74.8 kg    Exam:  General: Appearance:     Overweight male in no acute distress     Lungs:     respirations unlabored, diminished  Heart:    Bradycardic.   MS:   All extremities are intact.    Neurologic:   Awake, alert, oriented x 3.     Data Reviewed:   I have personally reviewed following labs and imaging studies:  Labs: Labs show the following:   Basic Metabolic Panel: Recent Labs  Lab 07/25/21 1922 07/25/21 1943 07/25/21 1946 07/25/21 2309 07/26/21 0024 07/26/21 0348  NA 138 140 139  --  139 139  K 4.8 3.9 4.8  --  3.7 4.3  CL 104  --  108  --   --  106  CO2 18*  --   --   --   --  23  GLUCOSE  248*  --  249*  --   --  185*  BUN 27*  --  35*  --   --  29*  CREATININE 1.52*  --  1.40*  --   --  1.64*  CALCIUM 8.9  --   --   --   --  8.8*  MG  --   --   --  2.0  --  1.9  PHOS  --   --   --   --   --  3.6   GFR Estimated Creatinine Clearance: 26.9 mL/min (A) (by C-G formula based on SCr of 1.64 mg/dL (H)). Liver Function Tests: Recent Labs  Lab 07/25/21 1922 07/26/21 0348  AST 34 31  ALT 22 22  ALKPHOS 43 32*  BILITOT 1.0 1.2  PROT 6.5 6.1*  ALBUMIN 4.0 3.5   No results for input(s): LIPASE, AMYLASE in the last 168 hours. No results for input(s): AMMONIA in the last 168 hours. Coagulation profile No results for input(s): INR, PROTIME in the last 168 hours.  CBC: Recent Labs  Lab 07/25/21 1922 07/25/21 1943 07/25/21 1946 07/26/21 0024 07/26/21 0348  WBC 12.2*  --   --   --  7.7  NEUTROABS 7.6  --   --   --  7.1  HGB 12.6* 12.9* 13.6 12.6* 11.7*  HCT 40.0 38.0* 40.0 37.0* 35.2*  MCV 103.9*  --   --   --  101.1*  PLT 259  --   --   --  196   Cardiac Enzymes: No results for input(s): CKTOTAL, CKMB, CKMBINDEX, TROPONINI in the last 168 hours. BNP (last 3 results) No results for input(s): PROBNP in the last 8760 hours. CBG: Recent Labs  Lab 07/26/21 0909  GLUCAP 188*   D-Dimer: No results for input(s): DDIMER in the last 72 hours. Hgb A1c: Recent Labs    07/26/21 0348  HGBA1C 5.4   Lipid Profile: No results for input(s): CHOL, HDL, LDLCALC, TRIG, CHOLHDL, LDLDIRECT in the last 72 hours. Thyroid function studies: No results for input(s): TSH, T4TOTAL, T3FREE, THYROIDAB in the last 72 hours.  Invalid input(s): FREET3 Anemia work up: No results for input(s): VITAMINB12, FOLATE, FERRITIN, TIBC, IRON, RETICCTPCT in the last 72 hours. Sepsis Labs: Recent Labs  Lab 07/25/21 1922 07/25/21 2309 07/25/21 2310 07/26/21 0348  PROCALCITON  --  <0.10  --   --   WBC 12.2*  --   --  7.7  LATICACIDVEN 3.5*  --  2.6* 1.8    Microbiology Recent Results  (from the past 240 hour(s))  Resp Panel by RT-PCR (Flu A&B, Covid)     Status: None   Collection Time: 07/25/21  7:22 PM   Specimen: Nasopharyngeal(NP) swabs in vial transport medium  Result Value Ref Range Status   SARS Coronavirus 2 by RT PCR NEGATIVE NEGATIVE Final    Comment: (NOTE) SARS-CoV-2 target nucleic acids are NOT DETECTED.  The SARS-CoV-2 RNA is generally detectable in upper respiratory specimens during the acute phase of infection. The lowest concentration of SARS-CoV-2 viral copies this assay can detect is 138 copies/mL. A negative result does not preclude SARS-Cov-2 infection and should not be used as the sole basis for treatment or other patient management decisions. A negative result may occur with  improper specimen collection/handling, submission of specimen other than nasopharyngeal swab, presence of viral mutation(s) within the areas targeted by this assay, and inadequate number of viral copies(<138 copies/mL). A negative result must be combined with clinical observations, patient history, and epidemiological information. The expected result is Negative.  Fact Sheet for Patients:  EntrepreneurPulse.com.au  Fact Sheet for Healthcare Providers:  IncredibleEmployment.be  This test is no t yet approved or cleared by the [place] FDA and  has been authorized for detection and/or diagnosis of SARS-CoV-2 by FDA under an Emergency Use Authorization (EUA). This EUA will remain  in effect (meaning this test can be used) for the duration of the COVID-19 declaration under Section 564(b)(1) of the Act, 21 U.S.C.section 360bbb-3(b)(1), unless the authorization is terminated  or revoked sooner.       Influenza A by PCR NEGATIVE NEGATIVE Final   Influenza B by PCR NEGATIVE NEGATIVE Final    Comment: (NOTE) The Xpert Xpress SARS-CoV-2/FLU/RSV plus assay is intended as an aid in the diagnosis of influenza from Nasopharyngeal swab  specimens and should not be used as a sole basis for treatment. Nasal washings and aspirates are unacceptable for Xpert Xpress SARS-CoV-2/FLU/RSV testing.  Fact Sheet for Patients: EntrepreneurPulse.com.au  Fact Sheet for Healthcare Providers: IncredibleEmployment.be  This test is not yet approved or cleared by the [place] FDA and has been authorized for detection and/or diagnosis of SARS-CoV-2 by FDA under an Emergency Use Authorization (EUA). This EUA will remain in effect (meaning this test can be used) for the duration of the COVID-19 declaration under Section 564(b)(1) of the Act, 21 U.S.C. section 360bbb-3(b)(1), unless the authorization is terminated or revoked.  Performed at [facility], [place] [address], [place], [place] [address]   Blood culture (routine x 2)     Status: None (Preliminary result)   Collection Time: 07/25/21  7:35 PM   Specimen: BLOOD  Result Value Ref Range Status   Specimen Description BLOOD LEFT ANTECUBITAL  Final   Special Requests   Final    BOTTLES DRAWN AEROBIC AND ANAEROBIC Blood Culture adequate volume   Culture   Final    NO GROWTH < 12 HOURS Performed at [facility], [place] [address] [place], [place] [address]    Report Status PENDING  Incomplete  Blood culture (routine x 2)     Status: None (Preliminary result)   Collection Time: 07/25/21  7:39 PM   Specimen: BLOOD LEFT HAND  Result Value Ref Range Status   Specimen Description BLOOD LEFT HAND  Final   Special Requests   Final    BOTTLES DRAWN AEROBIC AND ANAEROBIC Blood Culture adequate volume  Culture   Final    NO GROWTH < 12 HOURS Performed at [facility], [place] [address], [place], [place] [address]    Report Status PENDING  Incomplete    Procedures and diagnostic studies:  CT Angio Chest PE W and/or Wo Contrast  Result Date: 07/25/2021 CLINICAL DATA:  PE suspected, high prob.  Dyspnea. EXAM: CT ANGIOGRAPHY CHEST  WITH CONTRAST TECHNIQUE: Multidetector CT imaging of the chest was performed using the standard protocol during bolus administration of intravenous contrast. Multiplanar CT image reconstructions and MIPs were obtained to evaluate the vascular anatomy. CONTRAST:  5mL OMNIPAQUE IOHEXOL 350 MG/ML SOLN COMPARISON:  None. FINDINGS: Cardiovascular: There is adequate opacification the pulmonary arterial tree. No intraluminal filling defect identified to suggest acute pulmonary embolism. The central pulmonary arteries are of normal caliber. Moderate multi-vessel coronary artery calcification. Mild global cardiomegaly. No pericardial effusion. Mild atherosclerotic calcification within the thoracic aorta. No aortic aneurysm. Mediastinum/Nodes: No enlarged mediastinal, hilar, or axillary lymph nodes. Thyroid gland, trachea, and esophagus demonstrate no significant findings. Lungs/Pleura: Small bilateral pleural effusions are present. Mild bibasilar smooth interlobular septal thickening is in keeping with trace interstitial pulmonary edema. Mild bibasilar atelectasis. No pneumothorax. Central airways are widely patent. Upper Abdomen: No acute abnormality. Musculoskeletal: No acute bone abnormality. Multiple healed and internally fixed right rib fractures noted. No acute bone abnormality. No lytic or blastic bone lesion. Accentuated thoracic kyphosis noted. Review of the MIP images confirms the above findings. IMPRESSION: No pulmonary embolism. Trace interstitial pulmonary edema and small bilateral pleural effusions, possibly reflecting changes of mild cardiogenic failure. Mild global cardiomegaly. Moderate coronary artery calcification. Aortic Atherosclerosis (ICD10-I70.0). Electronically Signed   By: Fidela Salisbury M.D.   On: 07/25/2021 21:55   DG Chest Port 1 View  Result Date: 07/25/2021 CLINICAL DATA:  Shortness of breath. EXAM: PORTABLE CHEST 1 VIEW COMPARISON:  05/13/2018 FINDINGS: Mild cardiac enlargement with mild  pulmonary vascular congestion. Small pleural effusions are suggested. No definite edema or consolidation. Old right rib fractures with postoperative change. Calcification of the aorta. IMPRESSION: Cardiac enlargement with mild vascular congestion and small pleural effusions. Electronically Signed   By: Lucienne Capers M.D.   On: 07/25/2021 20:28    Medications:    furosemide  40 mg Intravenous BID   lisinopril  10 mg Oral Daily   Continuous Infusions:   LOS: 1 day   Geradine Girt  [facility]   How to contact the [facility] Attending or Consulting provider [place] or covering provider during after hours [place], for this patient?  Check the care team in [facility] and look for a) attending/consulting [facility] provider listed and b) the [facility] team listed Log into [email or web address] and use [facility]'s universal password to access. If you do not have the password, please contact the hospital operator. Locate the [facility] provider you are looking for under [facility] and page to a number that you can be directly reached. If you still have difficulty reaching the provider, please page the [facility] (Director on Call) for the Hospitalists listed on amion for assistance.  07/26/2021, 10:16 AM

## 2021-07-26 NOTE — ED Notes (Signed)
Attempted report x1. 

## 2021-07-26 NOTE — Consult Note (Signed)
Cardiology Consultation:   Patient ID: Brandon Robles MRN: [id number]; DOB: 1926-06-09  Admit date: 07/25/2021 Date of Consult: 07/26/2021  PCP:  Lavone Orn, MD   [facility] Providers Cardiologist:  Sinclair Grooms, MD        Patient Profile:   Brandon Robles is a 87 y.o. male with a hx of long standing bradycardia, stage 3 CKD, LBBB, CAD, HTN, GERD, hyperlipidemia who is being seen 07/26/2021 for the evaluation of elevated troponin and acute CHF.   History of Present Illness:    Brandon Robles is a 87 y.o. male with a hx of long standing bradycardia, stage 3 CKD, LBBB, CAD, HTN, GERD, hyperlipidemia who is being seen 07/26/2021 for the evaluation of elevated troponin and acute CHF. He is followed in our office by Dr. Tamala Julian for his CAD and by Dr. Lovena Le in the EP clinic for bradycardia, high grade AV block. He has not been felt to require a pacemaker. He has been very active and is still working on his cattle farm. He is known to have CAD with bare metal stent placement in the RCA in 2001 in the setting of an inferior MI and a bare metal stent placed in the LAD in 2007.   He is now admitted to [facility] with c/o dyspnea. He was found to be hypoxic. CTA chest with no evidence of PE, infiltrate or pneumothorax. Troponin up to 456. He has had no chest pain. Echo is pending today.  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  He was not wearing a mask and was inhaling dust all day. He developed a cough 2 days ago and his usual sinus drainage worsened. No chest pain. No dizziness or LE edema. No dyspnea now at rest in the bed in the ED.    Past Medical History:  Diagnosis Date   Blastocystis hominis    Bradycardia 07/18/2013   CAD (coronary artery disease)    S/P inferior MI 07/2000 with RCA stenting at that time. In 03/2006 he had a LAD non-drug-eluting stent implanted.   Chronic insomnia    Complex renal cyst    Left   Diverticulitis     ED (erectile dysfunction)    Vacuum pump   Essential hypertension, benign    Stable, on lisinopril-HCTZ & Atenolol.   GERD (gastroesophageal reflux disease)    H/O pericarditis    Hemorrhoids    LBBB (left bundle branch block)    Multiple facial fractures ([place]) 02/25/2016   Multiple rib fractures 09/25/10   PTX 09/2010   Nephrolithiasis    Renal stone basketing.   Pure hypercholesterolemia    On Red Yeast Rice & CoQ 10.   Sinoatrial node dysfunction (HCC)    EC Holter 24hr 06/24/13 showed sinus bradycardia with probable chronotropic incompetence. May need pacemaker per Dr. Daneen Schick.   Skin cancer    Left base (Face) & (Ear in 12/2011).    Past Surgical History:  Procedure Laterality Date   CORONARY ANGIOPLASTY WITH STENT PLACEMENT  07/25/00   MI in 07/25/00 with RCA stenting at that time.   CORONARY ANGIOPLASTY WITH STENT PLACEMENT  04/14/06   Bare metal stent in proximal LAD [facility] - 3.5 x16 stent) by Dr. Linard Millers.   PROSTATE BIOPSY     Evidence   RIGHT THORACOTOMY,OPEN REDUCTION AND INTERNAL FIXATION OF RIBS 4-7  09/25/2010   STONE EXTRACTION WITH  BASKET     Renal     Home Medications:  Prior to Admission medications   Medication Sig Start Date End Date Taking? Authorizing Provider  acetaminophen (TYLENOL) 325 MG tablet Take 2 tablets (650 mg total) by mouth every 4 (four) hours as needed for mild pain. 02/25/16  Yes Riebock, Estill Bakes, NP  aspirin EC 81 MG tablet Take 81 mg by mouth daily.   Yes [provider]  co-enzyme Q-10 30 MG capsule Take 30 mg by mouth daily.   Yes [provider]  lisinopril-hydrochlorothiazide (ZESTORETIC) 10-12.5 MG tablet TAKE ONE TABLET EVERY DAY Patient taking differently: Take 1 tablet by mouth daily. 01/28/21  Yes Evans Lance, MD  nitroGLYCERIN (NITROSTAT) 0.4 MG SL tablet Place 0.4 mg under the tongue every 5 (five) minutes as needed for chest pain. (MAXIMUM of 3 TABLETS)   Yes [provider]   OVER THE COUNTER MEDICATION Take 2 capsules by mouth daily. OTC [place] capsules   Yes [provider]  OVER THE COUNTER MEDICATION Take 1 capsule by mouth daily. OMEGA Q PLUS   Yes [provider]  phenylephrine (SUDAFED PE) 10 MG TABS tablet Take 10 mg by mouth every 6 (six) hours as needed (nasal congestion).   Yes [provider]  Red Yeast Rice 600 MG CAPS Take 600 mg by mouth daily.   Yes [provider]  sodium chloride (OCEAN) 0.65 % SOLN nasal spray Place 1 spray into both nostrils daily as needed for congestion.   Yes [provider]  temazepam (RESTORIL) 30 MG capsule Take 30 mg by mouth at bedtime.   Yes [provider]  Throat Lozenges [facility]) Use as directed 1 lozenge in the mouth or throat 2 (two) times daily as needed (congestion).   Yes [provider]  amoxicillin (AMOXIL) 500 MG capsule Take 4 capsules by mouth as needed 1 hour prior to dental procedures. 02/20/17   [provider]    Inpatient Medications: Scheduled Meds:  furosemide  40 mg Intravenous BID   lisinopril  10 mg Oral Daily   Continuous Infusions:  PRN Meds: acetaminophen **OR** acetaminophen, albuterol, melatonin  Allergies:    Allergies  Allergen Reactions   Vitamin D Analogs Other (See Comments)    "pins and needles feeling"   Fish Oil Other (See Comments)   Ambien [Zolpidem Tartrate] Other (See Comments)    Confusion   Honey Other (See Comments)    "pins and needles feeling"    Social History:   Social History   Socioeconomic History   Marital status: Single    Spouse name: Not on file   Number of children: Not on file   Years of education: Not on file   Highest education level: Not on file  Occupational History   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: RETIRED  Tobacco Use   Smoking status: Former    Types: Cigars   Smokeless tobacco: Never   Tobacco comments:    quit 1990  Vaping Use   Vaping Use:  Never used  Substance and Sexual Activity   Alcohol use: Yes    Comment: NOT ON A REGULAR BASIS   Drug use: No   Sexual activity: Not on file  Other Topics Concern   Not on file  Social History Narrative   Not on file   Social Determinants of Health   Financial Resource Strain: Not on file  Food Insecurity: Not on file  Transportation Needs: Not  on file  Physical Activity: Not on file  Stress: Not on file  Social Connections: Not on file  Intimate Partner Violence: Not on file    Family History:    Family History  Problem Relation Age of Onset   CVA Mother    Heart attack Father    Kidney failure Father    Heart disease Sister        Possible heart disease   Aneurysm Sister    Hypertension Brother    Heart disease Brother    Heart failure Brother    Lung disease Brother    Diabetes Brother      ROS:  Please see the history of present illness.  All other ROS reviewed and negative.     Physical Exam/Data:   Vitals:   07/26/21 1006 07/26/21 1113 07/26/21 1152 07/26/21 1400  BP:  (!) 97/58 (!) 108/53 (!) 111/57  Pulse:  (!) 48 (!) 48 (!) 47  Resp:  17 15 17   Temp:      TempSrc:      SpO2: 98% 95% 97% 96%  Weight:      Height:        Intake/Output Summary (Last 24 hours) at 07/26/2021 1620 Last data filed at 07/26/2021 1234 Gross per 24 hour  Intake --  Output 400 ml  Net -400 ml   Last 3 Weights 07/26/2021 10/23/2020 07/22/2020  Weight (lbs) 164 lb 14.5 oz 165 lb 160 lb  Weight (kg) 74.8 kg 74.844 kg 72.576 kg     Body mass index is 25.83 kg/m.  General:  Well nourished, well developed, in no acute distress HEENT: normal Neck: no JVD Vascular: No carotid bruits; Distal pulses 2+ bilaterally Cardiac:  normal S1, S2; RRR; no murmur  Lungs:  clear to auscultation bilaterally, no wheezing, rhonchi or rales  Abd: soft, nontender, no hepatomegaly  Ext: no edema Musculoskeletal:  No deformities, BUE and BLE strength normal and equal Skin: warm and dry   Neuro:  CNs 2-12 intact, no focal abnormalities noted Psych:  Normal affect   EKG:  The EKG was personally reviewed and demonstrates:  sinus, Second degree AV block type 1 with LBBB Telemetry:  Telemetry was personally reviewed and demonstrates:  Sinus with possible complete heart block, rate 60 bpm  Relevant CV Studies: Echo pending  Laboratory Data:  High Sensitivity Troponin:   Recent Labs  Lab 07/25/21 1922 07/25/21 2309 07/26/21 0736  TROPONINIHS 32* 286* 456*     Chemistry Recent Labs  Lab 07/25/21 1922 07/25/21 1943 07/25/21 1946 07/25/21 2309 07/26/21 0024 07/26/21 0348  NA 138   < > 139  --  139 139  K 4.8   < > 4.8  --  3.7 4.3  CL 104  --  108  --   --  106  CO2 18*  --   --   --   --  23  GLUCOSE 248*  --  249*  --   --  185*  BUN 27*  --  35*  --   --  29*  CREATININE 1.52*  --  1.40*  --   --  1.64*  CALCIUM 8.9  --   --   --   --  8.8*  MG  --   --   --  2.0  --  1.9  GFRNONAA 43*  --   --   --   --  39*  ANIONGAP 16*  --   --   --   --  10   < > = values in this interval not displayed.    Recent Labs  Lab 07/25/21 1922 07/26/21 0348  PROT 6.5 6.1*  ALBUMIN 4.0 3.5  AST 34 31  ALT 22 22  ALKPHOS 43 32*  BILITOT 1.0 1.2   Lipids No results for input(s): CHOL, TRIG, HDL, LABVLDL, LDLCALC, CHOLHDL in the last 168 hours.  Hematology Recent Labs  Lab 07/25/21 1922 07/25/21 1943 07/25/21 1946 07/26/21 0024 07/26/21 0348  WBC 12.2*  --   --   --  7.7  RBC 3.85*  --   --   --  3.48*  HGB 12.6*   < > 13.6 12.6* 11.7*  HCT 40.0   < > 40.0 37.0* 35.2*  MCV 103.9*  --   --   --  101.1*  MCH 32.7  --   --   --  33.6  MCHC 31.5  --   --   --  33.2  RDW 14.5  --   --   --  14.6  PLT 259  --   --   --  196   < > = values in this interval not displayed.   Thyroid No results for input(s): TSH, FREET4 in the last 168 hours.  BNP Recent Labs  Lab 07/25/21 1922  BNP 742.7*    DDimer No results for input(s): DDIMER in the last 168  hours.   Radiology/Studies:  CT Angio Chest PE W and/or Wo Contrast  Result Date: 07/25/2021 CLINICAL DATA:  PE suspected, high prob.  Dyspnea. EXAM: CT ANGIOGRAPHY CHEST WITH CONTRAST TECHNIQUE: Multidetector CT imaging of the chest was performed using the standard protocol during bolus administration of intravenous contrast. Multiplanar CT image reconstructions and MIPs were obtained to evaluate the vascular anatomy. CONTRAST:  30mL OMNIPAQUE IOHEXOL 350 MG/ML SOLN COMPARISON:  None. FINDINGS: Cardiovascular: There is adequate opacification the pulmonary arterial tree. No intraluminal filling defect identified to suggest acute pulmonary embolism. The central pulmonary arteries are of normal caliber. Moderate multi-vessel coronary artery calcification. Mild global cardiomegaly. No pericardial effusion. Mild atherosclerotic calcification within the thoracic aorta. No aortic aneurysm. Mediastinum/Nodes: No enlarged mediastinal, hilar, or axillary lymph nodes. Thyroid gland, trachea, and esophagus demonstrate no significant findings. Lungs/Pleura: Small bilateral pleural effusions are present. Mild bibasilar smooth interlobular septal thickening is in keeping with trace interstitial pulmonary edema. Mild bibasilar atelectasis. No pneumothorax. Central airways are widely patent. Upper Abdomen: No acute abnormality. Musculoskeletal: No acute bone abnormality. Multiple healed and internally fixed right rib fractures noted. No acute bone abnormality. No lytic or blastic bone lesion. Accentuated thoracic kyphosis noted. Review of the MIP images confirms the above findings. IMPRESSION: No pulmonary embolism. Trace interstitial pulmonary edema and small bilateral pleural effusions, possibly reflecting changes of mild cardiogenic failure. Mild global cardiomegaly. Moderate coronary artery calcification. Aortic Atherosclerosis (ICD10-I70.0). Electronically Signed   By: Fidela Salisbury M.D.   On: 07/25/2021 21:55   DG  Chest Port 1 View  Result Date: 07/25/2021 CLINICAL DATA:  Shortness of breath. EXAM: PORTABLE CHEST 1 VIEW COMPARISON:  05/13/2018 FINDINGS: Mild cardiac enlargement with mild pulmonary vascular congestion. Small pleural effusions are suggested. No definite edema or consolidation. Old right rib fractures with postoperative change. Calcification of the aorta. IMPRESSION: Cardiac enlargement with mild vascular congestion and small pleural effusions. Electronically Signed   By: Lucienne Capers M.D.   On: 07/25/2021 20:28     Assessment and Plan:   Acute hypoxic respiratory failure: Possible component of CHD but  he does not appear to be volume overloaded on exam. Chest CT with trace interstitial edema. He has received IV Lasix with some diuresis. He also received steroids. Agree with continuing IV lasix tonight. Echo pending  Elevated troponin: He has no chest pain. No ischemic changes on EKG. Likely demand ischemia in the setting of acute CHF/possible viral syndrome. I do not think IV heparin is indicated in this elderly male.   3.    High grade AV block: Chronic, asymptomatic. Followed by EP   Risk Assessment/Risk Scores:    For questions or updates, please contact [place] Please consult [email or web address] for contact info under    Signed, Lauree Chandler, MD  07/26/2021 4:20 PM

## 2021-07-26 NOTE — ED Notes (Signed)
Pt set up for breakfast, transferred to hospital bed for comfort

## 2021-07-26 NOTE — ED Notes (Signed)
Removed pt off 2L O2 nasal canula to assess while on room air. Pt currently room air 98% sitting at 45 degree HOB.

## 2021-07-26 NOTE — ED Notes (Signed)
Attempted report x 2 

## 2021-07-26 NOTE — Progress Notes (Signed)
   07/26/21 1741  Assess: MEWS Score  Temp 98 F (36.7 C)  BP (!) 100/50  Pulse Rate (!) 50  ECG Heart Rate (!) 50  Resp 20  Level of Consciousness Alert  SpO2 90 %  O2 Device Room Air  Assess: MEWS Score  MEWS Temp 0  MEWS Systolic 1  MEWS Pulse 1  MEWS RR 0  MEWS LOC 0  MEWS Score 2  MEWS Score Color Yellow  Assess: if the MEWS score is Yellow or Red  Were vital signs taken at a resting state? Yes  Early Detection of Sepsis Score *See Row Information* Medium  MEWS guidelines implemented *See Row Information* Yes  Treat  MEWS Interventions Escalated (See documentation below)  Pain Scale 0-10  Pain Score 0  Patients Stated Pain Goal 0  Take Vital Signs  Increase Vital Sign Frequency  Yellow: Q 2hr X 2 then Q 4hr X 2, if remains yellow, continue Q 4hrs  Escalate  MEWS: Escalate Yellow: discuss with charge nurse/RN and consider discussing with provider and RRT  Notify: Charge Nurse/RN  Name of Charge Nurse/RN Notified Carroll Kinds, RN  Date Charge Nurse/RN Notified 07/26/21  Time Charge Nurse/RN Notified 1741  Document  Patient Outcome Other (Comment) (pt remains stable)  Progress note created (see row info) Yes

## 2021-07-27 DIAGNOSIS — I5023 Acute on chronic systolic (congestive) heart failure: Secondary | ICD-10-CM

## 2021-07-27 LAB — CBC
HCT: 31.8 % — ABNORMAL LOW (ref 39.0–52.0)
Hemoglobin: 10.6 g/dL — ABNORMAL LOW (ref 13.0–17.0)
MCH: 32.7 pg (ref 26.0–34.0)
MCHC: 33.3 g/dL (ref 30.0–36.0)
MCV: 98.1 fL (ref 80.0–100.0)
Platelets: 175 10*3/uL (ref 150–400)
RBC: 3.24 MIL/uL — ABNORMAL LOW (ref 4.22–5.81)
RDW: 14.7 % (ref 11.5–15.5)
WBC: 18.7 10*3/uL — ABNORMAL HIGH (ref 4.0–10.5)
nRBC: 0 % (ref 0.0–0.2)

## 2021-07-27 LAB — BASIC METABOLIC PANEL
Anion gap: 11 (ref 5–15)
BUN: 36 mg/dL — ABNORMAL HIGH (ref 8–23)
CO2: 24 mmol/L (ref 22–32)
Calcium: 8.9 mg/dL (ref 8.9–10.3)
Chloride: 102 mmol/L (ref 98–111)
Creatinine, Ser: 1.64 mg/dL — ABNORMAL HIGH (ref 0.61–1.24)
GFR, Estimated: 39 mL/min — ABNORMAL LOW (ref >60)
Glucose, Bld: 126 mg/dL — ABNORMAL HIGH (ref 70–99)
Potassium: 3.7 mmol/L (ref 3.5–5.1)
Sodium: 137 mmol/L (ref 135–145)

## 2021-07-27 LAB — GLUCOSE, CAPILLARY
Glucose-Capillary: 118 mg/dL — ABNORMAL HIGH (ref 70–99)
Glucose-Capillary: 111 mg/dL — ABNORMAL HIGH (ref 70–99)
Glucose-Capillary: 108 mg/dL — ABNORMAL HIGH (ref 70–99)
Glucose-Capillary: 103 mg/dL — ABNORMAL HIGH (ref 70–99)

## 2021-07-27 MED ORDER — TEMAZEPAM 15 MG PO CAPS
30.0000 mg | ORAL_CAPSULE | Freq: Every day | ORAL | Status: DC
  Administered 2021-07-27: 30 mg via ORAL
  Filled 2021-07-27: qty 2

## 2021-07-27 NOTE — Progress Notes (Addendum)
Progress Note  Patient Name: Brandon Robles Date of Encounter: 07/27/2021  [facility] Cardiologist: Belva Crome III, MD   Subjective   No complaints this morning, wants to go home.   Inpatient Medications    Scheduled Meds:  furosemide  20 mg Intravenous BID   guaiFENesin  600 mg Oral BID   Continuous Infusions:  PRN Meds: acetaminophen **OR** acetaminophen, albuterol, melatonin, sodium chloride   Vital Signs    Vitals:   07/26/21 1942 07/27/21 0016 07/27/21 0458 07/27/21 0753  BP: 131/69 (!) 95/52 (!) 105/38 (!) 92/42  Pulse: 63 (!) 57 (!) 42 (!) 45  Resp: 19 18 16 18   Temp: 97.8 F (36.6 C) 98.2 F (36.8 C) 97.6 F (36.4 C) 98 F (36.7 C)  TempSrc: Oral Oral Oral Oral  SpO2: 91% 96% 98% 96%  Weight:   72.1 kg   Height:        Intake/Output Summary (Last 24 hours) at 07/27/2021 0924 Last data filed at 07/27/2021 0500 Gross per 24 hour  Intake 240 ml  Output 550 ml  Net -310 ml   Last 3 Weights 07/27/2021 07/26/2021 10/23/2020  Weight (lbs) 159 lb 164 lb 14.5 oz 165 lb  Weight (kg) 72.122 kg 74.8 kg 74.844 kg      Telemetry    Bradycardia with 2nd Degree AVB type I - Personally Reviewed  ECG    No new tracing this morning  Physical Exam   GEN: No acute distress.   Neck: No JVD Cardiac: Loletha Grayer, soft systolic murmur, no rubs, or gallops.  Respiratory: crackles in bases bilaterally GI: Soft, nontender, non-distended  MS: No edema; No deformity. Neuro:  Nonfocal  Psych: Normal affect   Labs    High Sensitivity Troponin:   Recent Labs  Lab 07/25/21 1922 07/25/21 2309 07/26/21 0736  TROPONINIHS 32* 286* 456*     Chemistry Recent Labs  Lab 07/25/21 1922 07/25/21 1943 07/25/21 1946 07/25/21 2309 07/26/21 0024 07/26/21 0348 07/27/21 0057  NA 138   < > 139  --  139 139 137  K 4.8   < > 4.8  --  3.7 4.3 3.7  CL 104  --  108  --   --  106 102  CO2 18*  --   --   --   --  23 24  GLUCOSE 248*  --  249*  --   --  185* 126*  BUN 27*   --  35*  --   --  29* 36*  CREATININE 1.52*  --  1.40*  --   --  1.64* 1.64*  CALCIUM 8.9  --   --   --   --  8.8* 8.9  MG  --   --   --  2.0  --  1.9  --   PROT 6.5  --   --   --   --  6.1*  --   ALBUMIN 4.0  --   --   --   --  3.5  --   AST 34  --   --   --   --  31  --   ALT 22  --   --   --   --  22  --   ALKPHOS 43  --   --   --   --  32*  --   BILITOT 1.0  --   --   --   --  1.2  --  GFRNONAA 43*  --   --   --   --  39* 39*  ANIONGAP 16*  --   --   --   --  10 11   < > = values in this interval not displayed.    Lipids No results for input(s): CHOL, TRIG, HDL, LABVLDL, LDLCALC, CHOLHDL in the last 168 hours.  Hematology Recent Labs  Lab 07/25/21 1922 07/25/21 1943 07/26/21 0024 07/26/21 0348 07/27/21 0057  WBC 12.2*  --   --  7.7 18.7*  RBC 3.85*  --   --  3.48* 3.24*  HGB 12.6*   < > 12.6* 11.7* 10.6*  HCT 40.0   < > 37.0* 35.2* 31.8*  MCV 103.9*  --   --  101.1* 98.1  MCH 32.7  --   --  33.6 32.7  MCHC 31.5  --   --  33.2 33.3  RDW 14.5  --   --  14.6 14.7  PLT 259  --   --  196 175   < > = values in this interval not displayed.   Thyroid No results for input(s): TSH, FREET4 in the last 168 hours.  BNP Recent Labs  Lab 07/25/21 1922  BNP 742.7*    DDimer No results for input(s): DDIMER in the last 168 hours.   Radiology    CT Angio Chest PE W and/or Wo Contrast  Result Date: 07/25/2021 CLINICAL DATA:  PE suspected, high prob.  Dyspnea. EXAM: CT ANGIOGRAPHY CHEST WITH CONTRAST TECHNIQUE: Multidetector CT imaging of the chest was performed using the standard protocol during bolus administration of intravenous contrast. Multiplanar CT image reconstructions and MIPs were obtained to evaluate the vascular anatomy. CONTRAST:  41mL OMNIPAQUE IOHEXOL 350 MG/ML SOLN COMPARISON:  None. FINDINGS: Cardiovascular: There is adequate opacification the pulmonary arterial tree. No intraluminal filling defect identified to suggest acute pulmonary embolism. The central pulmonary  arteries are of normal caliber. Moderate multi-vessel coronary artery calcification. Mild global cardiomegaly. No pericardial effusion. Mild atherosclerotic calcification within the thoracic aorta. No aortic aneurysm. Mediastinum/Nodes: No enlarged mediastinal, hilar, or axillary lymph nodes. Thyroid gland, trachea, and esophagus demonstrate no significant findings. Lungs/Pleura: Small bilateral pleural effusions are present. Mild bibasilar smooth interlobular septal thickening is in keeping with trace interstitial pulmonary edema. Mild bibasilar atelectasis. No pneumothorax. Central airways are widely patent. Upper Abdomen: No acute abnormality. Musculoskeletal: No acute bone abnormality. Multiple healed and internally fixed right rib fractures noted. No acute bone abnormality. No lytic or blastic bone lesion. Accentuated thoracic kyphosis noted. Review of the MIP images confirms the above findings. IMPRESSION: No pulmonary embolism. Trace interstitial pulmonary edema and small bilateral pleural effusions, possibly reflecting changes of mild cardiogenic failure. Mild global cardiomegaly. Moderate coronary artery calcification. Aortic Atherosclerosis (ICD10-I70.0). Electronically Signed   By: Fidela Salisbury M.D.   On: 07/25/2021 21:55   DG Chest Port 1 View  Result Date: 07/25/2021 CLINICAL DATA:  Shortness of breath. EXAM: PORTABLE CHEST 1 VIEW COMPARISON:  05/13/2018 FINDINGS: Mild cardiac enlargement with mild pulmonary vascular congestion. Small pleural effusions are suggested. No definite edema or consolidation. Old right rib fractures with postoperative change. Calcification of the aorta. IMPRESSION: Cardiac enlargement with mild vascular congestion and small pleural effusions. Electronically Signed   By: Lucienne Capers M.D.   On: 07/25/2021 20:28   ECHOCARDIOGRAM COMPLETE  Result Date: 07/26/2021    ECHOCARDIOGRAM REPORT   Patient Name:   Brandon Robles Date of Exam: 07/26/2021 Medical Rec #:   [id number]  Height:       67.0 in Accession #:    [id number]  Weight:       164.9 lb Date of Birth:  10-14-1926  BSA:          1.863 m Patient Age:    87 years    BP:           111/57 mmHg Patient Gender: M           HR:           55 bpm. Exam Location:  Inpatient Procedure: 2D Echo, Cardiac Doppler, Color Doppler and Intracardiac            Opacification Agent Indications:    CHF-Acute Systolic K24.09  History:        Patient has no prior history of Echocardiogram examinations. CAD                 and Previous Myocardial Infarction; Prior CABG.  Sonographer:    Merrie Roof RDCS Referring Phys: [id number] [place]  1. Left ventricular ejection fraction, by estimation, is 30 to 35%. The left ventricle has moderately decreased function. The left ventricle demonstrates regional wall motion abnormalities (see scoring diagram/findings for description). Left ventricular  diastolic function could not be evaluated.  2. Right ventricular systolic function is normal. The right ventricular size is mildly enlarged. There is severely elevated pulmonary artery systolic pressure. The estimated right ventricular systolic pressure is 24.2 mmHg.  3. Left atrial size was mild to moderately dilated.  4. Right atrial size was mild to moderately dilated.  5. The mitral valve is normal in structure. Moderate mitral valve regurgitation.  6. Tricuspid valve regurgitation is moderate to severe.  7. The aortic valve is tricuspid. There is mild calcification of the aortic valve. There is mild thickening of the aortic valve. Aortic valve regurgitation is mild. Mild aortic valve sclerosis is present, with no evidence of aortic valve stenosis.  8. The inferior vena cava is normal in size with <50% respiratory variability, suggesting right atrial pressure of 8 mmHg. Comparison(s): No prior Echocardiogram. Conclusion(s)/Recommendation(s): LVEF reduced, with focal wall motion abnormalities as noted. Slow echo contrast movement at  the LV apex, but no thrombus seen. Severely elevated right sided filling pressures. FINDINGS  Left Ventricle: Left ventricular ejection fraction, by estimation, is 30 to 35%. The left ventricle has moderately decreased function. The left ventricle demonstrates regional wall motion abnormalities. Definity contrast agent was given IV to delineate the left ventricular endocardial borders. The left ventricular internal cavity size was normal in size. There is no left ventricular hypertrophy. Left ventricular diastolic function could not be evaluated.  LV Wall Scoring: The apical septal segment, apical inferior segment, and apex are akinetic. The inferior wall, mid anteroseptal segment, apical lateral segment, mid inferoseptal segment, and apical anterior segment are hypokinetic. The anterior wall, antero-lateral wall, posterior wall, basal anteroseptal segment, and basal inferoseptal segment are normal. Right Ventricle: The right ventricular size is mildly enlarged. Right vetricular wall thickness was not well visualized. Right ventricular systolic function is normal. There is severely elevated pulmonary artery systolic pressure. The tricuspid regurgitant velocity is 3.70 m/s, and with an assumed right atrial pressure of 8 mmHg, the estimated right ventricular systolic pressure is 68.3 mmHg. Left Atrium: Left atrial size was mild to moderately dilated. Right Atrium: Right atrial size was mild to moderately dilated. Pericardium: There is no evidence of pericardial effusion. Mitral Valve: The mitral valve is normal in structure. Moderate  mitral valve regurgitation. Tricuspid Valve: The tricuspid valve is normal in structure. Tricuspid valve regurgitation is moderate to severe. No evidence of tricuspid stenosis. Aortic Valve: The aortic valve is tricuspid. There is mild calcification of the aortic valve. There is mild thickening of the aortic valve. Aortic valve regurgitation is mild. Aortic regurgitation PHT measures 545  msec. Mild aortic valve sclerosis is present, with no evidence of aortic valve stenosis. Aortic valve mean gradient measures 4.0 mmHg. Aortic valve peak gradient measures 8.1 mmHg. Aortic valve area, by VTI measures 1.80 cm. Pulmonic Valve: The pulmonic valve was grossly normal. Pulmonic valve regurgitation is mild. No evidence of pulmonic stenosis. Aorta: The aortic root and ascending aorta are structurally normal, with no evidence of dilitation. Venous: The inferior vena cava is normal in size with less than 50% respiratory variability, suggesting right atrial pressure of 8 mmHg. IAS/Shunts: The atrial septum is grossly normal.  LEFT VENTRICLE PLAX 2D LVIDd:         5.00 cm LVIDs:         3.60 cm LV PW:         0.90 cm LV IVS:        1.05 cm LVOT diam:     2.00 cm LV SV:         56 LV SV Index:   30 LVOT Area:     3.14 cm  LV Volumes (MOD) LV vol d, MOD A2C: 84.5 ml LV vol d, MOD A4C: 133.0 ml LV vol s, MOD A2C: 55.0 ml LV vol s, MOD A4C: 96.0 ml LV SV MOD A2C:     29.5 ml LV SV MOD A4C:     133.0 ml LV SV MOD BP:      24.4 ml RIGHT VENTRICLE          IVC RV Basal diam:  4.10 cm  IVC diam: 1.90 cm RV Mid diam:    3.10 cm LEFT ATRIUM           Index        RIGHT ATRIUM           Index LA diam:      3.20 cm 1.72 cm/m   RA Area:     19.40 cm LA Vol (A2C): 80.2 ml 43.05 ml/m  RA Volume:   55.60 ml  29.84 ml/m LA Vol (A4C): 51.4 ml 27.59 ml/m  AORTIC VALVE AV Area (Vmax):    2.16 cm AV Area (Vmean):   1.98 cm AV Area (VTI):     1.80 cm AV Vmax:           142.00 cm/s AV Vmean:          91.100 cm/s AV VTI:            0.313 m AV Peak Grad:      8.1 mmHg AV Mean Grad:      4.0 mmHg LVOT Vmax:         97.80 cm/s LVOT Vmean:        57.500 cm/s LVOT VTI:          0.179 m LVOT/AV VTI ratio: 0.57 AI PHT:            545 msec  AORTA Ao Root diam: 3.50 cm TRICUSPID VALVE TR Peak grad:   54.8 mmHg TR Vmax:        370.00 cm/s  SHUNTS Systemic VTI:  0.18 m Systemic Diam: 2.00 cm Buford Dresser MD Electronically  signed by  Buford Dresser MD Signature Date/Time: 07/26/2021/6:06:10 PM    Final     Cardiac Studies   Echo: 07/26/21  IMPRESSIONS     1. Left ventricular ejection fraction, by estimation, is 30 to 35%. The  left ventricle has moderately decreased function. The left ventricle  demonstrates regional wall motion abnormalities (see scoring  diagram/findings for description). Left ventricular   diastolic function could not be evaluated.   2. Right ventricular systolic function is normal. The right ventricular  size is mildly enlarged. There is severely elevated pulmonary artery  systolic pressure. The estimated right ventricular systolic pressure is  13.2 mmHg.   3. Left atrial size was mild to moderately dilated.   4. Right atrial size was mild to moderately dilated.   5. The mitral valve is normal in structure. Moderate mitral valve  regurgitation.   6. Tricuspid valve regurgitation is moderate to severe.   7. The aortic valve is tricuspid. There is mild calcification of the  aortic valve. There is mild thickening of the aortic valve. Aortic valve  regurgitation is mild. Mild aortic valve sclerosis is present, with no  evidence of aortic valve stenosis.   8. The inferior vena cava is normal in size with <50% respiratory  variability, suggesting right atrial pressure of 8 mmHg.   Comparison(s): No prior Echocardiogram.   Conclusion(s)/Recommendation(s): LVEF reduced, with focal wall motion  abnormalities as noted. Slow echo contrast movement at the LV apex, but no  thrombus seen. Severely elevated right sided filling pressures.   FINDINGS   Left Ventricle: Left ventricular ejection fraction, by estimation, is 30  to 35%. The left ventricle has moderately decreased function. The left  ventricle demonstrates regional wall motion abnormalities. Definity  contrast agent was given IV to delineate  the left ventricular endocardial borders. The left ventricular internal  cavity  size was normal in size. There is no left ventricular hypertrophy.  Left ventricular diastolic function could not be evaluated.      LV Wall Scoring:  The apical septal segment, apical inferior segment, and apex are akinetic.  The inferior wall, mid anteroseptal segment, apical lateral segment, mid  inferoseptal segment, and apical anterior segment are hypokinetic. The  anterior wall, antero-lateral wall, posterior wall, basal anteroseptal  segment, and basal inferoseptal segment are normal.   Right Ventricle: The right ventricular size is mildly enlarged. Right  vetricular wall thickness was not well visualized. Right ventricular  systolic function is normal. There is severely elevated pulmonary artery  systolic pressure. The tricuspid  regurgitant velocity is 3.70 m/s, and with an assumed right atrial  pressure of 8 mmHg, the estimated right ventricular systolic pressure is  44.0 mmHg.   Left Atrium: Left atrial size was mild to moderately dilated.   Right Atrium: Right atrial size was mild to moderately dilated.   Pericardium: There is no evidence of pericardial effusion.   Mitral Valve: The mitral valve is normal in structure. Moderate mitral  valve regurgitation.   Tricuspid Valve: The tricuspid valve is normal in structure. Tricuspid  valve regurgitation is moderate to severe. No evidence of tricuspid  stenosis.   Aortic Valve: The aortic valve is tricuspid. There is mild calcification  of the aortic valve. There is mild thickening of the aortic valve. Aortic  valve regurgitation is mild. Aortic regurgitation PHT measures 545 msec.  Mild aortic valve sclerosis is  present, with no evidence of aortic valve stenosis. Aortic valve mean  gradient measures 4.0 mmHg. Aortic valve peak gradient measures  8.1 mmHg.  Aortic valve area, by VTI measures 1.80 cm.   Pulmonic Valve: The pulmonic valve was grossly normal. Pulmonic valve  regurgitation is mild. No evidence of pulmonic  stenosis.   Aorta: The aortic root and ascending aorta are structurally normal, with  no evidence of dilitation.   Venous: The inferior vena cava is normal in size with less than 50%  respiratory variability, suggesting right atrial pressure of 8 mmHg.   IAS/Shunts: The atrial septum is grossly normal.   Patient Profile     87 y.o. male with PMH of bradycardia, CKD III, LBBB, CAD s/p inferior MI with RCA stenting '01, HTN, GERD, HLD and high degree AVB who presented with elevated troponin and CHF.   Assessment & Plan    HFrEF: Echo shows EF of 30-35%, with apical inferior wall and apex being akinetic, anteroseptal, inferoseptal and apical anterior wall hypokinetic. Severely elevated PA pressure, moderate bi-atrial enlargement, mildly enlarged RV , moderate to severe TR. No prior echo found to compare. BNP 742, CT chest with small bilateral pleural effusions.  -- Given IV lasix, net -310 cc but reports he has been using the toilet not the urinal. Has crackles in bases, would continue IV lasix through today -- GDMT very limited, unable to use BB with hx of AVB block/bradycardia, no ACE/ARB with CKD III, blood pressures are soft as well   Elevated Troponin/CAD with prior stenting to RCA '01: 32>>286>>456. No chest pain PTA. Suspect demand ischemia in the setting of CHF.  High degree AVB: hx of the same, followed by EP. Appears most 2nd degree AVB type 1 currently. Not on BB. Denies any syncope PTA. Review plan with MD with new cardiomyopathy.  HTN: blood pressures are soft this morning. Currently on no meds.    For questions or updates, please contact [place] Please consult [email or web address] for contact info under        Signed, Reino Bellis, NP  07/27/2021, 9:24 AM    I have personally seen and examined this patient. I agree with the assessment and plan as outlined above.  He is asking to go home. Still with crackles on exam. His LVEF is down on echo but no recent echo for  comparison. He has not chest pain. Given advanced age, I would favor a conservative approach. We are limited in options for GDMT for his cardiomyopathy and CHF. Cannot start a beta blocker with his conduction disease and no Ace -in or ARB with CKD and soft BP. Probable discharge tomorrow.   Lauree Chandler 07/27/2021 11:01 AM

## 2021-07-27 NOTE — Progress Notes (Signed)
Progress Note    Brandon Robles  HUD:[id number] DOB: [birth year]  DOA: 07/25/2021 PCP: Lavone Orn, MD    Brief Narrative:     Medical records reviewed and are as summarized below:  Brandon Robles is an 87 y.o. male admitted with acute hypoxic resp failure d/t acute on chronic diastolic HF. Diuresing with IV Lasix. Trending mildly elevated trops, suspected to be d/t Type 2 process. Echo order for AM. Has known h/o of chronic long-term high grade heart block, follows with Dr. Lovena Le. No indication for pacer per last appt with Dr. Lovena Le.  Plan for continued diuresis and weaning of O2.    Assessment/Plan:   Principal Problem:   Acute respiratory failure with hypoxia (HCC) Active Problems:   Hypertension   Acute on chronic diastolic CHF (congestive heart failure) (HCC)   Elevated troponin   Metabolic acidosis   Lactic acidosis   Hyperglycemia   CKD (chronic kidney disease) stage 3, GFR 30-59 ml/min (HCC)    Acute hypoxic respiratory failure:  - initial O2 sats in the high 80s on room air, simply improving into the mid to high 90s on 3 to 4 L nasal cannula, -CTA chest showed no evidence of acute pulmonary embolism also showed no evidence of infiltrate or pneumothorax.  -wean off O2 as able   Acute on chronic diastolic heart failure:  -dx of acute decompensation on the basis of presenting 2 days of progressive shortness of breath with imaging evidence of interval development of interstitial edema.  - strict I's & O's and daily weights.  -Monitor on telemetry .  - Lasix IV -echo: Left ventricular ejection fraction, by estimation, is 30 to 35%. The  left ventricle has moderately decreased function. The left ventricle  demonstrates regional wall motion abnormalities  -cards consult: Still with crackles on exam. His LVEF is down on echo but no recent echo for comparison. He has not chest pain. Given advanced age, I would favor a conservative approach. We are limited in options for  GDMT for his cardiomyopathy and CHF. Cannot start a beta blocker with his conduction disease and no Ace -in or ARB with CKD and soft BP. Probable discharge tomorrow    Elevated troponin:  -no chest pain -trending up -? Demand ischemia -cards consult appreciated: He has not chest pain. Given advanced age, I would favor a conservative approach. We are limited in options for GDMT for his cardiomyopathy and CHF. Cannot start a beta blocker with his conduction disease and no Ace -in or ARB with CKD and soft BP. Probable discharge tomorrow  Hyperglycemia:  -In the absence of any known history of diabetes -hemoglobin A1c: 5.4  Essential hypertension:  - BP on low side will hold lisinopril - Hold home HCTZ for now.   -IV lasix   Stage IIIa chronic kidney disease -baseline creatinine range of 1.3-1.5, with presenting serum creatinine found to be consistent with this range. -Monitor strict I's and O's Daily weights.  -avoid nephrotoxic agents.   -Repeat BMP in the morning.   Leucocytosis -given dose of IV steroids -recheck in AM No fever     Family Communication/Anticipated D/C date and plan/Code Status   DVT prophylaxis: scd Code Status: DNR Disposition Plan: Status is: Inpatient  Remains inpatient appropriate because:Inpatient level of care appropriate due to severity of illness  Dispo: The patient is from: Home              Anticipated d/c is to: Home  Patient currently is not medically stable to d/c. Home in AM if off O2 and ok with cards   Difficult to place patient No         Medical Consultants:   cards    Subjective:   No current complaints, still on O2 but denies SOB  Objective:    Vitals:   07/27/21 0016 07/27/21 0458 07/27/21 0753 07/27/21 1138  BP: (!) 95/52 (!) 105/38 (!) 92/42 (!) 108/54  Pulse: (!) 57 (!) 42 (!) 45   Resp: 18 16 18 19   Temp: 98.2 F (36.8 C) 97.6 F (36.4 C) 98 F (36.7 C) 97.6 F (36.4 C)  TempSrc: Oral Oral  Oral Oral  SpO2: 96% 98% 96% 96%  Weight:  72.1 kg    Height:        Intake/Output Summary (Last 24 hours) at 07/27/2021 1230 Last data filed at 07/27/2021 1114 Gross per 24 hour  Intake 240 ml  Output 500 ml  Net -260 ml   Filed Weights   07/26/21 0726 07/27/21 0458  Weight: 74.8 kg 72.1 kg    Exam:   General: Appearance:    Well developed, well nourished male in no acute distress     Lungs:     Clear to auscultation bilaterally, respirations unlabored  Heart:    Bradycardic.   MS:   All extremities are intact.    Neurologic:   Awake, alert, oriented x 3. No apparent focal neurological           defect.        Data Reviewed:   I have personally reviewed following labs and imaging studies:  Labs: Labs show the following:   Basic Metabolic Panel: Recent Labs  Lab 07/25/21 1922 07/25/21 1943 07/25/21 1946 07/25/21 2309 07/26/21 0024 07/26/21 0348 07/27/21 0057  NA 138 140 139  --  139 139 137  K 4.8 3.9 4.8  --  3.7 4.3 3.7  CL 104  --  108  --   --  106 102  CO2 18*  --   --   --   --  23 24  GLUCOSE 248*  --  249*  --   --  185* 126*  BUN 27*  --  35*  --   --  29* 36*  CREATININE 1.52*  --  1.40*  --   --  1.64* 1.64*  CALCIUM 8.9  --   --   --   --  8.8* 8.9  MG  --   --   --  2.0  --  1.9  --   PHOS  --   --   --   --   --  3.6  --    GFR Estimated Creatinine Clearance: 26.9 mL/min (A) (by C-G formula based on SCr of 1.64 mg/dL (H)). Liver Function Tests: Recent Labs  Lab 07/25/21 1922 07/26/21 0348  AST 34 31  ALT 22 22  ALKPHOS 43 32*  BILITOT 1.0 1.2  PROT 6.5 6.1*  ALBUMIN 4.0 3.5   No results for input(s): LIPASE, AMYLASE in the last 168 hours. No results for input(s): AMMONIA in the last 168 hours. Coagulation profile No results for input(s): INR, PROTIME in the last 168 hours.  CBC: Recent Labs  Lab 07/25/21 1922 07/25/21 1943 07/25/21 1946 07/26/21 0024 07/26/21 0348 07/27/21 0057  WBC 12.2*  --   --   --  7.7 18.7*   NEUTROABS 7.6  --   --   --  7.1  --   HGB 12.6* 12.9* 13.6 12.6* 11.7* 10.6*  HCT 40.0 38.0* 40.0 37.0* 35.2* 31.8*  MCV 103.9*  --   --   --  101.1* 98.1  PLT 259  --   --   --  196 175   Cardiac Enzymes: No results for input(s): CKTOTAL, CKMB, CKMBINDEX, TROPONINI in the last 168 hours. BNP (last 3 results) No results for input(s): PROBNP in the last 8760 hours. CBG: Recent Labs  Lab 07/26/21 1238 07/26/21 1747 07/26/21 2158 07/27/21 0751 07/27/21 1136  GLUCAP 143* 120* 134* 118* 111*   D-Dimer: No results for input(s): DDIMER in the last 72 hours. Hgb A1c: Recent Labs    07/26/21 0348  HGBA1C 5.4   Lipid Profile: No results for input(s): CHOL, HDL, LDLCALC, TRIG, CHOLHDL, LDLDIRECT in the last 72 hours. Thyroid function studies: No results for input(s): TSH, T4TOTAL, T3FREE, THYROIDAB in the last 72 hours.  Invalid input(s): FREET3 Anemia work up: No results for input(s): VITAMINB12, FOLATE, FERRITIN, TIBC, IRON, RETICCTPCT in the last 72 hours. Sepsis Labs: Recent Labs  Lab 07/25/21 1922 07/25/21 2309 07/25/21 2310 07/26/21 0348 07/27/21 0057  PROCALCITON  --  <0.10  --   --   --   WBC 12.2*  --   --  7.7 18.7*  LATICACIDVEN 3.5*  --  2.6* 1.8  --     Microbiology Recent Results (from the past 240 hour(s))  Resp Panel by RT-PCR (Flu A&B, Covid)     Status: None   Collection Time: 07/25/21  7:22 PM   Specimen: Nasopharyngeal(NP) swabs in vial transport medium  Result Value Ref Range Status   SARS Coronavirus 2 by RT PCR NEGATIVE NEGATIVE Final    Comment: (NOTE) SARS-CoV-2 target nucleic acids are NOT DETECTED.  The SARS-CoV-2 RNA is generally detectable in upper respiratory specimens during the acute phase of infection. The lowest concentration of SARS-CoV-2 viral copies this assay can detect is 138 copies/mL. A negative result does not preclude SARS-Cov-2 infection and should not be used as the sole basis for treatment or other patient  management decisions. A negative result may occur with  improper specimen collection/handling, submission of specimen other than nasopharyngeal swab, presence of viral mutation(s) within the areas targeted by this assay, and inadequate number of viral copies(<138 copies/mL). A negative result must be combined with clinical observations, patient history, and epidemiological information. The expected result is Negative.  Fact Sheet for Patients:  EntrepreneurPulse.com.au  Fact Sheet for Healthcare Providers:  IncredibleEmployment.be  This test is no t yet approved or cleared by the [place] FDA and  has been authorized for detection and/or diagnosis of SARS-CoV-2 by FDA under an Emergency Use Authorization (EUA). This EUA will remain  in effect (meaning this test can be used) for the duration of the COVID-19 declaration under Section 564(b)(1) of the Act, 21 U.S.C.section 360bbb-3(b)(1), unless the authorization is terminated  or revoked sooner.       Influenza A by PCR NEGATIVE NEGATIVE Final   Influenza B by PCR NEGATIVE NEGATIVE Final    Comment: (NOTE) The Xpert Xpress SARS-CoV-2/FLU/RSV plus assay is intended as an aid in the diagnosis of influenza from Nasopharyngeal swab specimens and should not be used as a sole basis for treatment. Nasal washings and aspirates are unacceptable for Xpert Xpress SARS-CoV-2/FLU/RSV testing.  Fact Sheet for Patients: EntrepreneurPulse.com.au  Fact Sheet for Healthcare Providers: IncredibleEmployment.be  This test is not yet approved or cleared by the [place] FDA  and has been authorized for detection and/or diagnosis of SARS-CoV-2 by FDA under an Emergency Use Authorization (EUA). This EUA will remain in effect (meaning this test can be used) for the duration of the COVID-19 declaration under Section 564(b)(1) of the Act, 21 U.S.C. section 360bbb-3(b)(1),  unless the authorization is terminated or revoked.  Performed at [facility], [place] [address] [place], [place] [address]   Blood culture (routine x 2)     Status: None (Preliminary result)   Collection Time: 07/25/21  7:35 PM   Specimen: BLOOD  Result Value Ref Range Status   Specimen Description BLOOD LEFT ANTECUBITAL  Final   Special Requests   Final    BOTTLES DRAWN AEROBIC AND ANAEROBIC Blood Culture adequate volume   Culture   Final    NO GROWTH 2 DAYS Performed at [facility], [place] [address] [place], [place] [address]    Report Status PENDING  Incomplete  Blood culture (routine x 2)     Status: None (Preliminary result)   Collection Time: 07/25/21  7:39 PM   Specimen: BLOOD LEFT HAND  Result Value Ref Range Status   Specimen Description BLOOD LEFT HAND  Final   Special Requests   Final    BOTTLES DRAWN AEROBIC AND ANAEROBIC Blood Culture adequate volume   Culture   Final    NO GROWTH 2 DAYS Performed at [facility], [place] [address], [place],  [address]    Report Status PENDING  Incomplete    Procedures and diagnostic studies:  CT Angio Chest PE W and/or Wo Contrast  Result Date: 07/25/2021 CLINICAL DATA:  PE suspected, high prob.  Dyspnea. EXAM: CT ANGIOGRAPHY CHEST WITH CONTRAST TECHNIQUE: Multidetector CT imaging of the chest was performed using the standard protocol during bolus administration of intravenous contrast. Multiplanar CT image reconstructions and MIPs were obtained to evaluate the vascular anatomy. CONTRAST:  39mL OMNIPAQUE IOHEXOL 350 MG/ML SOLN COMPARISON:  None. FINDINGS: Cardiovascular: There is adequate opacification the pulmonary arterial tree. No intraluminal filling defect identified to suggest acute pulmonary embolism. The central pulmonary arteries are of normal caliber. Moderate multi-vessel coronary artery calcification. Mild global cardiomegaly. No pericardial effusion. Mild atherosclerotic calcification within the  thoracic aorta. No aortic aneurysm. Mediastinum/Nodes: No enlarged mediastinal, hilar, or axillary lymph nodes. Thyroid gland, trachea, and esophagus demonstrate no significant findings. Lungs/Pleura: Small bilateral pleural effusions are present. Mild bibasilar smooth interlobular septal thickening is in keeping with trace interstitial pulmonary edema. Mild bibasilar atelectasis. No pneumothorax. Central airways are widely patent. Upper Abdomen: No acute abnormality. Musculoskeletal: No acute bone abnormality. Multiple healed and internally fixed right rib fractures noted. No acute bone abnormality. No lytic or blastic bone lesion. Accentuated thoracic kyphosis noted. Review of the MIP images confirms the above findings. IMPRESSION: No pulmonary embolism. Trace interstitial pulmonary edema and small bilateral pleural effusions, possibly reflecting changes of mild cardiogenic failure. Mild global cardiomegaly. Moderate coronary artery calcification. Aortic Atherosclerosis (ICD10-I70.0). Electronically Signed   By: Fidela Salisbury M.D.   On: 07/25/2021 21:55   DG Chest Port 1 View  Result Date: 07/25/2021 CLINICAL DATA:  Shortness of breath. EXAM: PORTABLE CHEST 1 VIEW COMPARISON:  05/13/2018 FINDINGS: Mild cardiac enlargement with mild pulmonary vascular congestion. Small pleural effusions are suggested. No definite edema or consolidation. Old right rib fractures with postoperative change. Calcification of the aorta. IMPRESSION: Cardiac enlargement with mild vascular congestion and small pleural effusions. Electronically Signed   By: Oren Beckmann.D.  On: 07/25/2021 20:28   ECHOCARDIOGRAM COMPLETE  Result Date: 07/26/2021    ECHOCARDIOGRAM REPORT   Patient Name:   Brandon Robles Date of Exam: 07/26/2021 Medical Rec #:  [id number]   Height:       67.0 in Accession #:    [id number]  Weight:       164.9 lb Date of Birth:  27-Oct-1925  BSA:          1.863 m Patient Age:    22 years    BP:           111/57 mmHg  Patient Gender: M           HR:           55 bpm. Exam Location:  Inpatient Procedure: 2D Echo, Cardiac Doppler, Color Doppler and Intracardiac            Opacification Agent Indications:    CHF-Acute Systolic F62.13  History:        Patient has no prior history of Echocardiogram examinations. CAD                 and Previous Myocardial Infarction; Prior CABG.  Sonographer:    Merrie Roof RDCS Referring Phys: [id number] [place]  1. Left ventricular ejection fraction, by estimation, is 30 to 35%. The left ventricle has moderately decreased function. The left ventricle demonstrates regional wall motion abnormalities (see scoring diagram/findings for description). Left ventricular  diastolic function could not be evaluated.  2. Right ventricular systolic function is normal. The right ventricular size is mildly enlarged. There is severely elevated pulmonary artery systolic pressure. The estimated right ventricular systolic pressure is 69.6 mmHg.  3. Left atrial size was mild to moderately dilated.  4. Right atrial size was mild to moderately dilated.  5. The mitral valve is normal in structure. Moderate mitral valve regurgitation.  6. Tricuspid valve regurgitation is moderate to severe.  7. The aortic valve is tricuspid. There is mild calcification of the aortic valve. There is mild thickening of the aortic valve. Aortic valve regurgitation is mild. Mild aortic valve sclerosis is present, with no evidence of aortic valve stenosis.  8. The inferior vena cava is normal in size with <50% respiratory variability, suggesting right atrial pressure of 8 mmHg. Comparison(s): No prior Echocardiogram. Conclusion(s)/Recommendation(s): LVEF reduced, with focal wall motion abnormalities as noted. Slow echo contrast movement at the LV apex, but no thrombus seen. Severely elevated right sided filling pressures. FINDINGS  Left Ventricle: Left ventricular ejection fraction, by estimation, is 30 to 35%. The left  ventricle has moderately decreased function. The left ventricle demonstrates regional wall motion abnormalities. Definity contrast agent was given IV to delineate the left ventricular endocardial borders. The left ventricular internal cavity size was normal in size. There is no left ventricular hypertrophy. Left ventricular diastolic function could not be evaluated.  LV Wall Scoring: The apical septal segment, apical inferior segment, and apex are akinetic. The inferior wall, mid anteroseptal segment, apical lateral segment, mid inferoseptal segment, and apical anterior segment are hypokinetic. The anterior wall, antero-lateral wall, posterior wall, basal anteroseptal segment, and basal inferoseptal segment are normal. Right Ventricle: The right ventricular size is mildly enlarged. Right vetricular wall thickness was not well visualized. Right ventricular systolic function is normal. There is severely elevated pulmonary artery systolic pressure. The tricuspid regurgitant velocity is 3.70 m/s, and with an assumed right atrial pressure of 8 mmHg, the estimated right ventricular systolic pressure is 29.5 mmHg. Left  Atrium: Left atrial size was mild to moderately dilated. Right Atrium: Right atrial size was mild to moderately dilated. Pericardium: There is no evidence of pericardial effusion. Mitral Valve: The mitral valve is normal in structure. Moderate mitral valve regurgitation. Tricuspid Valve: The tricuspid valve is normal in structure. Tricuspid valve regurgitation is moderate to severe. No evidence of tricuspid stenosis. Aortic Valve: The aortic valve is tricuspid. There is mild calcification of the aortic valve. There is mild thickening of the aortic valve. Aortic valve regurgitation is mild. Aortic regurgitation PHT measures 545 msec. Mild aortic valve sclerosis is present, with no evidence of aortic valve stenosis. Aortic valve mean gradient measures 4.0 mmHg. Aortic valve peak gradient measures 8.1 mmHg.  Aortic valve area, by VTI measures 1.80 cm. Pulmonic Valve: The pulmonic valve was grossly normal. Pulmonic valve regurgitation is mild. No evidence of pulmonic stenosis. Aorta: The aortic root and ascending aorta are structurally normal, with no evidence of dilitation. Venous: The inferior vena cava is normal in size with less than 50% respiratory variability, suggesting right atrial pressure of 8 mmHg. IAS/Shunts: The atrial septum is grossly normal.  LEFT VENTRICLE PLAX 2D LVIDd:         5.00 cm LVIDs:         3.60 cm LV PW:         0.90 cm LV IVS:        1.05 cm LVOT diam:     2.00 cm LV SV:         56 LV SV Index:   30 LVOT Area:     3.14 cm  LV Volumes (MOD) LV vol d, MOD A2C: 84.5 ml LV vol d, MOD A4C: 133.0 ml LV vol s, MOD A2C: 55.0 ml LV vol s, MOD A4C: 96.0 ml LV SV MOD A2C:     29.5 ml LV SV MOD A4C:     133.0 ml LV SV MOD BP:      24.4 ml RIGHT VENTRICLE          IVC RV Basal diam:  4.10 cm  IVC diam: 1.90 cm RV Mid diam:    3.10 cm LEFT ATRIUM           Index        RIGHT ATRIUM           Index LA diam:      3.20 cm 1.72 cm/m   RA Area:     19.40 cm LA Vol (A2C): 80.2 ml 43.05 ml/m  RA Volume:   55.60 ml  29.84 ml/m LA Vol (A4C): 51.4 ml 27.59 ml/m  AORTIC VALVE AV Area (Vmax):    2.16 cm AV Area (Vmean):   1.98 cm AV Area (VTI):     1.80 cm AV Vmax:           142.00 cm/s AV Vmean:          91.100 cm/s AV VTI:            0.313 m AV Peak Grad:      8.1 mmHg AV Mean Grad:      4.0 mmHg LVOT Vmax:         97.80 cm/s LVOT Vmean:        57.500 cm/s LVOT VTI:          0.179 m LVOT/AV VTI ratio: 0.57 AI PHT:            545 msec  AORTA Ao Root diam: 3.50 cm TRICUSPID  VALVE TR Peak grad:   54.8 mmHg TR Vmax:        370.00 cm/s  SHUNTS Systemic VTI:  0.18 m Systemic Diam: 2.00 cm Buford Dresser MD Electronically signed by Buford Dresser MD Signature Date/Time: 07/26/2021/6:06:10 PM    Final     Medications:    furosemide  20 mg Intravenous BID   guaiFENesin  600 mg Oral BID    Continuous Infusions:   LOS: 2 days   Geradine Girt  [facility]   How to contact the [facility] Attending or Consulting provider [place] or covering provider during after hours [place], for this patient?  Check the care team in [facility] and look for a) attending/consulting [facility] provider listed and b) the [facility] team listed Log into [email or web address] and use [place]'s universal password to access. If you do not have the password, please contact the hospital operator. Locate the [facility] provider you are looking for under [facility] and page to a number that you can be directly reached. If you still have difficulty reaching the provider, please page the [facility] (Director on Call) for the Hospitalists listed on amion for assistance.  07/27/2021, 12:30 PM

## 2021-07-28 ENCOUNTER — Other Ambulatory Visit (HOSPITAL_COMMUNITY): Payer: Self-pay

## 2021-07-28 LAB — CBC
HCT: 31.1 % — ABNORMAL LOW (ref 39.0–52.0)
Hemoglobin: 10.3 g/dL — ABNORMAL LOW (ref 13.0–17.0)
MCH: 33 pg (ref 26.0–34.0)
MCHC: 33.1 g/dL (ref 30.0–36.0)
MCV: 99.7 fL (ref 80.0–100.0)
Platelets: 170 10*3/uL (ref 150–400)
RBC: 3.12 MIL/uL — ABNORMAL LOW (ref 4.22–5.81)
RDW: 14.6 % (ref 11.5–15.5)
WBC: 10.1 10*3/uL (ref 4.0–10.5)
nRBC: 0 % (ref 0.0–0.2)

## 2021-07-28 LAB — GLUCOSE, CAPILLARY
Glucose-Capillary: 94 mg/dL (ref 70–99)
Glucose-Capillary: 104 mg/dL — ABNORMAL HIGH (ref 70–99)

## 2021-07-28 LAB — BASIC METABOLIC PANEL
Anion gap: 8 (ref 5–15)
BUN: 44 mg/dL — ABNORMAL HIGH (ref 8–23)
CO2: 28 mmol/L (ref 22–32)
Calcium: 8.5 mg/dL — ABNORMAL LOW (ref 8.9–10.3)
Chloride: 99 mmol/L (ref 98–111)
Creatinine, Ser: 1.65 mg/dL — ABNORMAL HIGH (ref 0.61–1.24)
GFR, Estimated: 39 mL/min — ABNORMAL LOW (ref >60)
Glucose, Bld: 105 mg/dL — ABNORMAL HIGH (ref 70–99)
Potassium: 3.8 mmol/L (ref 3.5–5.1)
Sodium: 135 mmol/L (ref 135–145)

## 2021-07-28 LAB — PROCALCITONIN: Procalcitonin: <0.1 ng/mL

## 2021-07-28 MED ORDER — FUROSEMIDE 20 MG PO TABS: 20.0000 mg | ORAL_TABLET | Freq: Every day | ORAL | Status: DC

## 2021-07-28 MED ORDER — FUROSEMIDE 20 MG PO TABS
20.0000 mg | ORAL_TABLET | Freq: Every day | ORAL | 1 refills | Status: DC
  Filled 2021-07-28: qty 30, 30d supply, fill #0

## 2021-07-28 NOTE — Care Management (Signed)
1141 07-28-21 Case Manager received a referral for durable medical equipment oxygen. Case Manager spoke with patient and he stated to order; however, he may not wear it. Educated the patient regarding respiratory distress if oxygen is not in use. Case Manager also called the son to make him aware that the patient is requiring oxygen for home. Case Manager ordered oxygen via [employer] and it will be be delivered to the room prior to transition home. No further home needs identified.

## 2021-07-28 NOTE — Discharge Summary (Signed)
Discharge Summary  [name] OVF:[id number] DOB: 1926/06/06  PCP: Lavone Orn, MD  Admit date: 07/25/2021 Discharge date: 07/28/2021  Time spent: 74mins, more than 50% time spent on coordination of care, son updated over the phone  Recommendations for Outpatient Follow-up:  F/u with PCP within a week  for hospital discharge follow up, repeat cbc/bmp at follow up  2.  F/u with cardiology Dr Tamala Julian as scheduled   3. Home o2 with activity 4. Patient is advised to check blood pressure at home and bring in record to hospital discharge follow up appointment   New meds : lasix Discontinued lisinopril/HCTZ    Discharge Diagnoses:  Active Hospital Problems   Diagnosis Date Noted   Acute respiratory failure with hypoxia ([place]) 07/25/2021   Acute on chronic diastolic CHF (congestive heart failure) ([place]) 07/26/2021   Elevated troponin 84/16/6063   Metabolic acidosis 01/60/1093   Lactic acidosis 07/26/2021   Hyperglycemia 07/26/2021   CKD (chronic kidney disease) stage 3, GFR 30-59 ml/min ([place]) 07/26/2021   Hypertension 10/24/2019    Resolved Hospital Problems  No resolved problems to display.    Discharge Condition: stable  Diet recommendation: heart healthy  Filed Weights   07/27/21 0458 07/28/21 0500 07/28/21 2355  Weight: 72.1 kg 72 kg 71.8 kg    History of present illness: ( per admitting MD Dr Velia Meyer) Chief Complaint: Shortness of breath   HPI: Brandon Robles is a 87 y.o. male with medical history significant for coronary artery disease status post stent to RCA in 2001, stent to proximal LAD in 2007, high-grade AV block, chronic diastolic heart failure, hypertension, CKD 3 AAA with baseline creatinine 1.3-1.5, who is admitted to [facility] on 07/25/2021 with acute [place] respiratory failure in the setting of acute on chronic diastolic heart failure after presenting from home to [facility] ED complaining of shortness of breath ED Course:  Vital signs in the ED were notable  for the following: Afebrile; heart rate 56-72; blood pressure 104/70 -129/62; respiratory rate initially 31-32, before subsequently improving into the range of 18-20 following pharmacologic interventions as found below; initial oxygen saturation noted to be 87% on room air.  Patient was initially put on BiPAP, however was separately taken off around2130 on 07/25/2021, subsequently been satting in the high 90s on 3 to 4 L nasal cannula.   Hospital Course:  Principal Problem:   Acute respiratory failure with hypoxia (HCC) Active Problems:   Hypertension   Acute on chronic diastolic CHF (congestive heart failure) (HCC)   Elevated troponin   Metabolic acidosis   Lactic acidosis   Hyperglycemia   CKD (chronic kidney disease) stage 3, GFR 30-59 ml/min (HCC)   Acute hypoxic respiratory failure due to acute on chronic systolic chf -Echo shows EF of 30-35%, with apical inferior wall and apex being akinetic, anteroseptal, inferoseptal and apical anterior wall hypokinetic. Severely elevated PA pressure, moderate bi-atrial enlargement, mildly enlarged RV , moderate to severe TR. No prior echo found to compare.  -CTA chest negative for PE, does show  small bilateral pleural effusions.  -BNP 742  -seen by cardiology, no plan for inpatient work up, he improved with iv lasix, cardiology recommend discharge home with oral lasxi, hold aceI due to soft bp. Unable to use BB due to bradycardia -f/u with cardiology outpatient  He ambulated with PT, no hypoxia at rest, o2 dropped to 81 when going up stairs, he does lives in a split level house, arrange home o2 with acitivity  Elevated Troponin/CAD  with prior stenting to RCA '01: 32>>286>>456. No chest pain PTA. Suspect demand ischemia in the setting of CHF.   High degree AVB: hx of the same, followed by EP for many years without indication for PPM. 2nd degree AVB type 1, intermittent 3rd degree AVB. Not on BB. Denies any syncope PTA.  -- follow up with EP  outpatient   HTN:  Per cardiology recommendation, home bp meds lisinopril/HCTZ discontinued due to soft bp, he is discharged on oral lasix  F/u with cardiology  CKDIIIb Cr appear at baseline, renal dosing meds  CODE status: DNR  Disposition: home with family, he did well with PT, he does not need home health PT, son reports patient still drives   Procedures: none  Consultations: Cardiology   Discharge Exam: BP (!) 132/50 (BP Location: Right Arm)   Pulse 60   Temp (!) 97.4 F (36.3 C) (Oral)   Resp 19   Ht 5\' 7"  (1.702 m)   Wt 71.8 kg   SpO2 95%   BMI 24.81 kg/m   General: NAD, hard of hearing, appear younger than stated age Cardiovascular: RRR Respiratory: normal respiratory effort   Discharge Instructions You were cared for by a hospitalist during your hospital stay. If you have any questions about your discharge medications or the care you received while you were in the hospital after you are discharged, you can call the unit and asked to speak with the hospitalist on call if the hospitalist that took care of you is not available. Once you are discharged, your primary care physician will handle any further medical issues. Please note that NO REFILLS for any discharge medications will be authorized once you are discharged, as it is imperative that you return to your primary care physician (or establish a relationship with a primary care physician if you do not have one) for your aftercare needs so that they can reassess your need for medications and monitor your lab values.  Discharge Instructions     Diet - low sodium heart healthy   Complete by: As directed    Increase activity slowly   Complete by: As directed       Allergies as of 07/28/2021       Reactions   Vitamin D Analogs Other (See Comments)   "pins and needles feeling"   Fish Oil Other (See Comments)   Ambien [zolpidem Tartrate] Other (See Comments)   Confusion   Honey Other (See Comments)   "pins  and needles feeling"        Medication List     STOP taking these medications    lisinopril-hydrochlorothiazide 10-12.5 MG tablet Commonly known as: ZESTORETIC       TAKE these medications    acetaminophen 325 MG tablet Commonly known as: TYLENOL Take 2 tablets (650 mg total) by mouth every 4 (four) hours as needed for mild pain.   amoxicillin 500 MG capsule Commonly known as: AMOXIL Take 4 capsules by mouth as needed 1 hour prior to dental procedures.   aspirin EC 81 MG tablet Take 81 mg by mouth daily.   co-enzyme Q-10 30 MG capsule Take 30 mg by mouth daily.   FISHERMANS FRIEND MT Use as directed 1 lozenge in the mouth or throat 2 (two) times daily as needed (congestion).   furosemide 20 MG tablet Commonly known as: LASIX Take 1 tablet (20 mg total) by mouth daily. Start taking on: July 29, 2021   nitroGLYCERIN 0.4 MG SL tablet Commonly known as:  NITROSTAT Place 0.4 mg under the tongue every 5 (five) minutes as needed for chest pain. (MAXIMUM of 3 TABLETS)   OVER THE COUNTER MEDICATION Take 1 capsule by mouth daily. OMEGA Q PLUS   OVER THE COUNTER MEDICATION Take 2 capsules by mouth daily. OTC [place] capsules   phenylephrine 10 MG Tabs tablet Commonly known as: SUDAFED PE Take 10 mg by mouth every 6 (six) hours as needed (nasal congestion).   Red Yeast Rice 600 MG Caps Take 600 mg by mouth daily.   sodium chloride 0.65 % Soln nasal spray Commonly known as: OCEAN Place 1 spray into both nostrils daily as needed for congestion.   temazepam 30 MG capsule Commonly known as: RESTORIL Take 30 mg by mouth at bedtime.               Durable Medical Equipment  (From admission, onward)           Start     Ordered   07/28/21 1119  For home use only DME oxygen  Once       Comments: Home o2 2liter with activity  Question Answer Comment  Length of Need Lifetime   Mode or (Route) Nasal cannula   Liters per Minute 2   Frequency Continuous  (stationary and portable oxygen unit needed)   Oxygen conserving device Yes   Oxygen delivery system Gas      07/28/21 1119           Allergies  Allergen Reactions   Vitamin D Analogs Other (See Comments)    "pins and needles feeling"   Fish Oil Other (See Comments)   Ambien [Zolpidem Tartrate] Other (See Comments)    Confusion   Honey Other (See Comments)    "pins and needles feeling"    Follow-up Information     [facility] Follow up.   Why: Oxygen to be delivered to the room prior to transition home. Contact information: [place] [address] [phone]         Lavone Orn, MD Follow up in 1 week(s).   Specialty: Internal Medicine Why: pleaase check your blood pressure at home, bring in blood pressure record for you pcp and cardiologist to reviw Contact information: [address] [place] [address] [phone]         Belva Crome, MD .   Specialty: Cardiology Contact information: [phone] [address] [place] [address] [phone]                  The results of significant diagnostics from this hospitalization (including imaging, microbiology, ancillary and laboratory) are listed below for reference.    Significant Diagnostic Studies: CT Angio Chest PE W and/or Wo Contrast  Result Date: 07/25/2021 CLINICAL DATA:  PE suspected, high prob.  Dyspnea. EXAM: CT ANGIOGRAPHY CHEST WITH CONTRAST TECHNIQUE: Multidetector CT imaging of the chest was performed using the standard protocol during bolus administration of intravenous contrast. Multiplanar CT image reconstructions and MIPs were obtained to evaluate the vascular anatomy. CONTRAST:  70mL OMNIPAQUE IOHEXOL 350 MG/ML SOLN COMPARISON:  None. FINDINGS: Cardiovascular: There is adequate opacification the pulmonary arterial tree. No intraluminal filling defect identified to suggest acute pulmonary embolism. The central pulmonary  arteries are of normal caliber. Moderate multi-vessel coronary artery calcification. Mild global cardiomegaly. No pericardial effusion. Mild atherosclerotic calcification within the thoracic aorta. No aortic aneurysm. Mediastinum/Nodes: No enlarged mediastinal, hilar, or axillary lymph nodes. Thyroid gland, trachea, and  esophagus demonstrate no significant findings. Lungs/Pleura: Small bilateral pleural effusions are present. Mild bibasilar smooth interlobular septal thickening is in keeping with trace interstitial pulmonary edema. Mild bibasilar atelectasis. No pneumothorax. Central airways are widely patent. Upper Abdomen: No acute abnormality. Musculoskeletal: No acute bone abnormality. Multiple healed and internally fixed right rib fractures noted. No acute bone abnormality. No lytic or blastic bone lesion. Accentuated thoracic kyphosis noted. Review of the MIP images confirms the above findings. IMPRESSION: No pulmonary embolism. Trace interstitial pulmonary edema and small bilateral pleural effusions, possibly reflecting changes of mild cardiogenic failure. Mild global cardiomegaly. Moderate coronary artery calcification. Aortic Atherosclerosis (ICD10-I70.0). Electronically Signed   By: Fidela Salisbury M.D.   On: 07/25/2021 21:55   DG Chest Port 1 View  Result Date: 07/25/2021 CLINICAL DATA:  Shortness of breath. EXAM: PORTABLE CHEST 1 VIEW COMPARISON:  05/13/2018 FINDINGS: Mild cardiac enlargement with mild pulmonary vascular congestion. Small pleural effusions are suggested. No definite edema or consolidation. Old right rib fractures with postoperative change. Calcification of the aorta. IMPRESSION: Cardiac enlargement with mild vascular congestion and small pleural effusions. Electronically Signed   By: Lucienne Capers M.D.   On: 07/25/2021 20:28   ECHOCARDIOGRAM COMPLETE  Result Date: 07/26/2021    ECHOCARDIOGRAM REPORT   Patient Name:   Brandon Robles Date of Exam: 07/26/2021 Medical Rec #:   [id number]   Height:       67.0 in Accession #:    [id number]  Weight:       164.9 lb Date of Birth:  [birth year]  BSA:          1.863 m Patient Age:    89 years    BP:           111/57 mmHg Patient Gender: M           HR:           55 bpm. Exam Location:  Inpatient Procedure: 2D Echo, Cardiac Doppler, Color Doppler and Intracardiac            Opacification Agent Indications:    CHF-Acute Systolic E95.28  History:        Patient has no prior history of Echocardiogram examinations. CAD                 and Previous Myocardial Infarction; Prior CABG.  Sonographer:    Merrie Roof RDCS Referring Phys: [id number] [place]  1. Left ventricular ejection fraction, by estimation, is 30 to 35%. The left ventricle has moderately decreased function. The left ventricle demonstrates regional wall motion abnormalities (see scoring diagram/findings for description). Left ventricular  diastolic function could not be evaluated.  2. Right ventricular systolic function is normal. The right ventricular size is mildly enlarged. There is severely elevated pulmonary artery systolic pressure. The estimated right ventricular systolic pressure is 10.2 mmHg.  3. Left atrial size was mild to moderately dilated.  4. Right atrial size was mild to moderately dilated.  5. The mitral valve is normal in structure. Moderate mitral valve regurgitation.  6. Tricuspid valve regurgitation is moderate to severe.  7. The aortic valve is tricuspid. There is mild calcification of the aortic valve. There is mild thickening of the aortic valve. Aortic valve regurgitation is mild. Mild aortic valve sclerosis is present, with no evidence of aortic valve stenosis.  8. The inferior vena cava is normal in size with <50% respiratory variability, suggesting right atrial pressure of 8 mmHg. Comparison(s): No prior Echocardiogram. Conclusion(s)/Recommendation(s): LVEF reduced,  with focal wall motion abnormalities as noted. Slow echo contrast movement at  the LV apex, but no thrombus seen. Severely elevated right sided filling pressures. FINDINGS  Left Ventricle: Left ventricular ejection fraction, by estimation, is 30 to 35%. The left ventricle has moderately decreased function. The left ventricle demonstrates regional wall motion abnormalities. Definity contrast agent was given IV to delineate the left ventricular endocardial borders. The left ventricular internal cavity size was normal in size. There is no left ventricular hypertrophy. Left ventricular diastolic function could not be evaluated.  LV Wall Scoring: The apical septal segment, apical inferior segment, and apex are akinetic. The inferior wall, mid anteroseptal segment, apical lateral segment, mid inferoseptal segment, and apical anterior segment are hypokinetic. The anterior wall, antero-lateral wall, posterior wall, basal anteroseptal segment, and basal inferoseptal segment are normal. Right Ventricle: The right ventricular size is mildly enlarged. Right vetricular wall thickness was not well visualized. Right ventricular systolic function is normal. There is severely elevated pulmonary artery systolic pressure. The tricuspid regurgitant velocity is 3.70 m/s, and with an assumed right atrial pressure of 8 mmHg, the estimated right ventricular systolic pressure is 26.8 mmHg. Left Atrium: Left atrial size was mild to moderately dilated. Right Atrium: Right atrial size was mild to moderately dilated. Pericardium: There is no evidence of pericardial effusion. Mitral Valve: The mitral valve is normal in structure. Moderate mitral valve regurgitation. Tricuspid Valve: The tricuspid valve is normal in structure. Tricuspid valve regurgitation is moderate to severe. No evidence of tricuspid stenosis. Aortic Valve: The aortic valve is tricuspid. There is mild calcification of the aortic valve. There is mild thickening of the aortic valve. Aortic valve regurgitation is mild. Aortic regurgitation PHT measures 545  msec. Mild aortic valve sclerosis is present, with no evidence of aortic valve stenosis. Aortic valve mean gradient measures 4.0 mmHg. Aortic valve peak gradient measures 8.1 mmHg. Aortic valve area, by VTI measures 1.80 cm. Pulmonic Valve: The pulmonic valve was grossly normal. Pulmonic valve regurgitation is mild. No evidence of pulmonic stenosis. Aorta: The aortic root and ascending aorta are structurally normal, with no evidence of dilitation. Venous: The inferior vena cava is normal in size with less than 50% respiratory variability, suggesting right atrial pressure of 8 mmHg. IAS/Shunts: The atrial septum is grossly normal.  LEFT VENTRICLE PLAX 2D LVIDd:         5.00 cm LVIDs:         3.60 cm LV PW:         0.90 cm LV IVS:        1.05 cm LVOT diam:     2.00 cm LV SV:         56 LV SV Index:   30 LVOT Area:     3.14 cm  LV Volumes (MOD) LV vol d, MOD A2C: 84.5 ml LV vol d, MOD A4C: 133.0 ml LV vol s, MOD A2C: 55.0 ml LV vol s, MOD A4C: 96.0 ml LV SV MOD A2C:     29.5 ml LV SV MOD A4C:     133.0 ml LV SV MOD BP:      24.4 ml RIGHT VENTRICLE          IVC RV Basal diam:  4.10 cm  IVC diam: 1.90 cm RV Mid diam:    3.10 cm LEFT ATRIUM           Index        RIGHT ATRIUM  Index LA diam:      3.20 cm 1.72 cm/m   RA Area:     19.40 cm LA Vol (A2C): 80.2 ml 43.05 ml/m  RA Volume:   55.60 ml  29.84 ml/m LA Vol (A4C): 51.4 ml 27.59 ml/m  AORTIC VALVE AV Area (Vmax):    2.16 cm AV Area (Vmean):   1.98 cm AV Area (VTI):     1.80 cm AV Vmax:           142.00 cm/s AV Vmean:          91.100 cm/s AV VTI:            0.313 m AV Peak Grad:      8.1 mmHg AV Mean Grad:      4.0 mmHg LVOT Vmax:         97.80 cm/s LVOT Vmean:        57.500 cm/s LVOT VTI:          0.179 m LVOT/AV VTI ratio: 0.57 AI PHT:            545 msec  AORTA Ao Root diam: 3.50 cm TRICUSPID VALVE TR Peak grad:   54.8 mmHg TR Vmax:        370.00 cm/s  SHUNTS Systemic VTI:  0.18 m Systemic Diam: 2.00 cm Buford Dresser MD Electronically  signed by Buford Dresser MD Signature Date/Time: 07/26/2021/6:06:10 PM    Final     Microbiology: Recent Results (from the past 240 hour(s))  Resp Panel by RT-PCR (Flu A&B, Covid)     Status: None   Collection Time: 07/25/21  7:22 PM   Specimen: Nasopharyngeal(NP) swabs in vial transport medium  Result Value Ref Range Status   SARS Coronavirus 2 by RT PCR NEGATIVE NEGATIVE Final    Comment: (NOTE) SARS-CoV-2 target nucleic acids are NOT DETECTED.  The SARS-CoV-2 RNA is generally detectable in upper respiratory specimens during the acute phase of infection. The lowest concentration of SARS-CoV-2 viral copies this assay can detect is 138 copies/mL. A negative result does not preclude SARS-Cov-2 infection and should not be used as the sole basis for treatment or other patient management decisions. A negative result may occur with  improper specimen collection/handling, submission of specimen other than nasopharyngeal swab, presence of viral mutation(s) within the areas targeted by this assay, and inadequate number of viral copies(<138 copies/mL). A negative result must be combined with clinical observations, patient history, and epidemiological information. The expected result is Negative.  Fact Sheet for Patients:  EntrepreneurPulse.com.au  Fact Sheet for Healthcare Providers:  IncredibleEmployment.be  This test is no t yet approved or cleared by the [place] FDA and  has been authorized for detection and/or diagnosis of SARS-CoV-2 by FDA under an Emergency Use Authorization (EUA). This EUA will remain  in effect (meaning this test can be used) for the duration of the COVID-19 declaration under Section 564(b)(1) of the Act, 21 U.S.C.section 360bbb-3(b)(1), unless the authorization is terminated  or revoked sooner.       Influenza A by PCR NEGATIVE NEGATIVE Final   Influenza B by PCR NEGATIVE NEGATIVE Final    Comment: (NOTE) The  Xpert Xpress SARS-CoV-2/FLU/RSV plus assay is intended as an aid in the diagnosis of influenza from Nasopharyngeal swab specimens and should not be used as a sole basis for treatment. Nasal washings and aspirates are unacceptable for Xpert Xpress SARS-CoV-2/FLU/RSV testing.  Fact Sheet for Patients: EntrepreneurPulse.com.au  Fact Sheet for Healthcare Providers: IncredibleEmployment.be  This test  is not yet approved or cleared by the [place] and has been authorized for detection and/or diagnosis of SARS-CoV-2 by FDA under an Emergency Use Authorization (EUA). This EUA will remain in effect (meaning this test can be used) for the duration of the COVID-19 declaration under Section 564(b)(1) of the Act, 21 U.S.C. section 360bbb-3(b)(1), unless the authorization is terminated or revoked.  Performed at [facility], [place] [address], [place], [place] [address]   Blood culture (routine x 2)     Status: None (Preliminary result)   Collection Time: 07/25/21  7:35 PM   Specimen: BLOOD  Result Value Ref Range Status   Specimen Description BLOOD LEFT ANTECUBITAL  Final   Special Requests   Final    BOTTLES DRAWN AEROBIC AND ANAEROBIC Blood Culture adequate volume   Culture   Final    NO GROWTH 3 DAYS Performed at [facility], [place] [address] [place], [place] [address]    Report Status PENDING  Incomplete  Blood culture (routine x 2)     Status: None (Preliminary result)   Collection Time: 07/25/21  7:39 PM   Specimen: BLOOD LEFT HAND  Result Value Ref Range Status   Specimen Description BLOOD LEFT HAND  Final   Special Requests   Final    BOTTLES DRAWN AEROBIC AND ANAEROBIC Blood Culture adequate volume   Culture   Final    NO GROWTH 3 DAYS Performed at [facility], [place] [address], [place], [place] [address]    Report Status PENDING  Incomplete     Labs: Basic Metabolic Panel: Recent Labs  Lab 07/25/21 1922  07/25/21 1943 07/25/21 1946 07/25/21 2309 07/26/21 0024 07/26/21 0348 07/27/21 0057 07/28/21 0237  NA 138   < > 139  --  139 139 137 135  K 4.8   < > 4.8  --  3.7 4.3 3.7 3.8  CL 104  --  108  --   --  106 102 99  CO2 18*  --   --   --   --  23 24 28   GLUCOSE 248*  --  249*  --   --  185* 126* 105*  BUN 27*  --  35*  --   --  29* 36* 44*  CREATININE 1.52*  --  1.40*  --   --  1.64* 1.64* 1.65*  CALCIUM 8.9  --   --   --   --  8.8* 8.9 8.5*  MG  --   --   --  2.0  --  1.9  --   --   PHOS  --   --   --   --   --  3.6  --   --    < > = values in this interval not displayed.   Liver Function Tests: Recent Labs  Lab 07/25/21 1922 07/26/21 0348  AST 34 31  ALT 22 22  ALKPHOS 43 32*  BILITOT 1.0 1.2  PROT 6.5 6.1*  ALBUMIN 4.0 3.5   No results for input(s): LIPASE, AMYLASE in the last 168 hours. No results for input(s): AMMONIA in the last 168 hours. CBC: Recent Labs  Lab 07/25/21 1922 07/25/21 1943 07/25/21 1946 07/26/21 0024 07/26/21 0348 07/27/21 0057 07/28/21 0237  WBC 12.2*  --   --   --  7.7 18.7* 10.1  NEUTROABS 7.6  --   --   --  7.1  --   --  HGB 12.6*   < > 13.6 12.6* 11.7* 10.6* 10.3*  HCT 40.0   < > 40.0 37.0* 35.2* 31.8* 31.1*  MCV 103.9*  --   --   --  101.1* 98.1 99.7  PLT 259  --   --   --  196 175 170   < > = values in this interval not displayed.   Cardiac Enzymes: No results for input(s): CKTOTAL, CKMB, CKMBINDEX, TROPONINI in the last 168 hours. BNP: BNP (last 3 results) Recent Labs    07/25/21 1922  BNP 742.7*    ProBNP (last 3 results) No results for input(s): PROBNP in the last 8760 hours.  CBG: Recent Labs  Lab 07/27/21 1136 07/27/21 1656 07/27/21 2045 07/28/21 0751 07/28/21 1126  GLUCAP 111* 108* 103* 94 104*       Signed:  Florencia Reasons MD, PhD, FACP  [facility] 07/28/2021, 12:45 PM

## 2021-07-28 NOTE — Progress Notes (Addendum)
Progress Note  Patient Name: Brandon Robles Date of Encounter: 07/28/2021  [facility] Cardiologist: Sinclair Grooms, MD   Subjective   No complaints this morning. Breathing is better, no longer on 02.  Inpatient Medications    Scheduled Meds:  [START ON 07/29/2021] furosemide  20 mg Oral Daily   guaiFENesin  600 mg Oral BID   temazepam  30 mg Oral QHS   Continuous Infusions:  PRN Meds: acetaminophen **OR** acetaminophen, albuterol, melatonin, sodium chloride   Vital Signs    Vitals:   07/28/21 0100 07/28/21 0500 07/28/21 0607 07/28/21 0744  BP: (!) 124/49 (!) 90/36 137/71 (!) 118/49  Pulse: (!) 58 (!) 54 62 64  Resp: 17 19 17 17   Temp: 98.2 F (36.8 C) 98.5 F (36.9 C) 98.5 F (36.9 C) 97.8 F (36.6 C)  TempSrc: Oral Oral Oral Oral  SpO2: 99% 94% 97% 96%  Weight:  72 kg 71.8 kg   Height:        Intake/Output Summary (Last 24 hours) at 07/28/2021 0922 Last data filed at 07/28/2021 0108 Gross per 24 hour  Intake --  Output 1450 ml  Net -1450 ml   Last 3 Weights 07/28/2021 07/28/2021 07/27/2021  Weight (lbs) 158 lb 6.4 oz 158 lb 11.7 oz 159 lb  Weight (kg) 71.85 kg 72 kg 72.122 kg      Telemetry    2nd degree AVB type1, appears to have intermittent 3ed degree AVB as well - Personally Reviewed  ECG    No new tracing  Physical Exam   GEN: No acute distress.   Neck: No JVD Cardiac: Loletha Grayer, soft systolic murmur, no rubs, or gallops.  Respiratory: Clear to auscultation bilaterally. GI: Soft, nontender, non-distended  MS: No edema; No deformity. Neuro:  Nonfocal  Psych: Normal affect   Labs    High Sensitivity Troponin:   Recent Labs  Lab 07/25/21 1922 07/25/21 2309 07/26/21 0736  TROPONINIHS 32* 286* 456*     Chemistry Recent Labs  Lab 07/25/21 1922 07/25/21 1943 07/25/21 2309 07/26/21 0024 07/26/21 0348 07/27/21 0057 07/28/21 0237  NA 138   < >  --    < > 139 137 135  K 4.8   < >  --    < > 4.3 3.7 3.8  CL 104   < >  --   --   106 102 99  CO2 18*  --   --   --  23 24 28   GLUCOSE 248*   < >  --   --  185* 126* 105*  BUN 27*   < >  --   --  29* 36* 44*  CREATININE 1.52*   < >  --   --  1.64* 1.64* 1.65*  CALCIUM 8.9  --   --   --  8.8* 8.9 8.5*  MG  --   --  2.0  --  1.9  --   --   PROT 6.5  --   --   --  6.1*  --   --   ALBUMIN 4.0  --   --   --  3.5  --   --   AST 34  --   --   --  31  --   --   ALT 22  --   --   --  22  --   --   ALKPHOS 43  --   --   --  32*  --   --  BILITOT 1.0  --   --   --  1.2  --   --   GFRNONAA 43*  --   --   --  39* 39* 39*  ANIONGAP 16*  --   --   --  10 11 8    < > = values in this interval not displayed.    Lipids No results for input(s): CHOL, TRIG, HDL, LABVLDL, LDLCALC, CHOLHDL in the last 168 hours.  Hematology Recent Labs  Lab 07/26/21 0348 07/27/21 0057 07/28/21 0237  WBC 7.7 18.7* 10.1  RBC 3.48* 3.24* 3.12*  HGB 11.7* 10.6* 10.3*  HCT 35.2* 31.8* 31.1*  MCV 101.1* 98.1 99.7  MCH 33.6 32.7 33.0  MCHC 33.2 33.3 33.1  RDW 14.6 14.7 14.6  PLT 196 175 170   Thyroid No results for input(s): TSH, FREET4 in the last 168 hours.  BNP Recent Labs  Lab 07/25/21 1922  BNP 742.7*    DDimer No results for input(s): DDIMER in the last 168 hours.   Radiology    ECHOCARDIOGRAM COMPLETE  Result Date: 07/26/2021    ECHOCARDIOGRAM REPORT   Patient Name:   Brandon Robles Date of Exam: 07/26/2021 Medical Rec #:  [id number]   Height:       67.0 in Accession #:    [id number]  Weight:       164.9 lb Date of Birth:  Jul 14, 1926  BSA:          1.863 m Patient Age:    87 years    BP:           111/57 mmHg Patient Gender: M           HR:           55 bpm. Exam Location:  Inpatient Procedure: 2D Echo, Cardiac Doppler, Color Doppler and Intracardiac            Opacification Agent Indications:    CHF-Acute Systolic X32.35  History:        Patient has no prior history of Echocardiogram examinations. CAD                 and Previous Myocardial Infarction; Prior CABG.  Sonographer:    Merrie Roof RDCS Referring Phys: [id number]  1. Left ventricular ejection fraction, by estimation, is 30 to 35%. The left ventricle has moderately decreased function. The left ventricle demonstrates regional wall motion abnormalities (see scoring diagram/findings for description). Left ventricular  diastolic function could not be evaluated.  2. Right ventricular systolic function is normal. The right ventricular size is mildly enlarged. There is severely elevated pulmonary artery systolic pressure. The estimated right ventricular systolic pressure is 54.2 mmHg.  3. Left atrial size was mild to moderately dilated.  4. Right atrial size was mild to moderately dilated.  5. The mitral valve is normal in structure. Moderate mitral valve regurgitation.  6. Tricuspid valve regurgitation is moderate to severe.  7. The aortic valve is tricuspid. There is mild calcification of the aortic valve. There is mild thickening of the aortic valve. Aortic valve regurgitation is mild. Mild aortic valve sclerosis is present, with no evidence of aortic valve stenosis.  8. The inferior vena cava is normal in size with <50% respiratory variability, suggesting right atrial pressure of 8 mmHg. Comparison(s): No prior Echocardiogram. Conclusion(s)/Recommendation(s): LVEF reduced, with focal wall motion abnormalities as noted. Slow echo contrast movement at the LV apex, but no thrombus seen. Severely elevated right sided filling  pressures. FINDINGS  Left Ventricle: Left ventricular ejection fraction, by estimation, is 30 to 35%. The left ventricle has moderately decreased function. The left ventricle demonstrates regional wall motion abnormalities. Definity contrast agent was given IV to delineate the left ventricular endocardial borders. The left ventricular internal cavity size was normal in size. There is no left ventricular hypertrophy. Left ventricular diastolic function could not be evaluated.  LV Wall Scoring: The  apical septal segment, apical inferior segment, and apex are akinetic. The inferior wall, mid anteroseptal segment, apical lateral segment, mid inferoseptal segment, and apical anterior segment are hypokinetic. The anterior wall, antero-lateral wall, posterior wall, basal anteroseptal segment, and basal inferoseptal segment are normal. Right Ventricle: The right ventricular size is mildly enlarged. Right vetricular wall thickness was not well visualized. Right ventricular systolic function is normal. There is severely elevated pulmonary artery systolic pressure. The tricuspid regurgitant velocity is 3.70 m/s, and with an assumed right atrial pressure of 8 mmHg, the estimated right ventricular systolic pressure is 72.6 mmHg. Left Atrium: Left atrial size was mild to moderately dilated. Right Atrium: Right atrial size was mild to moderately dilated. Pericardium: There is no evidence of pericardial effusion. Mitral Valve: The mitral valve is normal in structure. Moderate mitral valve regurgitation. Tricuspid Valve: The tricuspid valve is normal in structure. Tricuspid valve regurgitation is moderate to severe. No evidence of tricuspid stenosis. Aortic Valve: The aortic valve is tricuspid. There is mild calcification of the aortic valve. There is mild thickening of the aortic valve. Aortic valve regurgitation is mild. Aortic regurgitation PHT measures 545 msec. Mild aortic valve sclerosis is present, with no evidence of aortic valve stenosis. Aortic valve mean gradient measures 4.0 mmHg. Aortic valve peak gradient measures 8.1 mmHg. Aortic valve area, by VTI measures 1.80 cm. Pulmonic Valve: The pulmonic valve was grossly normal. Pulmonic valve regurgitation is mild. No evidence of pulmonic stenosis. Aorta: The aortic root and ascending aorta are structurally normal, with no evidence of dilitation. Venous: The inferior vena cava is normal in size with less than 50% respiratory variability, suggesting right atrial  pressure of 8 mmHg. IAS/Shunts: The atrial septum is grossly normal.  LEFT VENTRICLE PLAX 2D LVIDd:         5.00 cm LVIDs:         3.60 cm LV PW:         0.90 cm LV IVS:        1.05 cm LVOT diam:     2.00 cm LV SV:         56 LV SV Index:   30 LVOT Area:     3.14 cm  LV Volumes (MOD) LV vol d, MOD A2C: 84.5 ml LV vol d, MOD A4C: 133.0 ml LV vol s, MOD A2C: 55.0 ml LV vol s, MOD A4C: 96.0 ml LV SV MOD A2C:     29.5 ml LV SV MOD A4C:     133.0 ml LV SV MOD BP:      24.4 ml RIGHT VENTRICLE          IVC RV Basal diam:  4.10 cm  IVC diam: 1.90 cm RV Mid diam:    3.10 cm LEFT ATRIUM           Index        RIGHT ATRIUM           Index LA diam:      3.20 cm 1.72 cm/m   RA Area:     19.40 cm LA  Vol [facility]): 80.2 ml 43.05 ml/m  RA Volume:   55.60 ml  29.84 ml/m LA Vol (A4C): 51.4 ml 27.59 ml/m  AORTIC VALVE AV Area (Vmax):    2.16 cm AV Area (Vmean):   1.98 cm AV Area (VTI):     1.80 cm AV Vmax:           142.00 cm/s AV Vmean:          91.100 cm/s AV VTI:            0.313 m AV Peak Grad:      8.1 mmHg AV Mean Grad:      4.0 mmHg LVOT Vmax:         97.80 cm/s LVOT Vmean:        57.500 cm/s LVOT VTI:          0.179 m LVOT/AV VTI ratio: 0.57 AI PHT:            545 msec  AORTA Ao Root diam: 3.50 cm TRICUSPID VALVE TR Peak grad:   54.8 mmHg TR Vmax:        370.00 cm/s  SHUNTS Systemic VTI:  0.18 m Systemic Diam: 2.00 cm Buford Dresser MD Electronically signed by Buford Dresser MD Signature Date/Time: 07/26/2021/6:06:10 PM    Final     Cardiac Studies   Echo: 07/26/21   IMPRESSIONS     1. Left ventricular ejection fraction, by estimation, is 30 to 35%. The  left ventricle has moderately decreased function. The left ventricle  demonstrates regional wall motion abnormalities (see scoring  diagram/findings for description). Left ventricular   diastolic function could not be evaluated.   2. Right ventricular systolic function is normal. The right ventricular  size is mildly enlarged. There is  severely elevated pulmonary artery  systolic pressure. The estimated right ventricular systolic pressure is  40.3 mmHg.   3. Left atrial size was mild to moderately dilated.   4. Right atrial size was mild to moderately dilated.   5. The mitral valve is normal in structure. Moderate mitral valve  regurgitation.   6. Tricuspid valve regurgitation is moderate to severe.   7. The aortic valve is tricuspid. There is mild calcification of the  aortic valve. There is mild thickening of the aortic valve. Aortic valve  regurgitation is mild. Mild aortic valve sclerosis is present, with no  evidence of aortic valve stenosis.   8. The inferior vena cava is normal in size with <50% respiratory  variability, suggesting right atrial pressure of 8 mmHg.   Comparison(s): No prior Echocardiogram.   Conclusion(s)/Recommendation(s): LVEF reduced, with focal wall motion  abnormalities as noted. Slow echo contrast movement at the LV apex, but no  thrombus seen. Severely elevated right sided filling pressures.   FINDINGS   Left Ventricle: Left ventricular ejection fraction, by estimation, is 30  to 35%. The left ventricle has moderately decreased function. The left  ventricle demonstrates regional wall motion abnormalities. Definity  contrast agent was given IV to delineate  the left ventricular endocardial borders. The left ventricular internal  cavity size was normal in size. There is no left ventricular hypertrophy.  Left ventricular diastolic function could not be evaluated.      LV Wall Scoring:  The apical septal segment, apical inferior segment, and apex are akinetic.  The inferior wall, mid anteroseptal segment, apical lateral segment, mid  inferoseptal segment, and apical anterior segment are hypokinetic. The  anterior wall, antero-lateral wall, posterior wall, basal anteroseptal  segment,  and basal inferoseptal segment are normal.   Right Ventricle: The right ventricular size is mildly  enlarged. Right  vetricular wall thickness was not well visualized. Right ventricular  systolic function is normal. There is severely elevated pulmonary artery  systolic pressure. The tricuspid  regurgitant velocity is 3.70 m/s, and with an assumed right atrial  pressure of 8 mmHg, the estimated right ventricular systolic pressure is  29.7 mmHg.   Left Atrium: Left atrial size was mild to moderately dilated.   Right Atrium: Right atrial size was mild to moderately dilated.   Pericardium: There is no evidence of pericardial effusion.   Mitral Valve: The mitral valve is normal in structure. Moderate mitral  valve regurgitation.   Tricuspid Valve: The tricuspid valve is normal in structure. Tricuspid  valve regurgitation is moderate to severe. No evidence of tricuspid  stenosis.   Aortic Valve: The aortic valve is tricuspid. There is mild calcification  of the aortic valve. There is mild thickening of the aortic valve. Aortic  valve regurgitation is mild. Aortic regurgitation PHT measures 545 msec.  Mild aortic valve sclerosis is  present, with no evidence of aortic valve stenosis. Aortic valve mean  gradient measures 4.0 mmHg. Aortic valve peak gradient measures 8.1 mmHg.  Aortic valve area, by VTI measures 1.80 cm.   Pulmonic Valve: The pulmonic valve was grossly normal. Pulmonic valve  regurgitation is mild. No evidence of pulmonic stenosis.   Aorta: The aortic root and ascending aorta are structurally normal, with  no evidence of dilitation.   Venous: The inferior vena cava is normal in size with less than 50%  respiratory variability, suggesting right atrial pressure of 8 mmHg.   IAS/Shunts: The atrial septum is grossly normal.   Patient Profile     87 y.o. male with PMH of bradycardia, CKD III, LBBB, CAD s/p inferior MI with RCA stenting '01, HTN, GERD, HLD and high degree AVB who presented with elevated troponin and CHF.   Assessment & Plan    HFrEF: Echo shows EF  of 30-35%, with apical inferior wall and apex being akinetic, anteroseptal, inferoseptal and apical anterior wall hypokinetic. Severely elevated PA pressure, moderate bi-atrial enlargement, mildly enlarged RV , moderate to severe TR. No prior echo found to compare. BNP 742, CT chest with small bilateral pleural effusions.  -- Given IV lasix, net -1.7L, breathing improved. No longer on 02 -- GDMT very limited, unable to use BB with hx of AVB block/bradycardia, no ACE/ARB with CKD III, blood pressures are soft as well  -- suspect he will need a low dose daily diuretic, 20mg  lasix daily at discharge   Elevated Troponin/CAD with prior stenting to RCA '01: 32>>286>>456. No chest pain PTA. Suspect demand ischemia in the setting of CHF.   High degree AVB: hx of the same, followed by EP for many years without indication for PPM. 2nd degree AVB type 1, intermittent 3rd degree AVB. Not on BB. Denies any syncope PTA.  -- follow up with EP outpatient   HTN: blood pressures more stable this morning.  -- ACEi/HCTZ held yesterday in the setting of soft bps. Would continue to hold at discharge for now, suspect low dose lasix may be sufficient.  CKD III: baseline Cr around 1.4-1.5.  -- BMET at follow up    For questions or updates, please contact [place] Please consult [email or web address] for contact info under        Signed, Reino Bellis, NP  07/28/2021, 9:22 AM    I  have personally seen and examined this patient. I agree with the assessment and plan as outlined above.  Dyspnea improved. Net negative 1.7 liters with diuresis No plans for ischemic workup.  Lasix 20 mg daily at discharge.  OK to discharge home today. He has f/u on 09/01/21 with Dr. Pernell Dupre.   Lauree Chandler, MD 07/28/2021 10:23 AM

## 2021-07-28 NOTE — Evaluation (Signed)
Physical Therapy Evaluation Patient Details Name: Brandon Robles MRN: [id number] DOB: September 09, 1926 Today's Date: 07/28/2021  History of Present Illness  87 yo male [facility] 10[facility] with ShOB. CTA negative for PE, but + for interstitial edema. Elevated troponin, cardiology following and attributing to demand ischemia. Workup for acute respiratory failure in setting of acute on chronic HF. PMH includes CAD s/p stenting, diverticulitis, HTN, GERD, pericarditis, LBBB and SA node dysfunction, CKD III.  Clinical Impression  Pt presents with slightly impaired activity tolerance vs baseline, dyspnea on exertion with accompanying O2 desaturation. Pt to benefit from acute PT to address deficits. Pt ambulated good hallway distance with SpO2 maintained at 91% on RA during normal gait, no physical assist required. Pt however did desat to 81% on RA during stair navigation, pt demonstrates need for home O2. 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 PT feels that other than O2 needs, pt is close to baseline and does not require Soldier services. PT to progress mobility as tolerated, and will continue to follow acutely.    SATURATION QUALIFICATIONS: (This note is used to comply with regulatory documentation for home oxygen)  Patient Saturations on Room Air at Rest = 95%  Patient Saturations on Room Air while Ambulating = 81%   Patient Saturations on 2 Liters of oxygen while Ambulating = 96%  Please briefly explain why patient needs home oxygen: to maintain SpO2 during mobility      Recommendations for follow up therapy are one component of a multi-disciplinary discharge planning process, led by the attending physician.  Recommendations may be updated based on patient status, additional functional criteria and insurance authorization.  Follow Up Recommendations No PT follow up;Supervision -  Intermittent (from daughter, helps him fix his meals and helps around house as needed)    Equipment Recommendations  None recommended by PT    Recommendations for Other Services       Precautions / Restrictions Precautions Precautions: Fall (moderate; history of fall over 3 years ago while sleepwalking) Restrictions Weight Bearing Restrictions: No      Mobility  Bed Mobility Overal bed mobility: Needs Assistance Bed Mobility: Supine to Sit     Supine to sit: Modified independent (Device/Increase time)     General bed mobility comments: Increased time, no physical assist    Transfers Overall transfer level: Modified independent Equipment used: None             General transfer comment: increased time to rise and steady  Ambulation/Gait Ambulation/Gait assistance: Supervision Gait Distance (Feet): 250 Feet Assistive device: None Gait Pattern/deviations: Step-through pattern Gait velocity: slightly decr, anticipate baseline   General Gait Details: slightly increased time to perform, no physical assist and per pt he feels at baseline level of mobility. No LOB or unsteadiness with directional changes  Stairs Stairs: Yes Stairs assistance: Supervision Stair Management: One rail Left;Alternating pattern;Step to pattern;Forwards Number of Stairs: 12 General stair comments: supervision for safety, pt utilizing step-to pattern initially transitioning to step-through when pt felt more comfortable. SpO61min 81% immediately post-steps, requiring 2LO2 to recover.  Wheelchair Mobility    Modified Rankin (Stroke Patients Only)       Balance Overall balance assessment: Mild deficits observed, not formally tested   Sitting balance-Leahy Scale: Good       Standing balance-Leahy Scale: Good Standing balance comment: no AD, no LOB  Pertinent Vitals/Pain Pain Assessment: No/denies pain    Home Living Family/patient expects  to be discharged to:: Private residence Living Arrangements: Alone Available Help at Discharge: Family;Available PRN/intermittently (daughter lives across the street) Type of Home: House Home Access: Stairs to enter Entrance Stairs-Rails: Chemical engineer of Steps: 4 Home Layout: Laundry or work area in [place]: Environmental consultant - 2 wheels;Walker - 4 wheels;Grab bars - tub/shower;Cane - single point;Shower seat      Prior Function Level of Independence: Independent         Comments: enjoys farming, raises cattle, 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 Dominance   Dominant Hand: Right    Extremity/Trunk Assessment   Upper Extremity Assessment Upper Extremity Assessment: Defer to OT evaluation    Lower Extremity Assessment Lower Extremity Assessment: Overall WFL for tasks assessed    Cervical / Trunk Assessment Cervical / Trunk Assessment: Normal  Communication   Communication: HOH  Cognition Arousal/Alertness: Awake/alert Behavior During Therapy: WFL for tasks assessed/performed Overall Cognitive Status: Within Functional Limits for tasks assessed                                 General Comments: very HOH, but follows all commands well when he hears them      General Comments General comments (skin integrity, edema, etc.): HR in afib rhythm, HRmax <100 during mobility. SpO74min 81% on RA requiring 2LO2 to recover with cues for proper breathing technique    Exercises     Assessment/Plan    PT Assessment Patient needs continued PT services  PT Problem List Decreased knowledge of precautions;Decreased activity tolerance;Cardiopulmonary status limiting activity       PT Treatment Interventions Therapeutic activities;Gait training;Therapeutic exercise;Patient/family education;Stair training;Balance training;Functional mobility training    PT Goals (Current goals can be found in the Care Plan section)  Acute Rehab PT  Goals Patient Stated Goal: go home PT Goal Formulation: With patient Time For Goal Achievement: 08/11/21 Potential to Achieve Goals: Good    Frequency Min 3X/week   Barriers to discharge        Co-evaluation               AM-PAC PT "6 Clicks" Mobility  Outcome Measure Help needed turning from your back to your side while in a flat bed without using bedrails?: None Help needed moving from lying on your back to sitting on the side of a flat bed without using bedrails?: None Help needed moving to and from a bed to a chair (including a wheelchair)?: None Help needed standing up from a chair using your arms (e.g., wheelchair or bedside chair)?: None Help needed to walk in hospital room?: None Help needed climbing 3-5 steps with a railing? : A Little 6 Click Score: 23    End of Session Equipment Utilized During Treatment: Oxygen Activity Tolerance: Patient tolerated treatment well Patient left: in chair;with call bell/phone within reach;with chair alarm set Nurse Communication: Mobility status PT Visit Diagnosis: Other abnormalities of gait and mobility (R26.89)    Time: 2355-7322 PT Time Calculation (min) (ACUTE ONLY): 27 min   Charges:   PT Evaluation $PT Eval Low Complexity: 1 Low PT Treatments $Gait Training: 8-22 mins       Stacie Glaze, PT DPT Acute Rehabilitation Services Pager [phone]  Office [phone]   [place] 07/28/2021, 11:40 AM

## 2021-07-30 LAB — CULTURE, BLOOD (ROUTINE X 2)
Culture: NO GROWTH
Culture: NO GROWTH
Special Requests: ADEQUATE
Special Requests: ADEQUATE

## 2021-08-06 DIAGNOSIS — Z23 Encounter for immunization: Secondary | ICD-10-CM | POA: Diagnosis not present

## 2021-08-06 DIAGNOSIS — I5020 Unspecified systolic (congestive) heart failure: Secondary | ICD-10-CM | POA: Diagnosis not present

## 2021-08-14 DIAGNOSIS — Z23 Encounter for immunization: Secondary | ICD-10-CM | POA: Diagnosis not present

## 2021-08-17 ENCOUNTER — Encounter (HOSPITAL_COMMUNITY): Payer: Self-pay | Admitting: Emergency Medicine

## 2021-08-17 ENCOUNTER — Emergency Department (HOSPITAL_COMMUNITY)
Admission: EM | Admit: 2021-08-17 | Discharge: 2021-08-17 | Disposition: A | Discharge status: Home / Self Care | Payer: Medicare Other | Attending: Emergency Medicine | Admitting: Emergency Medicine

## 2021-08-17 ENCOUNTER — Emergency Department (HOSPITAL_COMMUNITY): Payer: Medicare Other

## 2021-08-17 DIAGNOSIS — I5033 Acute on chronic diastolic (congestive) heart failure: Secondary | ICD-10-CM | POA: Diagnosis not present

## 2021-08-17 DIAGNOSIS — Z79899 Other long term (current) drug therapy: Secondary | ICD-10-CM | POA: Diagnosis not present

## 2021-08-17 DIAGNOSIS — N183 Chronic kidney disease, stage 3 unspecified: Secondary | ICD-10-CM | POA: Diagnosis not present

## 2021-08-17 DIAGNOSIS — R0600 Dyspnea, unspecified: Secondary | ICD-10-CM

## 2021-08-17 DIAGNOSIS — Z7982 Long term (current) use of aspirin: Secondary | ICD-10-CM | POA: Insufficient documentation

## 2021-08-17 DIAGNOSIS — I517 Cardiomegaly: Secondary | ICD-10-CM | POA: Diagnosis not present

## 2021-08-17 DIAGNOSIS — I130 Hypertensive heart and chronic kidney disease with heart failure and stage 1 through stage 4 chronic kidney disease, or unspecified chronic kidney disease: Secondary | ICD-10-CM | POA: Insufficient documentation

## 2021-08-17 DIAGNOSIS — Z87891 Personal history of nicotine dependence: Secondary | ICD-10-CM | POA: Diagnosis not present

## 2021-08-17 DIAGNOSIS — Z20822 Contact with and (suspected) exposure to covid-19: Secondary | ICD-10-CM | POA: Insufficient documentation

## 2021-08-17 DIAGNOSIS — R0602 Shortness of breath: Secondary | ICD-10-CM | POA: Diagnosis not present

## 2021-08-17 DIAGNOSIS — I2510 Atherosclerotic heart disease of native coronary artery without angina pectoris: Secondary | ICD-10-CM | POA: Insufficient documentation

## 2021-08-17 DIAGNOSIS — Z85828 Personal history of other malignant neoplasm of skin: Secondary | ICD-10-CM | POA: Diagnosis not present

## 2021-08-17 DIAGNOSIS — I10 Essential (primary) hypertension: Secondary | ICD-10-CM | POA: Diagnosis not present

## 2021-08-17 DIAGNOSIS — R0689 Other abnormalities of breathing: Secondary | ICD-10-CM | POA: Diagnosis not present

## 2021-08-17 DIAGNOSIS — J90 Pleural effusion, not elsewhere classified: Secondary | ICD-10-CM | POA: Diagnosis not present

## 2021-08-17 LAB — CBC
HCT: 36 % — ABNORMAL LOW (ref 39.0–52.0)
Hemoglobin: 11.8 g/dL — ABNORMAL LOW (ref 13.0–17.0)
MCH: 33.7 pg (ref 26.0–34.0)
MCHC: 32.8 g/dL (ref 30.0–36.0)
MCV: 102.9 fL — ABNORMAL HIGH (ref 80.0–100.0)
Platelets: 185 10*3/uL (ref 150–400)
RBC: 3.5 MIL/uL — ABNORMAL LOW (ref 4.22–5.81)
RDW: 14.8 % (ref 11.5–15.5)
WBC: 8.8 10*3/uL (ref 4.0–10.5)
nRBC: 0 % (ref 0.0–0.2)

## 2021-08-17 LAB — BASIC METABOLIC PANEL
Anion gap: 9 (ref 5–15)
BUN: 28 mg/dL — ABNORMAL HIGH (ref 8–23)
CO2: 23 mmol/L (ref 22–32)
Calcium: 9 mg/dL (ref 8.9–10.3)
Chloride: 106 mmol/L (ref 98–111)
Creatinine, Ser: 1.48 mg/dL — ABNORMAL HIGH (ref 0.61–1.24)
GFR, Estimated: 44 mL/min — ABNORMAL LOW (ref >60)
Glucose, Bld: 152 mg/dL — ABNORMAL HIGH (ref 70–99)
Potassium: 3.7 mmol/L (ref 3.5–5.1)
Sodium: 138 mmol/L (ref 135–145)

## 2021-08-17 LAB — RESP PANEL BY RT-PCR (FLU A&B, COVID) ARPGX2
Influenza A by PCR: NEGATIVE
Influenza B by PCR: NEGATIVE
SARS Coronavirus 2 by RT PCR: NEGATIVE

## 2021-08-17 LAB — TROPONIN I (HIGH SENSITIVITY)
Troponin I (High Sensitivity): 234 ng/L (ref <18)
Troponin I (High Sensitivity): 235 ng/L (ref <18)

## 2021-08-17 LAB — BRAIN NATRIURETIC PEPTIDE: B Natriuretic Peptide: 719.2 pg/mL — ABNORMAL HIGH (ref 0.0–100.0)

## 2021-08-17 MED ORDER — FUROSEMIDE 10 MG/ML IJ SOLN
20.0000 mg | Freq: Once | INTRAMUSCULAR | Status: AC
  Administered 2021-08-17: 20 mg via INTRAVENOUS
  Filled 2021-08-17: qty 2

## 2021-08-17 NOTE — Discharge Instructions (Signed)
Return for any problem.  ?

## 2021-08-17 NOTE — ED Notes (Signed)
RN collected troponin, ambulated pt per MD request. Pt O2 remains greater than 92% on room air ambulating. Recovers to 97% at rest. No distress noted, steady gait.

## 2021-08-17 NOTE — ED Notes (Signed)
Pt titrated to RA, tolerated well. Denies discomfort, no distress noted. Family at bedside. Call light in reach. Gave urinal pending lasix admin.

## 2021-08-17 NOTE — ED Notes (Signed)
Pt verbalized understanding of d/c instructions, meds and followup care. Denies questions. VSS, no distress noted. W/C to exit with all belongings.  

## 2021-08-17 NOTE — ED Notes (Signed)
Gave pt snack

## 2021-08-17 NOTE — ED Triage Notes (Signed)
Patient arrived with EMS for shortness of breath for the last couple of hours.  One duoneb given and now breathing a bit better.  Patient is a belly breather.  Patient room air sat of 88%.  Patient placed on 2L via [place] and is at 96%.  Patient with COPD history.

## 2021-08-17 NOTE — ED Provider Notes (Signed)
Seen after prior EDP.  Patient ambulating around ED without difficulty  Patient desires discharge home.  Patient and his family member at bedside understand that admission would be possible if they desired.  They do not.  They would prefer to go home.  They have been in the ED for approximately 10-1/2 hours now.  Patient reports that he has an established PCP visit for tomorrow.  He plans on going.  Importance of close follow-up is stressed.  Strict return precautions given and understood.   Valarie Merino, MD 08/17/21 [phone]

## 2021-08-17 NOTE — ED Provider Notes (Signed)
[facility] EMERGENCY DEPARTMENT Provider Note   CSN: [id number] Arrival date & time: 08/17/21  2500     History Chief Complaint  Patient presents with   Shortness of Brandon Robles is a 87 y.o. male.  Patient is a 87 year old male with a history of CHF, CAD, LBBB, SA node dysfunction, HTN, and multiple rib fractures who presents with complaints of shortness of breath that began 3 days ago after receiving most recent COVID booster.  Endorses orthopnea and SOB when sitting up. DOE. He reports that he has been taking his diuretic as prescribed. Denies CP, no palpitations.  He did receive a breathing treatment with some subjective improvement.  However he reports no cough, though has had sinus drainage.  Endorses chills with some shaking which began after COVID vaccination. He has taken Tylenol to help with this. Has not had a fever.  Denies any recent travel or periods of immobilization.  The history is provided by the patient and a relative. No language interpreter was used.  Shortness of Breath Associated symptoms: no abdominal pain, no chest pain, no cough, no diaphoresis, no headaches, no vomiting and no wheezing       Past Medical History:  Diagnosis Date   Blastocystis hominis    Bradycardia 07/18/2013   CAD (coronary artery disease)    S/P inferior MI 07/2000 with RCA stenting at that time. In 03/2006 he had a LAD non-drug-eluting stent implanted.   Chronic insomnia    Complex renal cyst    Left   Diverticulitis    ED (erectile dysfunction)    Vacuum pump   Essential hypertension, benign    Stable, on lisinopril-HCTZ & Atenolol.   GERD (gastroesophageal reflux disease)    H/O pericarditis    Hemorrhoids    LBBB (left bundle branch block)    Multiple facial fractures ([place]) 02/25/2016   Multiple rib fractures 09/25/10   PTX 09/2010   Nephrolithiasis    Renal stone basketing.   Pure hypercholesterolemia    On Red Yeast Rice & CoQ 10.    Sinoatrial node dysfunction (HCC)    EC Holter 24hr 06/24/13 showed sinus bradycardia with probable chronotropic incompetence. May need pacemaker per Dr. Daneen Schick.   Skin cancer    Left base (Face) & (Ear in 12/2011).    Patient Active Problem List   Diagnosis Date Noted   Acute on chronic diastolic CHF (congestive heart failure) ([place]) 07/26/2021   Elevated troponin 37/01/8888   Metabolic acidosis 16/94/5038   Lactic acidosis 07/26/2021   Hyperglycemia 07/26/2021   CKD (chronic kidney disease) stage 3, GFR 30-59 ml/min (HCC) 07/26/2021   Acute respiratory failure with hypoxia ([place]) 07/25/2021   Chronic diastolic heart failure ([place]) 10/23/2020   Hypertension 10/24/2019   Complete heart block, transient ([place]) 12/29/2016   Fall 02/25/2016   Multiple facial fractures ([place]) 02/25/2016   Fracture of phalanx of right thumb 02/25/2016   Traumatic subdural hematoma 02/24/2016   Bradycardia 07/18/2013   Nephrolithiasis    Diverticulitis    Hemorrhoids    ED (erectile dysfunction)    GERD (gastroesophageal reflux disease)    Hyperlipemia    CAD (coronary artery disease)     Past Surgical History:  Procedure Laterality Date   CORONARY ANGIOPLASTY WITH STENT PLACEMENT  07/25/00   MI in 07/25/00 with RCA stenting at that time.   CORONARY ANGIOPLASTY WITH STENT PLACEMENT  04/14/06   Bare metal stent in proximal LAD [facility] - 3.5 x16 stent) by Dr. Linard Millers.   PROSTATE BIOPSY     Evidence   RIGHT THORACOTOMY,OPEN REDUCTION AND INTERNAL FIXATION OF RIBS 4-7  09/25/2010   STONE EXTRACTION WITH BASKET     Renal       Family History  Problem Relation Age of Onset   CVA Mother    Heart attack Father    Kidney failure Father    Heart disease Sister        Possible heart disease   Aneurysm Sister    Hypertension Brother    Heart disease Brother    Heart failure Brother    Lung disease Brother    Diabetes Brother     Social History   Tobacco Use   Smoking  status: Former    Types: Cigars   Smokeless tobacco: Never   Tobacco comments:    quit 1990  Vaping Use   Vaping Use: Never used  Substance Use Topics   Alcohol use: Yes    Comment: NOT ON A REGULAR BASIS   Drug use: No    Home Medications Prior to Admission medications   Medication Sig Start Date End Date Taking? Authorizing Provider  acetaminophen (TYLENOL) 325 MG tablet Take 2 tablets (650 mg total) by mouth every 4 (four) hours as needed for mild pain. 02/25/16   Riebock, Estill Bakes, NP  amoxicillin (AMOXIL) 500 MG capsule Take 4 capsules by mouth as needed 1 hour prior to dental procedures. 02/20/17   [provider]  aspirin EC 81 MG tablet Take 81 mg by mouth daily.    [provider]  co-enzyme Q-10 30 MG capsule Take 30 mg by mouth daily.    [provider]  furosemide (LASIX) 20 MG tablet Take 1 tablet (20 mg total) by mouth daily. 07/29/21   Florencia Reasons, MD  nitroGLYCERIN (NITROSTAT) 0.4 MG SL tablet Place 0.4 mg under the tongue every 5 (five) minutes as needed for chest pain. (MAXIMUM of 3 TABLETS)    [provider]  OVER THE COUNTER MEDICATION Take 2 capsules by mouth daily. OTC [place] capsules    [provider]  OVER THE COUNTER MEDICATION Take 1 capsule by mouth daily. OMEGA Q PLUS    [provider]  phenylephrine (SUDAFED PE) 10 MG TABS tablet Take 10 mg by mouth every 6 (six) hours as needed (nasal congestion).    [provider]  Red Yeast Rice 600 MG CAPS Take 600 mg by mouth daily.    [provider]  sodium chloride (OCEAN) 0.65 % SOLN nasal spray Place 1 spray into both nostrils daily as needed for congestion.    [provider]  temazepam (RESTORIL) 30 MG capsule Take 30 mg by mouth at bedtime.    [provider]  Throat Lozenges [facility] FRIEND MT) Use as directed 1 lozenge in the mouth or throat 2 (two) times daily as needed (congestion).    [provider]     Allergies    Vitamin d analogs, Fish oil, Ambien [zolpidem tartrate], and Honey  Review of Systems   Review of Systems  Constitutional:  Positive for chills. Negative for diaphoresis and fatigue.  HENT: Negative.    Respiratory:  Positive for shortness of breath. Negative for cough, chest tightness and wheezing.   Cardiovascular:  Negative for chest pain.  Gastrointestinal:  Negative for abdominal pain, diarrhea, nausea and vomiting.  Genitourinary:  Negative for flank pain.  Musculoskeletal: Negative.   Skin:  Negative.   Neurological: Negative.  Negative for dizziness, light-headedness, numbness and headaches.  Hematological: Negative.   Psychiatric/Behavioral: Negative.     Physical Exam Updated Vital Signs BP (!) 148/65   Pulse 62   Temp 97.7 F (36.5 C) (Oral)   Resp 16   SpO2 96%   Physical Exam Constitutional:      General: He is not in acute distress.    Appearance: He is well-developed. He is not ill-appearing, toxic-appearing or diaphoretic.  HENT:     Head: Normocephalic and atraumatic.     Mouth/Throat:     Mouth: Mucous membranes are moist.     Pharynx: Oropharynx is clear.  Eyes:     Extraocular Movements: Extraocular movements intact.     Pupils: Pupils are equal, round, and reactive to light.  Neck:     Vascular: JVD present.  Cardiovascular:     Rate and Rhythm: Normal rate. Rhythm irregular.  Pulmonary:     Breath sounds: Normal breath sounds. No decreased breath sounds, wheezing, rhonchi or rales.  Musculoskeletal:     Cervical back: Normal range of motion and neck supple.  Neurological:     Mental Status: He is alert.    ED Results / Procedures / Treatments   Labs (all labs ordered are listed, but only abnormal results are displayed) Labs Reviewed  BASIC METABOLIC PANEL - Abnormal; Notable for the following components:      Result Value   Glucose, Bld 152 (*)    BUN 28 (*)    Creatinine, Ser 1.48 (*)    GFR, Estimated 44 (*)    All  other components within normal limits  CBC - Abnormal; Notable for the following components:   RBC 3.50 (*)    Hemoglobin 11.8 (*)    HCT 36.0 (*)    MCV 102.9 (*)    All other components within normal limits  BRAIN NATRIURETIC PEPTIDE - Abnormal; Notable for the following components:   B Natriuretic Peptide 719.2 (*)    All other components within normal limits  RESP PANEL BY RT-PCR (FLU A&B, COVID) ARPGX2  TROPONIN I (HIGH SENSITIVITY)    EKG None  Radiology DG Chest 2 View  Result Date: 08/17/2021 CLINICAL DATA:  In a 87 year old male presents with shortness of breath. EXAM: CHEST - 2 VIEW COMPARISON:  July 25, 2021. FINDINGS: Trachea is midline. Image slightly rotated. Cardia of mediastinal contours and hilar structures are stable with signs of cardiac enlargement. Diminished lung volumes with signs of basilar opacities and blunting of RIGHT and LEFT costodiaphragmatic sulcus. Fissural fluid in the minor fissure in the RIGHT chest is similar to previous imaging. Mild increased interstitial markings in the background also similar to prior exams. Signs of prior RIGHT-sided rib fractures with cortical plate and screw fixation along the RIGHT lateral chest as before IMPRESSION: Increasing bilateral effusions in interstitial changes suggestive of CHF or volume overload. Cardiomegaly. Electronically Signed   By: Zetta Bills M.D.   On: 08/17/2021 07:59    Procedures Procedures   Medications Ordered in ED Medications  furosemide (LASIX) injection 20 mg (20 mg Intravenous Given 08/17/21 1342)    ED Course  I have reviewed the triage vital signs and the nursing notes.  Pertinent labs & imaging results that were available during my care of the patient were reviewed by me and considered in my medical decision making (see chart for details).    MDM Rules/Calculators/A&P  Patient presents with complaints of shortness of breath for the past 3 days.  He was  recently admitted for acute hypoxic respiratory failure secondary to CHF exacerbation and was started on 20 mg Lasix on 13 October.  Although he reports that he has been compliant with this medication, he has evidence of volume overload with JVD and lower extremity edema.  Furthermore his chest x-ray has shown interval increase in bilateral lower lung field opacities suggestive of volume overload in the setting of CHF exacerbation with an oxygen saturation 88%, improved to 100% on 2 L nasal cannula.  He does not wear oxygen at home.  He has not had any cough no fever to indicate pneumonia.  Denies any chest pain and no history concerning for ACS.  No chest pain, not tachycardic, no recent periods of immobilization that would be concerning for PE.  No reported history of asthma either.   His BNP has returned in the 700s and his troponin troponin 234 slightly improved compared to prior. Due to his recent hospitalization several weeks ago, respiratory panel was obtained which returned negative Will administer dose of Lasix and check O2 sats afterwards.  If he is still requiring supplemental oxygen to maintain sats above 90% may need to be admitted, otherwise he can likely be discharged home with instructions to take 2 of his Lasix daily for a total of 40 mg and to follow-up with his PCP.  Final Clinical Impression(s) / ED Diagnoses Final diagnoses:  None    Rx / DC Orders ED Discharge Orders     None        Delene Ruffini, MD 08/17/21 1534    Blanchie Dessert, MD 08/23/21 1436

## 2021-08-27 ENCOUNTER — Other Ambulatory Visit (HOSPITAL_COMMUNITY): Payer: Self-pay

## 2021-08-28 DIAGNOSIS — I5033 Acute on chronic diastolic (congestive) heart failure: Secondary | ICD-10-CM | POA: Diagnosis not present

## 2021-08-30 NOTE — Progress Notes (Signed)
Cardiology Office Note:    Date:  09/01/2021   ID:  Brandon Robles, DOB 02/27/1926, MRN [id number]  PCP:  Brandon Orn, MD  Cardiologist:  Brandon Grooms, MD   Referring MD: Brandon Orn, MD   Chief Complaint  Patient presents with   Follow-up    Bradycardia CAD CHF Hypertension CKD    History of Present Illness:    Brandon Robles is a 87 y.o. male with a hx of coronary artery disease with prior non-drug-eluting stent implantation during acute inferior in 2001and LAD bare-metal stent 2007, hypertension, hyperlipidemia, sinus node dysfunction, complete heart block, accelerated junctional rhythm and history of pericarditis. Though has bradycardia, he is asymptomatic and not needed pacer (Followed by Dr. Cristopher Robles EP)  Brandon Robles hospitalization October 2022 billed as COPD exacerbation and acute on chronic diastolic heart failure.  He bounced back to the emergency room in November again with fluid overload.  Was given IV Lasix and sent home.  Echocardiogram done in October as noted below.  He has new systolic heart failure with EF 30%.  He is on no current therapy for systolic heart failure.  Lisinopril HCT which she had been on chronically was discontinued during one of the hospital visits.  His blood pressure is elevated today.  Past Medical History:  Diagnosis Date   Blastocystis hominis    Bradycardia 07/18/2013   CAD (coronary artery disease)    S/P inferior MI 07/2000 with RCA stenting at that time. In 03/2006 he had a LAD non-drug-eluting stent implanted.   Chronic insomnia    Complex renal cyst    Left   Diverticulitis    ED (erectile dysfunction)    Vacuum pump   Essential hypertension, benign    Stable, on lisinopril-HCTZ & Atenolol.   GERD (gastroesophageal reflux disease)    H/O pericarditis    Hemorrhoids    LBBB (left bundle branch block)    Multiple facial fractures ([place]) 02/25/2016   Multiple rib fractures 09/25/10   PTX 09/2010   Nephrolithiasis    Renal  stone basketing.   Pure hypercholesterolemia    On Red Yeast Rice & CoQ 10.   Sinoatrial node dysfunction (HCC)    EC Holter 24hr 06/24/13 showed sinus bradycardia with probable chronotropic incompetence. May need pacemaker per Dr. Daneen Robles.   Skin cancer    Left base (Face) & (Ear in 12/2011).    Past Surgical History:  Procedure Laterality Date   CORONARY ANGIOPLASTY WITH STENT PLACEMENT  07/25/00   MI in 07/25/00 with RCA stenting at that time.   CORONARY ANGIOPLASTY WITH STENT PLACEMENT  04/14/06   Bare metal stent in proximal LAD [facility] - 3.5 x16 stent) by Dr. Linard Robles.   PROSTATE BIOPSY     Evidence   RIGHT THORACOTOMY,OPEN REDUCTION AND INTERNAL FIXATION OF RIBS 4-7  09/25/2010   STONE EXTRACTION WITH BASKET     Renal    Current Medications: Current Meds  Medication Sig   acetaminophen (TYLENOL) 325 MG tablet Take 2 tablets (650 mg total) by mouth every 4 (four) hours as needed for mild pain.   amoxicillin (AMOXIL) 500 MG capsule Take 4 capsules by mouth as needed 1 hour prior to dental procedures.   aspirin EC 81 MG tablet Take 81 mg by mouth daily.   co-enzyme Q-10 30 MG capsule Take 30 mg by mouth daily.   dapagliflozin propanediol (FARXIGA) 10 MG TABS tablet Take 1 tablet (10 mg total) by  mouth daily before breakfast.   furosemide (LASIX) 20 MG tablet Take 1 tablet (20 mg total) by mouth daily.   nitroGLYCERIN (NITROSTAT) 0.4 MG SL tablet Place 0.4 mg under the tongue every 5 (five) minutes as needed for chest pain. (MAXIMUM of 3 TABLETS)   OVER THE COUNTER MEDICATION Take 2 capsules by mouth daily. OTC [place] capsules   OVER THE COUNTER MEDICATION Take 1 capsule by mouth daily. OMEGA Q PLUS   phenylephrine (SUDAFED PE) 10 MG TABS tablet Take 10 mg by mouth every 6 (six) hours as needed (nasal congestion).   Red Yeast Rice 600 MG CAPS Take 600 mg by mouth daily.   sodium chloride (OCEAN) 0.65 % SOLN nasal spray Place 1 spray into both nostrils daily  as needed for congestion.   temazepam (RESTORIL) 30 MG capsule Take 30 mg by mouth at bedtime.   Throat Lozenges [facility] FRIEND MT) Use as directed 1 lozenge in the mouth or throat 2 (two) times daily as needed (congestion).     Allergies:   Vitamin d analogs, Fish oil, Ambien [zolpidem tartrate], and Honey   Social History   Socioeconomic History   Marital status: Single    Spouse name: Not on file   Number of children: Not on file   Years of education: Not on file   Highest education level: Not on file  Occupational History   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: RETIRED  Tobacco Use   Smoking status: Former    Types: Cigars   Smokeless tobacco: Never   Tobacco comments:    quit 1990  Vaping Use   Vaping Use: Never used  Substance and Sexual Activity   Alcohol use: Yes    Comment: NOT ON A REGULAR BASIS   Drug use: No   Sexual activity: Not on file  Other Topics Concern   Not on file  Social History Narrative   Not on file   Social Determinants of Health   Financial Resource Strain: Not on file  Food Insecurity: Not on file  Transportation Needs: Not on file  Physical Activity: Not on file  Stress: Not on file  Social Connections: Not on file     Family History: The patient's family history includes Aneurysm in his sister; CVA in his mother; Diabetes in his brother; Heart attack in his father; Heart disease in his brother and sister; Heart failure in his brother; Hypertension in his brother; Kidney failure in his father; Lung disease in his brother.  ROS:   Please see the history of present illness.    Cannot sleep on his right side.  Denies syncope.  Has occasional lower extremity swelling.  All other systems reviewed and are negative.  EKGs/Labs/Other Studies Reviewed:    The following studies were reviewed today: 2D Doppler echocardiogram July 26, 2021: IMPRESSIONS     1. Left ventricular ejection fraction, by estimation, is 30 to 35%. The  left  ventricle has moderately decreased function. The left ventricle  demonstrates regional wall motion abnormalities (see scoring  diagram/findings for description). Left ventricular   diastolic function could not be evaluated.   2. Right ventricular systolic function is normal. The right ventricular  size is mildly enlarged. There is severely elevated pulmonary artery  systolic pressure. The estimated right ventricular systolic pressure is  96.2 mmHg.   3. Left atrial size was mild to moderately dilated.   4. Right atrial size was mild to moderately dilated.   5. The mitral valve is  normal in structure. Moderate mitral valve  regurgitation.   6. Tricuspid valve regurgitation is moderate to severe.   7. The aortic valve is tricuspid. There is mild calcification of the  aortic valve. There is mild thickening of the aortic valve. Aortic valve  regurgitation is mild. Mild aortic valve sclerosis is present, with no  evidence of aortic valve stenosis.   8. The inferior vena cava is normal in size with <50% respiratory  variability, suggesting right atrial pressure of 8 mmHg.   Comparison(s): No prior Echocardiogram.   Conclusion(s)/Recommendation(s): LVEF reduced, with focal wall motion  abnormalities as noted. Slow echo contrast movement at the LV apex, but no  thrombus seen. Severely elevated right sided filling pressures.   EKG:  EKG not performed today.  See Dr. Tanna Furry note from January.  Recent Labs: 07/26/2021: ALT 22; Magnesium 1.9 08/17/2021: B Natriuretic Peptide 719.2; BUN 28; Creatinine, Ser 1.48; Hemoglobin 11.8; Platelets 185; Potassium 3.7; Sodium 138  Recent Lipid Panel    Component Value Date/Time   CHOL 140 06/30/2014 1025   TRIG 48.0 06/30/2014 1025   HDL 45.80 06/30/2014 1025   CHOLHDL 3 06/30/2014 1025   VLDL 9.6 06/30/2014 1025   LDLCALC 85 06/30/2014 1025    Physical Exam:    VS:  BP (!) 172/68   Pulse 64   Ht 5' 7"  (1.702 m)   Wt 159 lb (72.1 kg)   SpO2  96%   BMI 24.90 kg/m     Wt Readings from Last 3 Encounters:  09/01/21 159 lb (72.1 kg)  07/28/21 158 lb 6.4 oz (71.8 kg)  10/23/20 165 lb (74.8 kg)     GEN: Compatible with age in appearance. No acute distress HEENT: Normal NECK: No JVD. LYMPHATICS: No lymphadenopathy CARDIAC: Apical systolic murmur. IRR no gallop, or edema. VASCULAR:  Normal Pulses. No bruits. RESPIRATORY:  Clear to auscultation without rales, wheezing or rhonchi  ABDOMEN: Soft, non-tender, non-distended, No pulsatile mass, MUSCULOSKELETAL: No deformity  SKIN: Warm and dry NEUROLOGIC:  Alert and oriented x 3 PSYCHIATRIC:  Normal affect   ASSESSMENT:    1. Chronic systolic heart failure ([place])   2. Complete heart block, transient ([place])   3. Primary hypertension   4. Coronary artery disease involving native coronary artery of native heart without angina pectoris   5. Mixed hyperlipidemia    PLAN:    In order of problems listed above:  New systolic heart failure.  Only on Lasix for decongestion.  Start Farxiga 10 mg/day.  Follow-up in 2 weeks.  Basic metabolic panel at that time.  Unable to use beta-blocker therapy because of AV block.  I am not sure why this ACE inhibitor/hydrochlorothiazide was discontinued.  Will investigate.  For the time being continue Lasix. Being followed by Dr. Lovena Le.  Has not felt that pacing is needed. Blood pressure is elevated.  Will consider hydralazine/long-acting nitrates or resuming a RAAS inhibitor/Arni. Denies angina pectoris.  Continue aspirin.  Has likely had an interval myocardial infarction. Not on statin therapy.   2-week follow-up.  Be met at that time.   Medication Adjustments/Labs and Tests Ordered: Current medicines are reviewed at length with the patient today.  Concerns regarding medicines are outlined above.  No orders of the defined types were placed in this encounter.  Meds ordered this encounter  Medications   dapagliflozin propanediol (FARXIGA) 10 MG  TABS tablet    Sig: Take 1 tablet (10 mg total) by mouth daily before breakfast.    Dispense:  30 tablet    Refill:  11        Signed, Brandon Grooms, MD  09/01/2021 4:53 PM    [facility]

## 2021-09-01 ENCOUNTER — Ambulatory Visit: Payer: Medicare Other | Admitting: Interventional Cardiology

## 2021-09-01 ENCOUNTER — Encounter: Payer: Self-pay | Admitting: Interventional Cardiology

## 2021-09-01 ENCOUNTER — Other Ambulatory Visit: Payer: Self-pay

## 2021-09-01 VITALS — BP 172/68 | HR 64 | Ht 67.0 in | Wt 159.0 lb

## 2021-09-01 DIAGNOSIS — I442 Atrioventricular block, complete: Secondary | ICD-10-CM | POA: Diagnosis not present

## 2021-09-01 DIAGNOSIS — I5022 Chronic systolic (congestive) heart failure: Secondary | ICD-10-CM | POA: Diagnosis not present

## 2021-09-01 DIAGNOSIS — I5032 Chronic diastolic (congestive) heart failure: Secondary | ICD-10-CM

## 2021-09-01 DIAGNOSIS — I10 Essential (primary) hypertension: Secondary | ICD-10-CM | POA: Diagnosis not present

## 2021-09-01 DIAGNOSIS — I2510 Atherosclerotic heart disease of native coronary artery without angina pectoris: Secondary | ICD-10-CM

## 2021-09-01 DIAGNOSIS — E782 Mixed hyperlipidemia: Secondary | ICD-10-CM

## 2021-09-01 MED ORDER — DAPAGLIFLOZIN PROPANEDIOL 10 MG PO TABS: 10.0000 mg | ORAL_TABLET | Freq: Every day | ORAL | 11 refills | Status: DC

## 2021-09-01 NOTE — Patient Instructions (Signed)
Medication Instructions:  Your physician has recommended you make the following change in your medication:   START Farxiga 10 mg 1 tablet by mouth daily   *If you need a refill on your cardiac medications before your next appointment, please call your pharmacy*   Follow-Up: At [facility], you and your health needs are our priority.  As part of our continuing mission to provide you with exceptional heart care, we have created designated Provider Care Teams.  These Care Teams include your primary Cardiologist (physician) and Advanced Practice Providers (APPs -  Physician Assistants and Nurse Practitioners) who all work together to provide you with the care you need, when you need it.  We recommend signing up for the patient portal called "MyChart".  Sign up information is provided on this After Visit Summary.  MyChart is used to connect with patients for Virtual Visits (Telemedicine).  Patients are able to view lab/test results, encounter notes, upcoming appointments, etc.  Non-urgent messages can be sent to your provider as well.   To learn more about what you can do with MyChart, go to [email or web address].    Your next appointment:   2 week(s)  The format for your next appointment:   In Person  Provider:   Sinclair Grooms, MD

## 2021-09-03 ENCOUNTER — Telehealth: Payer: Self-pay

## 2021-09-03 NOTE — Telephone Encounter (Signed)
**Note De-Identified Sofiah Lyne Obfuscation** Wilder Glade PA started through covermymeds. Key: [id number]

## 2021-09-07 DIAGNOSIS — I5020 Unspecified systolic (congestive) heart failure: Secondary | ICD-10-CM | POA: Diagnosis not present

## 2021-09-07 DIAGNOSIS — Z1389 Encounter for screening for other disorder: Secondary | ICD-10-CM | POA: Diagnosis not present

## 2021-09-07 DIAGNOSIS — I2510 Atherosclerotic heart disease of native coronary artery without angina pectoris: Secondary | ICD-10-CM | POA: Diagnosis not present

## 2021-09-07 DIAGNOSIS — N1831 Chronic kidney disease, stage 3a: Secondary | ICD-10-CM | POA: Diagnosis not present

## 2021-09-07 DIAGNOSIS — Z0000 Encounter for general adult medical examination without abnormal findings: Secondary | ICD-10-CM | POA: Diagnosis not present

## 2021-09-07 DIAGNOSIS — I129 Hypertensive chronic kidney disease with stage 1 through stage 4 chronic kidney disease, or unspecified chronic kidney disease: Secondary | ICD-10-CM | POA: Diagnosis not present

## 2021-09-12 NOTE — Progress Notes (Signed)
Cardiology Office Note:    Date:  09/12/2021   ID:  COLBEY WIRTANEN, DOB [birth year], MRN [id number]  PCP:  Lavone Orn, MD  Cardiologist:  Sinclair Grooms, MD   Referring MD: Lavone Orn, MD   No chief complaint on file.   History of Present Illness:    Brandon Robles is a 87 y.o. male with a hx of coronary artery disease with prior non-drug-eluting stent implantation during acute inferior in 2001and LAD bare-metal stent 2007, hypertension, hyperlipidemia, sinus node dysfunction, complete heart block, accelerated junctional rhythm and history of pericarditis. Though has bradycardia, he is asymptomatic and not needed pacer (Followed by Dr. Cristopher Peru EP)  The patient has systolic HF and is only on SGLT2. Beta blocker is not possible due to heart block. ACE was stopped at some point for reasons unclear.  Still short of breath with physical activity although not worse than when last seen.  He has back today after taking [place] for 2 weeks.  He objects to the medication because it is too expensive.  They sent a notification that a formulary alternative his Jardiance, which we will switch to, 10 mg/day.  Past Medical History:  Diagnosis Date   Blastocystis hominis    Bradycardia 07/18/2013   CAD (coronary artery disease)    S/P inferior MI 07/2000 with RCA stenting at that time. In 03/2006 he had a LAD non-drug-eluting stent implanted.   Chronic insomnia    Complex renal cyst    Left   Diverticulitis    ED (erectile dysfunction)    Vacuum pump   Essential hypertension, benign    Stable, on lisinopril-HCTZ & Atenolol.   GERD (gastroesophageal reflux disease)    H/O pericarditis    Hemorrhoids    LBBB (left bundle branch block)    Multiple facial fractures ([place]) 02/25/2016   Multiple rib fractures 09/25/10   PTX 09/2010   Nephrolithiasis    Renal stone basketing.   Pure hypercholesterolemia    On Red Yeast Rice & CoQ 10.   Sinoatrial node dysfunction (HCC)    EC Holter 24hr  06/24/13 showed sinus bradycardia with probable chronotropic incompetence. May need pacemaker per Dr. Daneen Schick.   Skin cancer    Left base (Face) & (Ear in 12/2011).    Past Surgical History:  Procedure Laterality Date   CORONARY ANGIOPLASTY WITH STENT PLACEMENT  07/25/00   MI in 07/25/00 with RCA stenting at that time.   CORONARY ANGIOPLASTY WITH STENT PLACEMENT  04/14/06   Bare metal stent in proximal LAD [facility] - 3.5 x16 stent) by Dr. Linard Millers.   PROSTATE BIOPSY     Evidence   RIGHT THORACOTOMY,OPEN REDUCTION AND INTERNAL FIXATION OF RIBS 4-7  09/25/2010   STONE EXTRACTION WITH BASKET     Renal    Current Medications: No outpatient medications have been marked as taking for the 09/13/21 encounter (Appointment) with Belva Crome, MD.     Allergies:   Vitamin d analogs, Fish oil, Ambien [zolpidem tartrate], and Honey   Social History   Socioeconomic History   Marital status: Single    Spouse name: Not on file   Number of children: Not on file   Years of education: Not on file   Highest education level: Not on file  Occupational History   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: RETIRED  Tobacco Use   Smoking status: Former    Types: Cigars   Smokeless tobacco: Never  Tobacco comments:    quit 1990  Vaping Use   Vaping Use: Never used  Substance and Sexual Activity   Alcohol use: Yes    Comment: NOT ON A REGULAR BASIS   Drug use: No   Sexual activity: Not on file  Other Topics Concern   Not on file  Social History Narrative   Not on file   Social Determinants of Health   Financial Resource Strain: Not on file  Food Insecurity: Not on file  Transportation Needs: Not on file  Physical Activity: Not on file  Stress: Not on file  Social Connections: Not on file     Family History: The patient's family history includes Aneurysm in his sister; CVA in his mother; Diabetes in his brother; Heart attack in his father; Heart disease in his brother and  sister; Heart failure in his brother; Hypertension in his brother; Kidney failure in his father; Lung disease in his brother.  ROS:   Please see the history of present illness.    Still living independently all other systems reviewed and are negative.  EKGs/Labs/Other Studies Reviewed:    The following studies were reviewed today: ECIMPRESSIONS     1. Left ventricular ejection fraction, by estimation, is 30 to 35%. The  left ventricle has moderately decreased function. The left ventricle  demonstrates regional wall motion abnormalities (see scoring  diagram/findings for description). Left ventricular   diastolic function could not be evaluated.   2. Right ventricular systolic function is normal. The right ventricular  size is mildly enlarged. There is severely elevated pulmonary artery  systolic pressure. The estimated right ventricular systolic pressure is  76.2 mmHg.   3. Left atrial size was mild to moderately dilated.   4. Right atrial size was mild to moderately dilated.   5. The mitral valve is normal in structure. Moderate mitral valve  regurgitation.   6. Tricuspid valve regurgitation is moderate to severe.   7. The aortic valve is tricuspid. There is mild calcification of the  aortic valve. There is mild thickening of the aortic valve. Aortic valve  regurgitation is mild. Mild aortic valve sclerosis is present, with no  evidence of aortic valve stenosis.   8. The inferior vena cava is normal in size with <50% respiratory  variability, suggesting right atrial pressure of 8 mmHg.   Comparison(s): No prior Echocardiogram.   Conclusion(s)/Recommendation(s): LVEF reduced, with focal wall motion  abnormalities as noted. Slow echo contrast movement at the LV apex, but no  thrombus seen. Severely elevated right sided filling pressures. HOCARDIOGRAM 07/2021  EKG:  EKG not repeated  Recent Labs: 07/26/2021: ALT 22; Magnesium 1.9 08/17/2021: B Natriuretic Peptide 719.2; BUN  28; Creatinine, Ser 1.48; Hemoglobin 11.8; Platelets 185; Potassium 3.7; Sodium 138  Recent Lipid Panel    Component Value Date/Time   CHOL 140 06/30/2014 1025   TRIG 48.0 06/30/2014 1025   HDL 45.80 06/30/2014 1025   CHOLHDL 3 06/30/2014 1025   VLDL 9.6 06/30/2014 1025   LDLCALC 85 06/30/2014 1025    Physical Exam:    VS:  There were no vitals taken for this visit.    Wt Readings from Last 3 Encounters:  09/01/21 159 lb (72.1 kg)  07/28/21 158 lb 6.4 oz (71.8 kg)  10/23/20 165 lb (74.8 kg)     GEN: Elderly. No acute distress HEENT: Normal NECK: No JVD. LYMPHATICS: No lymphadenopathy CARDIAC: No murmur. RRR no gallop, or edema. VASCULAR:  Normal Pulses. No bruits. RESPIRATORY:  Clear  to auscultation without rales, wheezing or rhonchi  ABDOMEN: Soft, non-tender, non-distended, No pulsatile mass, MUSCULOSKELETAL: No deformity  SKIN: Warm and dry NEUROLOGIC:  Alert and oriented x 3 PSYCHIATRIC:  Normal affect   ASSESSMENT:    1. Chronic systolic heart failure ([place])   2. Complete heart block, transient ([place])   3. Primary hypertension   4. Coronary artery disease involving native coronary artery of native heart without angina pectoris   5. Mixed hyperlipidemia    PLAN:    In order of problems listed above:  On Entresto 24/26 mg once per day.  Bmet will be done today and then again in 3 weeks.  Continue/switch over to Jardiance 10 mg/day.  May be able to cut back on the dose of furosemide depending upon today's blood work. Unable to use beta-blocker therapy due to heart block Relatively low blood pressures previously however, today while standing blood pressure is 162/70.  Initial check-in blood pressure 124/82.  Because of this we will start a suboptimal dose of Entresto at bedtime 24/26 mg daily. No symptoms of angina Not discussed   Medication Adjustments/Labs and Tests Ordered: Current medicines are reviewed at length with the patient today.  Concerns regarding  medicines are outlined above.  No orders of the defined types were placed in this encounter.  No orders of the defined types were placed in this encounter.   There are no Patient Instructions on file for this visit.   Signed, Sinclair Grooms, MD  09/12/2021 5:26 PM    [place]

## 2021-09-13 ENCOUNTER — Ambulatory Visit: Payer: Medicare Other | Admitting: Interventional Cardiology

## 2021-09-13 ENCOUNTER — Encounter: Payer: Self-pay | Admitting: Interventional Cardiology

## 2021-09-13 ENCOUNTER — Other Ambulatory Visit: Payer: Self-pay

## 2021-09-13 VITALS — BP 144/62 | HR 48 | Ht 67.0 in | Wt 157.0 lb

## 2021-09-13 DIAGNOSIS — I5022 Chronic systolic (congestive) heart failure: Secondary | ICD-10-CM | POA: Diagnosis not present

## 2021-09-13 DIAGNOSIS — I2510 Atherosclerotic heart disease of native coronary artery without angina pectoris: Secondary | ICD-10-CM | POA: Diagnosis not present

## 2021-09-13 DIAGNOSIS — I10 Essential (primary) hypertension: Secondary | ICD-10-CM | POA: Diagnosis not present

## 2021-09-13 DIAGNOSIS — I442 Atrioventricular block, complete: Secondary | ICD-10-CM

## 2021-09-13 DIAGNOSIS — E782 Mixed hyperlipidemia: Secondary | ICD-10-CM

## 2021-09-13 LAB — BASIC METABOLIC PANEL
BUN/Creatinine Ratio: 18 (ref 10–24)
BUN: 27 mg/dL (ref 10–36)
CO2: 25 mmol/L (ref 20–29)
Calcium: 9.4 mg/dL (ref 8.6–10.2)
Chloride: 105 mmol/L (ref 96–106)
Creatinine, Ser: 1.48 mg/dL — ABNORMAL HIGH (ref 0.76–1.27)
Glucose: 117 mg/dL — ABNORMAL HIGH (ref 70–99)
Potassium: 4.5 mmol/L (ref 3.5–5.2)
Sodium: 143 mmol/L (ref 134–144)
eGFR: 44 mL/min/{1.73_m2} — ABNORMAL LOW (ref >59)

## 2021-09-13 MED ORDER — FUROSEMIDE 20 MG PO TABS: 20.0000 mg | ORAL_TABLET | Freq: Every day | ORAL | 3 refills | Status: DC

## 2021-09-13 MED ORDER — EMPAGLIFLOZIN 10 MG PO TABS: 10.0000 mg | ORAL_TABLET | Freq: Every day | ORAL | 11 refills | Status: DC

## 2021-09-13 MED ORDER — ENTRESTO 24-26 MG PO TABS: 1.0000 | ORAL_TABLET | Freq: Two times a day (BID) | ORAL | 11 refills | Status: DC

## 2021-09-13 NOTE — Patient Instructions (Signed)
Medication Instructions:  1) STOP Farxiga 2) START Jardiance 10mg  once daily 3) START Entresto 24/26mg  once daily at night.  We will discuss increasing it at your next appointment if able to.  *If you need a refill on your cardiac medications before your next appointment, please call your pharmacy*   Lab Work: BMET today  BMET in 2-3 weeks  If you have labs (blood work) drawn today and your tests are completely normal, you will receive your results only by: [place] (if you have MyChart) OR A paper copy in the mail If you have any lab test that is abnormal or we need to change your treatment, we will call you to review the results.   Testing/Procedures: None   Follow-Up: At [facility], you and your health needs are our priority.  As part of our continuing mission to provide you with exceptional heart care, we have created designated Provider Care Teams.  These Care Teams include your primary Cardiologist (physician) and Advanced Practice Providers (APPs -  Physician Assistants and Nurse Practitioners) who all work together to provide you with the care you need, when you need it.  We recommend signing up for the patient portal called "MyChart".  Sign up information is provided on this After Visit Summary.  MyChart is used to connect with patients for Virtual Visits (Telemedicine).  Patients are able to view lab/test results, encounter notes, upcoming appointments, etc.  Non-urgent messages can be sent to your provider as well.   To learn more about what you can do with MyChart, go to [email or web address].    Your next appointment:   1 month(s)  The format for your next appointment:   In Person  Provider:   Sinclair Grooms, MD     Other Instructions

## 2021-09-13 NOTE — Telephone Encounter (Signed)
Pt in today to see Dr. Tamala Julian and brought in letter from insurance that shows denial for Non-Formulary coverage of Farxiga.  They prefer he try Jardiance.  We will change pt to [place] today.

## 2021-10-05 ENCOUNTER — Other Ambulatory Visit: Payer: Self-pay

## 2021-10-05 ENCOUNTER — Other Ambulatory Visit: Payer: Medicare Other

## 2021-10-05 DIAGNOSIS — I5022 Chronic systolic (congestive) heart failure: Secondary | ICD-10-CM

## 2021-10-06 ENCOUNTER — Telehealth: Payer: Self-pay | Admitting: Interventional Cardiology

## 2021-10-06 LAB — BASIC METABOLIC PANEL
BUN/Creatinine Ratio: 20 (ref 10–24)
BUN: 27 mg/dL (ref 10–36)
CO2: 27 mmol/L (ref 20–29)
Calcium: 9.7 mg/dL (ref 8.6–10.2)
Chloride: 105 mmol/L (ref 96–106)
Creatinine, Ser: 1.37 mg/dL — ABNORMAL HIGH (ref 0.76–1.27)
Glucose: 79 mg/dL (ref 70–99)
Potassium: 4.6 mmol/L (ref 3.5–5.2)
Sodium: 144 mmol/L (ref 134–144)
eGFR: 48 mL/min/{1.73_m2} — ABNORMAL LOW (ref >59)

## 2021-10-06 MED ORDER — FUROSEMIDE 20 MG PO TABS: 20.0000 mg | ORAL_TABLET | Freq: Every day | ORAL | 3 refills | Status: DC

## 2021-10-06 NOTE — Telephone Encounter (Signed)
°*  STAT* If patient is at the pharmacy, call can be transferred to refill team.   1. Which medications need to be refilled? (please list name of each medication and dose if known) furosemide (LASIX) 20 MG tablet  2. Which pharmacy/location (including street and city if local pharmacy) is medication to be sent to? [place], [place], [place]  3. Do they need a 30 day or 90 day supply? [place]

## 2021-10-06 NOTE — Telephone Encounter (Signed)
Pt's medication was sent to pt's pharmacy as requested. Confirmation received.  °

## 2021-10-11 DIAGNOSIS — I5033 Acute on chronic diastolic (congestive) heart failure: Secondary | ICD-10-CM | POA: Diagnosis not present

## 2021-10-18 NOTE — Progress Notes (Signed)
Cardiology Office Note:    Date:  10/20/2021   ID:  Brandon Robles, DOB 05-30-1926, MRN [id number]  PCP:  Lavone Orn, MD  Cardiologist:  Sinclair Grooms, MD   Referring MD: Lavone Orn, MD   Chief Complaint  Patient presents with   Coronary Artery Disease   Congestive Heart Failure    History of Present Illness:    Brandon Robles is a 88 y.o. male with a hx of coronary artery disease with prior non-drug-eluting stent implantation during acute inferior in 2001and LAD bare-metal stent 2007, hypertension, hyperlipidemia, sinus node dysfunction, complete heart block, accelerated junctional rhythm and history of pericarditis. Though has bradycardia, he is asymptomatic and not needed pacer (Followed by Dr. Cristopher Peru EP)  He is doing okay.  He is tolerating twice daily Entresto and Jardiance.  Recent laboratory data has been stable with creatinine of 1.37 December 20.  He denies orthopnea or PND.  He has not had syncope.  He denies angina.  No particular medication side effects are noted.  We are unable to use beta-blocker therapy because of AV block.  He is on low-dose furosemide.  Have not added an MRA.  Past Medical History:  Diagnosis Date   Blastocystis hominis    Bradycardia 07/18/2013   CAD (coronary artery disease)    S/P inferior MI 07/2000 with RCA stenting at that time. In 03/2006 he had a LAD non-drug-eluting stent implanted.   Chronic insomnia    Complex renal cyst    Left   Diverticulitis    ED (erectile dysfunction)    Vacuum pump   Essential hypertension, benign    Stable, on lisinopril-HCTZ & Atenolol.   GERD (gastroesophageal reflux disease)    H/O pericarditis    Hemorrhoids    LBBB (left bundle branch block)    Multiple facial fractures ([place]) 02/25/2016   Multiple rib fractures 09/25/10   PTX 09/2010   Nephrolithiasis    Renal stone basketing.   Pure hypercholesterolemia    On Red Yeast Rice & CoQ 10.   Sinoatrial node dysfunction (HCC)    EC Holter 24hr  06/24/13 showed sinus bradycardia with probable chronotropic incompetence. May need pacemaker per Dr. Daneen Schick.   Skin cancer    Left base (Face) & (Ear in 12/2011).    Past Surgical History:  Procedure Laterality Date   CORONARY ANGIOPLASTY WITH STENT PLACEMENT  07/25/00   MI in 07/25/00 with RCA stenting at that time.   CORONARY ANGIOPLASTY WITH STENT PLACEMENT  04/14/06   Bare metal stent in proximal LAD [facility] - 3.5 x16 stent) by Dr. Linard Millers.   PROSTATE BIOPSY     Evidence   RIGHT THORACOTOMY,OPEN REDUCTION AND INTERNAL FIXATION OF RIBS 4-7  09/25/2010   STONE EXTRACTION WITH BASKET     Renal    Current Medications: Current Meds  Medication Sig   acetaminophen (TYLENOL) 325 MG tablet Take 2 tablets (650 mg total) by mouth every 4 (four) hours as needed for mild pain.   amoxicillin (AMOXIL) 500 MG capsule Take 4 capsules by mouth as needed 1 hour prior to dental procedures.   aspirin EC 81 MG tablet Take 81 mg by mouth daily.   co-enzyme Q-10 30 MG capsule Take 30 mg by mouth daily.   empagliflozin (JARDIANCE) 10 MG TABS tablet Take 1 tablet (10 mg total) by mouth daily before breakfast.   furosemide (LASIX) 20 MG tablet Take 1 tablet (20 mg total) by mouth  daily.   nitroGLYCERIN (NITROSTAT) 0.4 MG SL tablet Place 0.4 mg under the tongue every 5 (five) minutes as needed for chest pain. (MAXIMUM of 3 TABLETS)   OVER THE COUNTER MEDICATION Take 2 capsules by mouth daily. OTC [place] capsules   OVER THE COUNTER MEDICATION Take 1 capsule by mouth daily. OMEGA Q PLUS   phenylephrine (SUDAFED PE) 10 MG TABS tablet Take 10 mg by mouth every 6 (six) hours as needed (nasal congestion).   Red Yeast Rice 600 MG CAPS Take 600 mg by mouth daily.   sacubitril-valsartan (ENTRESTO) 24-26 MG Take 1 tablet by mouth 2 (two) times daily.   sodium chloride (OCEAN) 0.65 % SOLN nasal spray Place 1 spray into both nostrils daily as needed for congestion.   temazepam (RESTORIL) 30 MG  capsule Take 30 mg by mouth at bedtime.   Throat Lozenges [facility] FRIEND MT) Use as directed 1 lozenge in the mouth or throat 2 (two) times daily as needed (congestion).     Allergies:   Vitamin d analogs, Fish oil, Ambien [zolpidem tartrate], and Honey   Social History   Socioeconomic History   Marital status: Single    Spouse name: Not on file   Number of children: Not on file   Years of education: Not on file   Highest education level: Not on file  Occupational History   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: RETIRED  Tobacco Use   Smoking status: Former    Types: Cigars   Smokeless tobacco: Never   Tobacco comments:    quit 1990  Vaping Use   Vaping Use: Never used  Substance and Sexual Activity   Alcohol use: Yes    Comment: NOT ON A REGULAR BASIS   Drug use: No   Sexual activity: Not on file  Other Topics Concern   Not on file  Social History Narrative   Not on file   Social Determinants of Health   Financial Resource Strain: Not on file  Food Insecurity: Not on file  Transportation Needs: Not on file  Physical Activity: Not on file  Stress: Not on file  Social Connections: Not on file     Family History: The patient's family history includes Aneurysm in his sister; CVA in his mother; Diabetes in his brother; Heart attack in his father; Heart disease in his brother and sister; Heart failure in his brother; Hypertension in his brother; Kidney failure in his father; Lung disease in his brother.  ROS:   Please see the history of present illness.    Decreased hearing.  All other systems reviewed and are negative.  EKGs/Labs/Other Studies Reviewed:    The following studies were reviewed today:   2 D Echocardiography 07/26/2021: IMPRESSIONS   1. Left ventricular ejection fraction, by estimation, is 30 to 35%. The  left ventricle has moderately decreased function. The left ventricle  demonstrates regional wall motion abnormalities (see scoring   diagram/findings for description). Left ventricular   diastolic function could not be evaluated.   2. Right ventricular systolic function is normal. The right ventricular  size is mildly enlarged. There is severely elevated pulmonary artery  systolic pressure. The estimated right ventricular systolic pressure is  70.9 mmHg.   3. Left atrial size was mild to moderately dilated.   4. Right atrial size was mild to moderately dilated.   5. The mitral valve is normal in structure. Moderate mitral valve  regurgitation.   6. Tricuspid valve regurgitation is moderate to severe.  7. The aortic valve is tricuspid. There is mild calcification of the  aortic valve. There is mild thickening of the aortic valve. Aortic valve  regurgitation is mild. Mild aortic valve sclerosis is present, with no  evidence of aortic valve stenosis.   8. The inferior vena cava is normal in size with <50% respiratory  variability, suggesting right atrial pressure of 8 mmHg.   Comparison(s): No prior Echocardiogram.   Conclusion(s)/Recommendation(s): LVEF reduced, with focal wall motion  abnormalities as noted. Slow echo contrast movement at the LV apex, but no  thrombus seen. Severely elevated right sided filling pressures.   EKG:  EKG performed  Recent Labs: 07/26/2021: ALT 22; Magnesium 1.9 08/17/2021: B Natriuretic Peptide 719.2; Hemoglobin 11.8; Platelets 185 10/05/2021: BUN 27; Creatinine, Ser 1.37; Potassium 4.6; Sodium 144  Recent Lipid Panel    Component Value Date/Time   CHOL 140 06/30/2014 1025   TRIG 48.0 06/30/2014 1025   HDL 45.80 06/30/2014 1025   CHOLHDL 3 06/30/2014 1025   VLDL 9.6 06/30/2014 1025   LDLCALC 85 06/30/2014 1025    Physical Exam:    VS:  BP (!) 188/62    Pulse (!) 50    Ht 5\' 7"  (1.702 m)    Wt 160 lb 6.4 oz (72.8 kg)    SpO2 96%    BMI 25.12 kg/m     Wt Readings from Last 3 Encounters:  10/20/21 160 lb 6.4 oz (72.8 kg)  09/13/21 157 lb (71.2 kg)  09/01/21 159 lb (72.1  kg)     GEN: Elderly but not frail. No acute distress HEENT: Normal NECK: No JVD. LYMPHATICS: No lymphadenopathy CARDIAC: 2/6 systolic left lower sternal murmur. IRR no gallop, or edema. VASCULAR:  Normal Pulses. No bruits. RESPIRATORY:  Clear to auscultation without rales, wheezing or rhonchi  ABDOMEN: Soft, non-tender, non-distended, No pulsatile mass, MUSCULOSKELETAL: No deformity  SKIN: Warm and dry NEUROLOGIC:  Alert and oriented x 3 PSYCHIATRIC:  Normal affect   ASSESSMENT:    1. Chronic systolic heart failure ([place])   2. Complete heart block, transient ([place])   3. Primary hypertension   4. Coronary artery disease involving native coronary artery of native heart without angina pectoris   5. Mixed hyperlipidemia   6. Essential hypertension    PLAN:    In order of problems listed above:  Now on 2 drug therapy for systolic heart failure including Entresto and Jardiance.  Beta-blocker therapy cannot be used due to AV conduction system disease.  Have not instituted MRA therapy.  He is New York Heart Association class II. Will follow with EP.  No syncope. Blood pressure is tolerating the heart failure therapy well.  He has brought a long list of blood pressure recordings since starting Entresto and for the most part his blood pressures at home are running in the 119- 147 mmHg systolic. Denies angina Continue encouragement given starting statin therapy.  Red yeast rice.  Has had intolerance to statins in the past.   Medication Adjustments/Labs and Tests Ordered: Current medicines are reviewed at length with the patient today.  Concerns regarding medicines are outlined above.  No orders of the defined types were placed in this encounter.  No orders of the defined types were placed in this encounter.   Patient Instructions  Medication Instructions:  Your physician recommends that you continue on your current medications as directed. Please refer to the Current Medication list  given to you today.  *If you need a refill on your cardiac  medications before your next appointment, please call your pharmacy*   Lab Work: None If you have labs (blood work) drawn today and your tests are completely normal, you will receive your results only by: [place] (if you have MyChart) OR A paper copy in the mail If you have any lab test that is abnormal or we need to change your treatment, we will call you to review the results.   Testing/Procedures: None   Follow-Up: At [facility], you and your health needs are our priority.  As part of our continuing mission to provide you with exceptional heart care, we have created designated Provider Care Teams.  These Care Teams include your primary Cardiologist (physician) and Advanced Practice Providers (APPs -  Physician Assistants and Nurse Practitioners) who all work together to provide you with the care you need, when you need it.  We recommend signing up for the patient portal called "MyChart".  Sign up information is provided on this After Visit Summary.  MyChart is used to connect with patients for Virtual Visits (Telemedicine).  Patients are able to view lab/test results, encounter notes, upcoming appointments, etc.  Non-urgent messages can be sent to your provider as well.   To learn more about what you can do with MyChart, go to [email or web address].    Your next appointment:   3-4 month(s)  The format for your next appointment:   In Person  Provider:   Sinclair Grooms, MD     Other Instructions     Signed, Sinclair Grooms, MD  10/20/2021 2:52 PM    [place]

## 2021-10-20 ENCOUNTER — Ambulatory Visit: Payer: Medicare Other | Admitting: Interventional Cardiology

## 2021-10-20 ENCOUNTER — Encounter: Payer: Self-pay | Admitting: Interventional Cardiology

## 2021-10-20 ENCOUNTER — Other Ambulatory Visit: Payer: Self-pay

## 2021-10-20 VITALS — BP 188/62 | HR 50 | Ht 67.0 in | Wt 160.4 lb

## 2021-10-20 DIAGNOSIS — I2510 Atherosclerotic heart disease of native coronary artery without angina pectoris: Secondary | ICD-10-CM | POA: Diagnosis not present

## 2021-10-20 DIAGNOSIS — I5022 Chronic systolic (congestive) heart failure: Secondary | ICD-10-CM

## 2021-10-20 DIAGNOSIS — I10 Essential (primary) hypertension: Secondary | ICD-10-CM

## 2021-10-20 DIAGNOSIS — I442 Atrioventricular block, complete: Secondary | ICD-10-CM

## 2021-10-20 DIAGNOSIS — E782 Mixed hyperlipidemia: Secondary | ICD-10-CM

## 2021-10-20 NOTE — Patient Instructions (Signed)
Medication Instructions:  Your physician recommends that you continue on your current medications as directed. Please refer to the Current Medication list given to you today.  *If you need a refill on your cardiac medications before your next appointment, please call your pharmacy*   Lab Work: None If you have labs (blood work) drawn today and your tests are completely normal, you will receive your results only by: [place] (if you have MyChart) OR A paper copy in the mail If you have any lab test that is abnormal or we need to change your treatment, we will call you to review the results.   Testing/Procedures: None   Follow-Up: At [facility], you and your health needs are our priority.  As part of our continuing mission to provide you with exceptional heart care, we have created designated Provider Care Teams.  These Care Teams include your primary Cardiologist (physician) and Advanced Practice Providers (APPs -  Physician Assistants and Nurse Practitioners) who all work together to provide you with the care you need, when you need it.  We recommend signing up for the patient portal called "MyChart".  Sign up information is provided on this After Visit Summary.  MyChart is used to connect with patients for Virtual Visits (Telemedicine).  Patients are able to view lab/test results, encounter notes, upcoming appointments, etc.  Non-urgent messages can be sent to your provider as well.   To learn more about what you can do with MyChart, go to [email or web address].    Your next appointment:   3-4 month(s)  The format for your next appointment:   In Person  Provider:   Sinclair Grooms, MD     Other Instructions

## 2021-11-05 DIAGNOSIS — I5020 Unspecified systolic (congestive) heart failure: Secondary | ICD-10-CM | POA: Diagnosis not present

## 2021-11-05 DIAGNOSIS — I10 Essential (primary) hypertension: Secondary | ICD-10-CM | POA: Diagnosis not present

## 2021-11-05 DIAGNOSIS — E7800 Pure hypercholesterolemia, unspecified: Secondary | ICD-10-CM | POA: Diagnosis not present

## 2021-11-05 DIAGNOSIS — I129 Hypertensive chronic kidney disease with stage 1 through stage 4 chronic kidney disease, or unspecified chronic kidney disease: Secondary | ICD-10-CM | POA: Diagnosis not present

## 2022-01-18 IMAGING — DX DG CHEST 2V
2 series · 2 of 2 positions shown · non-contrast
Comparison: July 25, 2021.

CLINICAL DATA: In a [AGE] male presents with shortness of
breath.

EXAM:
CHEST - 2 VIEW

[chest lat]
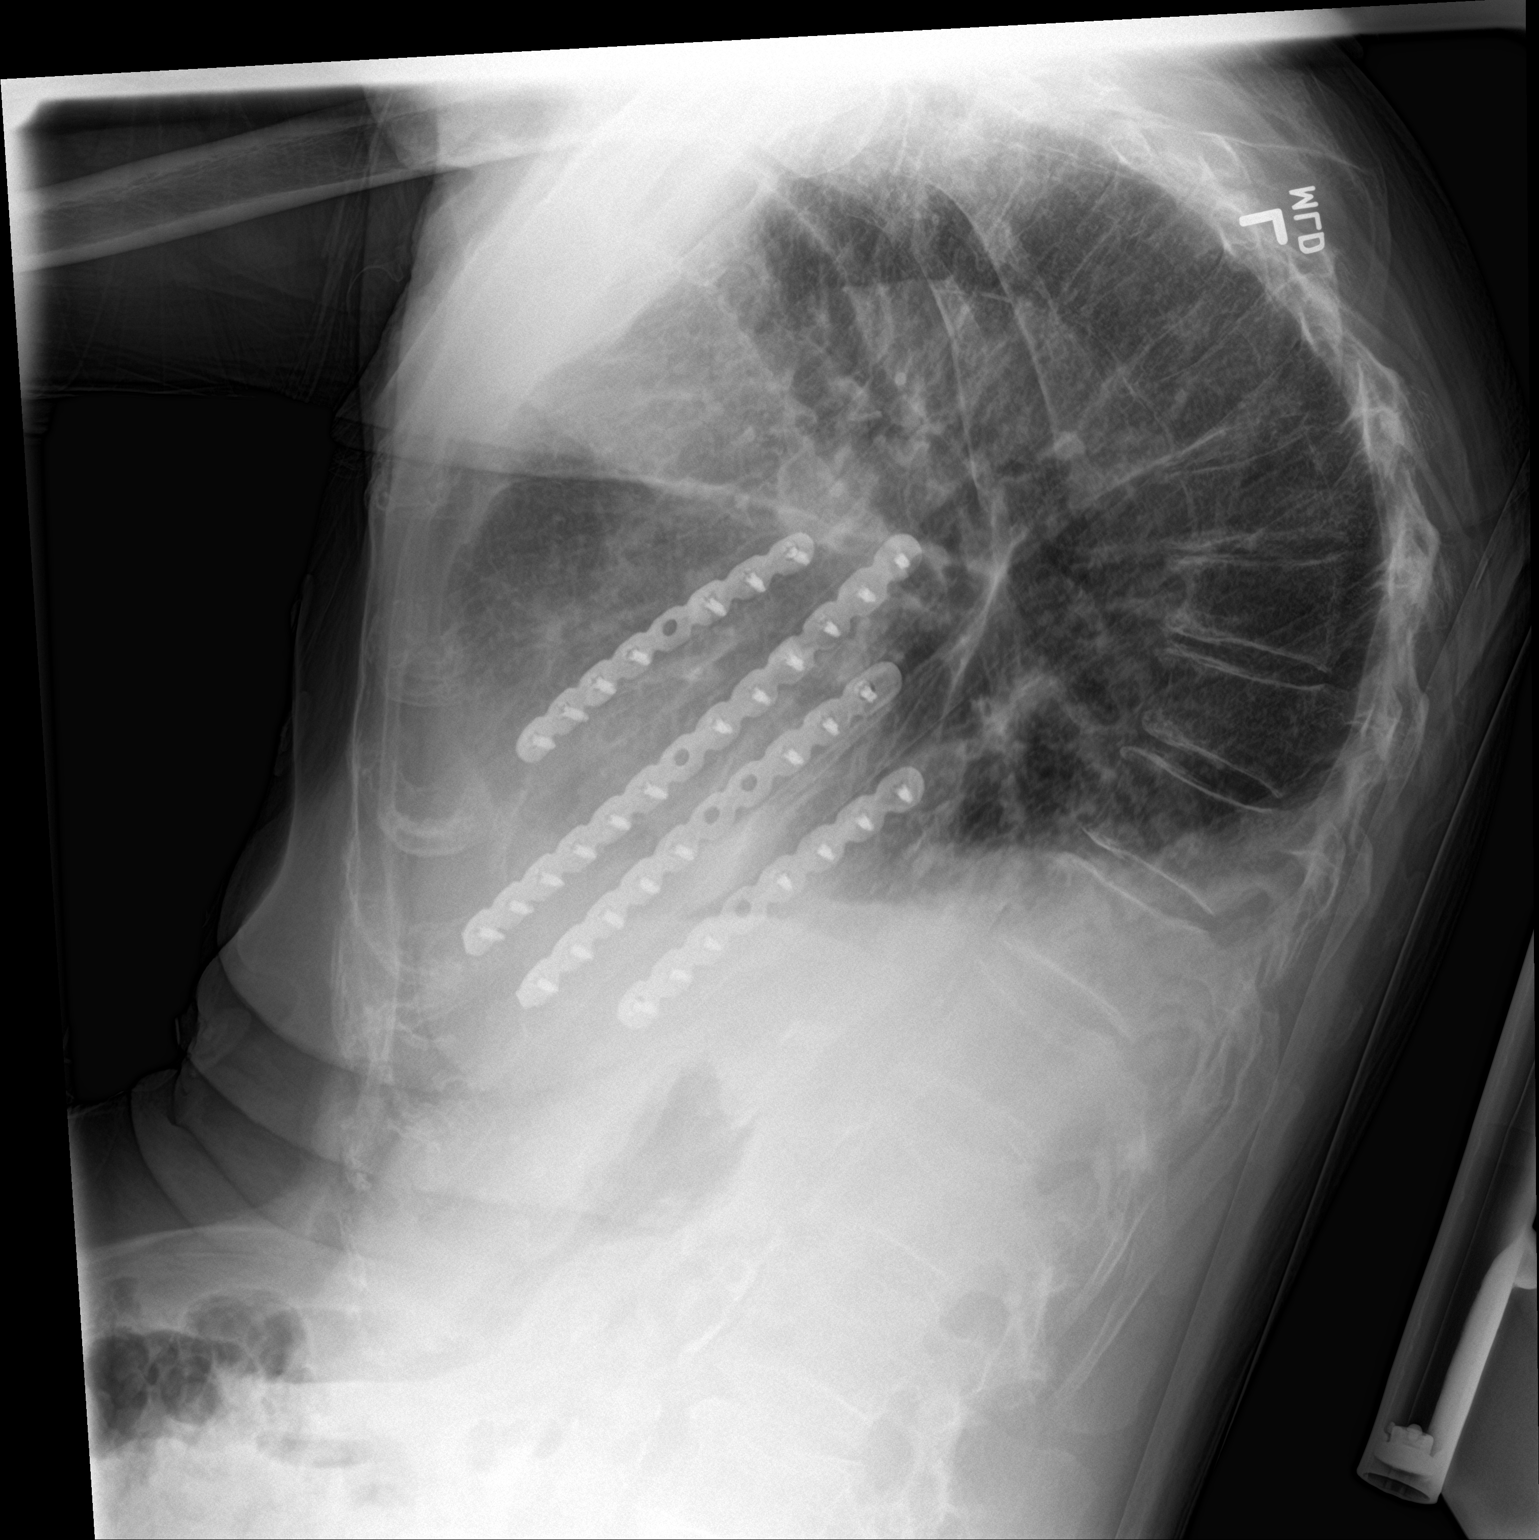

[chest ap]
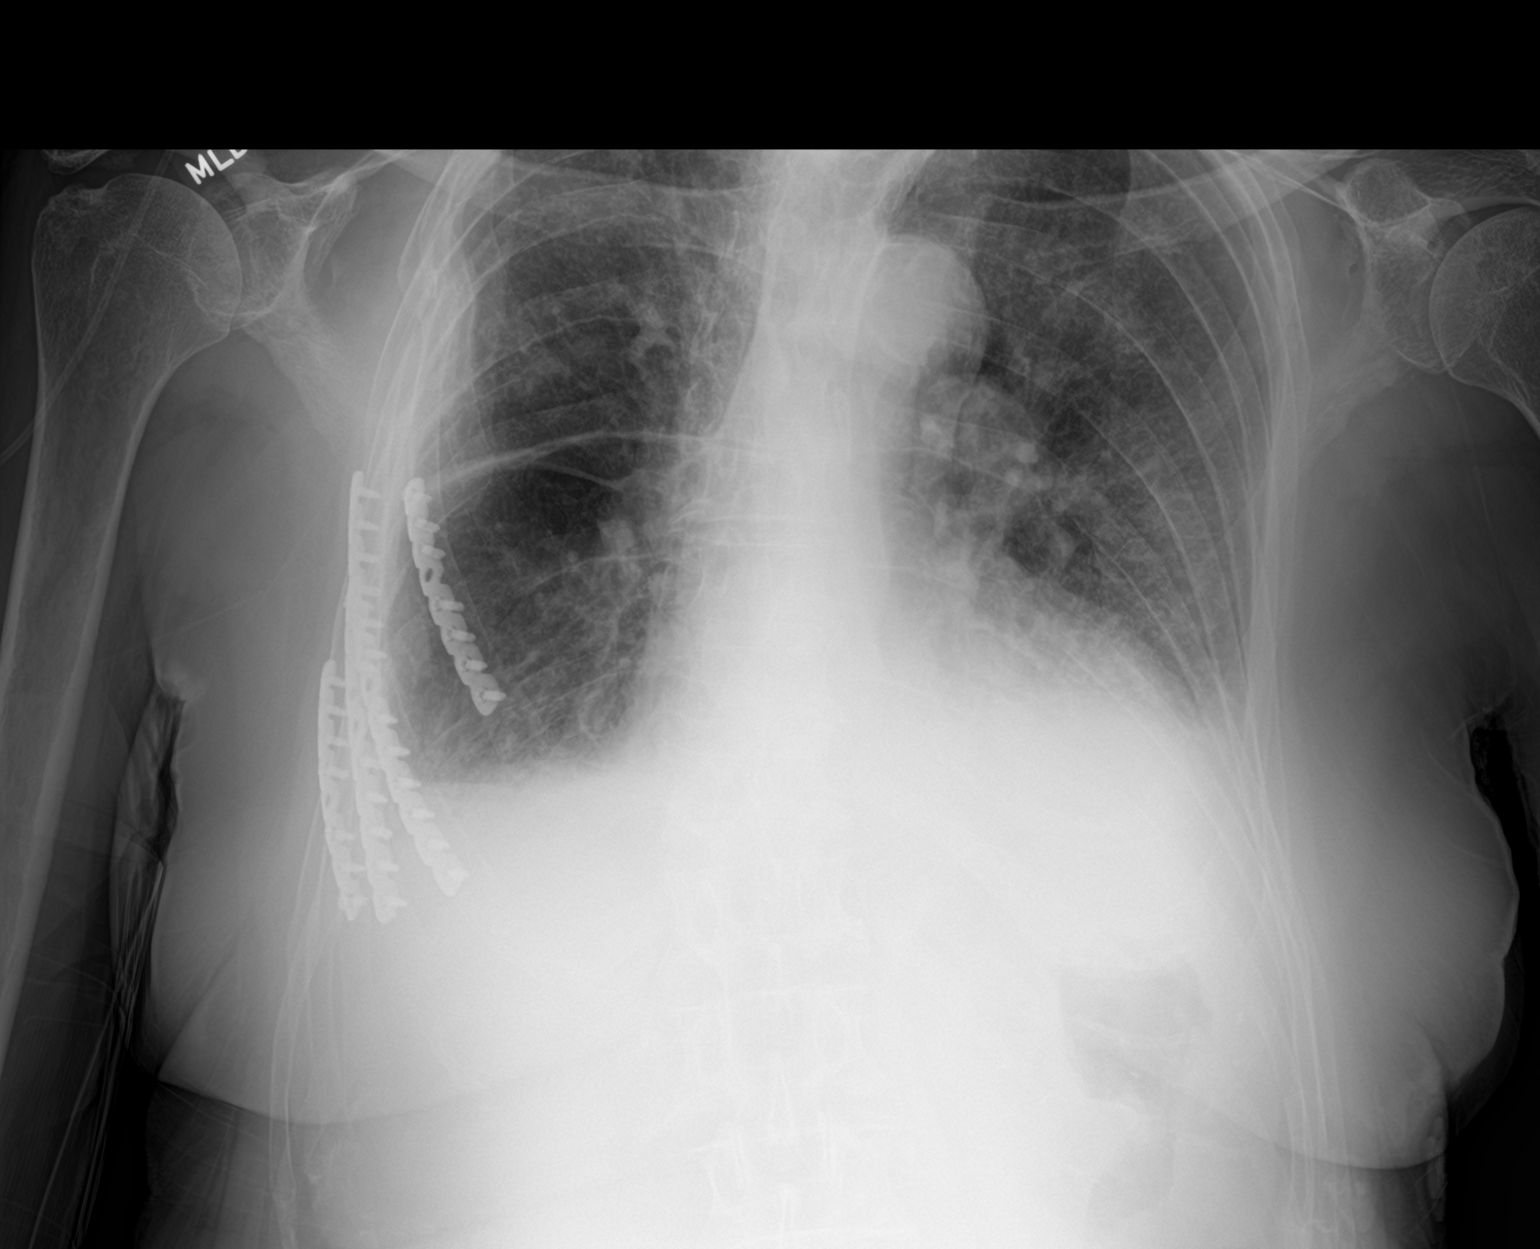

[2 of 2 positions shown; findings below may reference images not displayed]

FINDINGS: Trachea is midline. Image slightly rotated. Cardia of mediastinal
contours and hilar structures are stable with signs of cardiac
enlargement.

Diminished lung volumes with signs of basilar opacities and blunting
of RIGHT and LEFT costodiaphragmatic sulcus.

Fissural fluid in the minor fissure in the RIGHT chest is similar to
previous imaging. Mild increased interstitial markings in the
background also similar to prior exams.

Signs of prior RIGHT-sided rib fractures with cortical plate and
screw fixation along the RIGHT lateral chest as before
IMPRESSION: Increasing bilateral effusions in interstitial changes suggestive of
CHF or volume overload.

Cardiomegaly.

## 2022-01-19 DIAGNOSIS — H26493 Other secondary cataract, bilateral: Secondary | ICD-10-CM | POA: Diagnosis not present

## 2022-01-19 DIAGNOSIS — H353131 Nonexudative age-related macular degeneration, bilateral, early dry stage: Secondary | ICD-10-CM | POA: Diagnosis not present

## 2022-02-07 NOTE — Progress Notes (Signed)
?Cardiology Office Note:   ? ?Date:  02/08/2022  ? ?ID:  ELDOR CONAWAY, DOB [birth year], MRN [id number] ? ?PCP:  Lavone Orn, MD  ?Cardiologist:  Sinclair Grooms, MD  ? ?Referring MD: Lavone Orn, MD  ? ?Chief Complaint  ?Patient presents with  ? Coronary Artery Disease  ? Hypertension  ? Hyperlipidemia  ? ? ?History of Present Illness:   ? ?Brandon Robles is a 88 y.o. male with a hx of coronary artery disease with prior non-drug-eluting stent implantation during acute inferior in 2001and LAD bare-metal stent 2007, hypertension, hyperlipidemia, sinus node dysfunction, complete heart block, accelerated junctional rhythm and history of pericarditis. Though has bradycardia, he is asymptomatic and not needed pacer (Followed by Dr. Cristopher Peru EP). ? ? ?He is doing well.  He is appreciative of the recent adjustments in medications that have allowed him to have increased endurance and not be short of breath with activity.  He does feel that one of the new medications causes him to have a blue halo around his television at night when he is watching.  He feels this may be Entresto.  He denies orthopnea.  He denies lower extremity edema.  Exertional tolerance is improved. ? ?Past Medical History:  ?Diagnosis Date  ? Blastocystis hominis   ? Bradycardia 07/18/2013  ? CAD (coronary artery disease)   ? S/P inferior MI 07/2000 with RCA stenting at that time. In 03/2006 he had a LAD non-drug-eluting stent implanted.  ? Chronic insomnia   ? Complex renal cyst   ? Left  ? Diverticulitis   ? ED (erectile dysfunction)   ? Vacuum pump  ? Essential hypertension, benign   ? Stable, on lisinopril-HCTZ & Atenolol.  ? GERD (gastroesophageal reflux disease)   ? H/O pericarditis   ? Hemorrhoids   ? LBBB (left bundle branch block)   ? Multiple facial fractures ([place]) 02/25/2016  ? Multiple rib fractures 09/25/10  ? PTX 09/2010  ? Nephrolithiasis   ? Renal stone basketing.  ? Pure hypercholesterolemia   ? On Red Yeast Rice & CoQ 10.  ? Sinoatrial  node dysfunction (HCC)   ? EC Holter 24hr 06/24/13 showed sinus bradycardia with probable chronotropic incompetence. May need pacemaker per Dr. Daneen Schick.  ? Skin cancer   ? Left base (Face) & (Ear in 12/2011).  ? ? ?Past Surgical History:  ?Procedure Laterality Date  ? CORONARY ANGIOPLASTY WITH STENT PLACEMENT  07/25/00  ? MI in 07/25/00 with RCA stenting at that time.  ? CORONARY ANGIOPLASTY WITH STENT PLACEMENT  04/14/06  ? Bare metal stent in proximal LAD [facility] - 3.5 x16 stent) by Dr. Linard Millers.  ? PROSTATE BIOPSY    ? Evidence  ? RIGHT THORACOTOMY,OPEN REDUCTION AND INTERNAL FIXATION OF RIBS 4-7  09/25/2010  ? STONE EXTRACTION WITH BASKET    ? Renal  ? ? ?Current Medications: ?Current Meds  ?Medication Sig  ? acetaminophen (TYLENOL) 325 MG tablet Take 2 tablets (650 mg total) by mouth every 4 (four) hours as needed for mild pain.  ? amoxicillin (AMOXIL) 500 MG capsule Take 4 capsules by mouth as needed 1 hour prior to dental procedures.  ? aspirin EC 81 MG tablet Take 81 mg by mouth daily.  ? co-enzyme Q-10 30 MG capsule Take 30 mg by mouth daily.  ? empagliflozin (JARDIANCE) 10 MG TABS tablet Take 1 tablet (10 mg total) by mouth daily before breakfast.  ? furosemide (LASIX) 20 MG tablet Take 1  tablet (20 mg total) by mouth daily.  ? nitroGLYCERIN (NITROSTAT) 0.4 MG SL tablet Place 0.4 mg under the tongue every 5 (five) minutes as needed for chest pain. (MAXIMUM of 3 TABLETS)  ? OVER THE COUNTER MEDICATION Take 2 capsules by mouth daily. OTC [place] capsules  ? OVER THE COUNTER MEDICATION Take 1 capsule by mouth daily. OMEGA Q PLUS  ? phenylephrine (SUDAFED PE) 10 MG TABS tablet Take 10 mg by mouth every 6 (six) hours as needed (nasal congestion).  ? Red Yeast Rice 600 MG CAPS Take 600 mg by mouth daily.  ? sacubitril-valsartan (ENTRESTO) 24-26 MG Take 1 tablet by mouth 2 (two) times daily.  ? sodium chloride (OCEAN) 0.65 % SOLN nasal spray Place 1 spray into both nostrils daily as needed for  congestion.  ? temazepam (RESTORIL) 30 MG capsule Take 30 mg by mouth at bedtime.  ? Throat Lozenges [facility] [place]) Use as directed 1 lozenge in the mouth or throat 2 (two) times daily as needed (congestion).  ?  ? ?Allergies:   Vitamin d analogs, Fish oil, Ambien [zolpidem tartrate], and Honey  ? ?Social History  ? ?Socioeconomic History  ? Marital status: Single  ?  Spouse name: Not on file  ? Number of children: Not on file  ? Years of education: Not on file  ? Highest education level: Not on file  ?Occupational History  ? Occupation: Farming  ?  Employer: RETIRED  ?Tobacco Use  ? Smoking status: Former  ?  Types: Cigars  ? Smokeless tobacco: Never  ? Tobacco comments:  ?  quit 1990  ?Vaping Use  ? Vaping Use: Never used  ?Substance and Sexual Activity  ? Alcohol use: Yes  ?  Comment: NOT ON A REGULAR BASIS  ? Drug use: No  ? Sexual activity: Not on file  ?Other Topics Concern  ? Not on file  ?Social History Narrative  ? Not on file  ? ?Social Determinants of Health  ? ?Financial Resource Strain: Not on file  ?Food Insecurity: Not on file  ?Transportation Needs: Not on file  ?Physical Activity: Not on file  ?Stress: Not on file  ?Social Connections: Not on file  ?  ? ?Family History: ?The patient's family history includes Aneurysm in his sister; CVA in his mother; Diabetes in his brother; Heart attack in his father; Heart disease in his brother and sister; Heart failure in his brother; Hypertension in his brother; Kidney failure in his father; Lung disease in his brother. ? ?ROS:   ?Please see the history of present illness.    ?Occasional blurred vision.  All other systems reviewed and are negative. ? ?EKGs/Labs/Other Studies Reviewed:   ? ?The following studies were reviewed today: ? ?ECHOCARDIOGRAM 07/26/2021 ?IMPRESSIONS  ? 1. Left ventricular ejection fraction, by estimation, is 30 to 35%. The  ?left ventricle has moderately decreased function. The left ventricle  ?demonstrates regional wall motion  abnormalities (see scoring  ?diagram/findings for description). Left ventricular  ? diastolic function could not be evaluated.  ? 2. Right ventricular systolic function is normal. The right ventricular  ?size is mildly enlarged. There is severely elevated pulmonary artery  ?systolic pressure. The estimated right ventricular systolic pressure is  ?34.1 mmHg.  ? 3. Left atrial size was mild to moderately dilated.  ? 4. Right atrial size was mild to moderately dilated.  ? 5. The mitral valve is normal in structure. Moderate mitral valve  ?regurgitation.  ? 6. Tricuspid valve regurgitation is  moderate to severe.  ? 7. The aortic valve is tricuspid. There is mild calcification of the  ?aortic valve. There is mild thickening of the aortic valve. Aortic valve  ?regurgitation is mild. Mild aortic valve sclerosis is present, with no  ?evidence of aortic valve stenosis.  ? 8. The inferior vena cava is normal in size with <50% respiratory  ?variability, suggesting right atrial pressure of 8 mmHg.  ? ?Comparison(s): No prior Echocardiogram.  ? ?Conclusion(s)/Recommendation(s): LVEF reduced, with focal wall motion  ?abnormalities as noted. Slow echo contrast movement at the LV apex, but no  ?thrombus seen. Severely elevated right sided filling pressures.  ? ?EKG:  EKG no new data ? ?Recent Labs: ?07/26/2021: ALT 22; Magnesium 1.9 ?08/17/2021: B Natriuretic Peptide 719.2; Hemoglobin 11.8; Platelets 185 ?10/05/2021: BUN 27; Creatinine, Ser 1.37; Potassium 4.6; Sodium 144  ?Recent Lipid Panel ?   ?Component Value Date/Time  ? CHOL 140 06/30/2014 1025  ? TRIG 48.0 06/30/2014 1025  ? HDL 45.80 06/30/2014 1025  ? CHOLHDL 3 06/30/2014 1025  ? VLDL 9.6 06/30/2014 1025  ? [place] 85 06/30/2014 1025  ? ? ?Physical Exam:   ? ?VS:  BP (!) 152/72   Pulse (!) 49   Ht '5\' 7"'$  (1.702 m)   Wt 154 lb 3.2 oz (69.9 kg)   SpO2 96%   BMI 24.15 kg/m?    ? ?Wt Readings from Last 3 Encounters:  ?02/08/22 154 lb (69.9 kg)  ?02/08/22 154 lb 3.2 oz (69.9  kg)  ?10/20/21 160 lb 6.4 oz (72.8 kg)  ?  ? ?GEN: Elderly and somewhat frail.. No acute distress ?HEENT: Normal ?NECK: No JVD. ?LYMPHATICS: No lymphadenopathy ?CARDIAC: Apical systolic mitral regurgitati

## 2022-02-08 ENCOUNTER — Encounter: Payer: Self-pay | Admitting: Interventional Cardiology

## 2022-02-08 ENCOUNTER — Encounter: Payer: Self-pay | Admitting: Internal Medicine

## 2022-02-08 ENCOUNTER — Ambulatory Visit (INDEPENDENT_AMBULATORY_CARE_PROVIDER_SITE_OTHER): Payer: Medicare Other | Admitting: Internal Medicine

## 2022-02-08 ENCOUNTER — Ambulatory Visit: Payer: Medicare Other | Admitting: Interventional Cardiology

## 2022-02-08 VITALS — BP 152/72 | HR 49 | Ht 67.0 in | Wt 154.0 lb

## 2022-02-08 VITALS — BP 152/72 | HR 49 | Ht 67.0 in | Wt 154.2 lb

## 2022-02-08 DIAGNOSIS — I2510 Atherosclerotic heart disease of native coronary artery without angina pectoris: Secondary | ICD-10-CM

## 2022-02-08 DIAGNOSIS — I10 Essential (primary) hypertension: Secondary | ICD-10-CM | POA: Diagnosis not present

## 2022-02-08 DIAGNOSIS — I5022 Chronic systolic (congestive) heart failure: Secondary | ICD-10-CM | POA: Diagnosis not present

## 2022-02-08 DIAGNOSIS — I5032 Chronic diastolic (congestive) heart failure: Secondary | ICD-10-CM | POA: Diagnosis not present

## 2022-02-08 DIAGNOSIS — I442 Atrioventricular block, complete: Secondary | ICD-10-CM

## 2022-02-08 DIAGNOSIS — E782 Mixed hyperlipidemia: Secondary | ICD-10-CM

## 2022-02-08 DIAGNOSIS — R001 Bradycardia, unspecified: Secondary | ICD-10-CM | POA: Diagnosis not present

## 2022-02-08 LAB — BASIC METABOLIC PANEL
BUN/Creatinine Ratio: 20 (ref 10–24)
BUN: 34 mg/dL (ref 10–36)
CO2: 25 mmol/L (ref 20–29)
Calcium: 9.9 mg/dL (ref 8.6–10.2)
Chloride: 102 mmol/L (ref 96–106)
Creatinine, Ser: 1.72 mg/dL — ABNORMAL HIGH (ref 0.76–1.27)
Glucose: 91 mg/dL (ref 70–99)
Potassium: 4.4 mmol/L (ref 3.5–5.2)
Sodium: 142 mmol/L (ref 134–144)
eGFR: 37 mL/min/{1.73_m2} — ABNORMAL LOW (ref >59)

## 2022-02-08 NOTE — Patient Instructions (Addendum)
Medication Instructions:  Your physician recommends that you continue on your current medications as directed. Please refer to the Current Medication list given to you today.  Labwork: None ordered.  Testing/Procedures: None ordered.  Follow-Up: Your physician wants you to follow-up as needed  Any Other Special Instructions Will Be Listed Below (If Applicable).  If you need a refill on your cardiac medications before your next appointment, please call your pharmacy.   Important Information About Sugar        

## 2022-02-08 NOTE — Progress Notes (Signed)
? ? ? ? ?HPI ?[name] returns today for followup of his bradycardia. He is a pleasant elderly woman with a h/o HTN, high grade heart block, and sinus bradycardia. 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 He remains active. He has not had syncope. 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 He denies anginal symptoms or sob. No syncope. He wore a cardiac monitor demonstrating heart block in the early morning for which he was asymptomatic.  ?Allergies  ?Allergen Reactions  ? Vitamin D Analogs Other (See Comments)  ?  "pins and needles feeling"  ? Fish Oil Other (See Comments)  ? Ambien [Zolpidem Tartrate] Other (See Comments)  ?  Confusion  ? Honey Other (See Comments)  ?  "pins and needles feeling"  ? ? ? ?Current Outpatient Medications  ?Medication Sig Dispense Refill  ? acetaminophen (TYLENOL) 325 MG tablet Take 2 tablets (650 mg total) by mouth every 4 (four) hours as needed for mild pain.    ? amoxicillin (AMOXIL) 500 MG capsule Take 4 capsules by mouth as needed 1 hour prior to dental procedures.  0  ? aspirin EC 81 MG tablet Take 81 mg by mouth daily.    ? co-enzyme Q-10 30 MG capsule Take 30 mg by mouth daily.    ? empagliflozin (JARDIANCE) 10 MG TABS tablet Take 1 tablet (10 mg total) by mouth daily before breakfast. 30 tablet 11  ? furosemide (LASIX) 20 MG tablet Take 1 tablet (20 mg total) by mouth daily. 90 tablet 3  ? nitroGLYCERIN (NITROSTAT) 0.4 MG SL tablet Place 0.4 mg under the tongue every 5 (five) minutes as needed for chest pain. (MAXIMUM of 3 TABLETS)    ? OVER THE COUNTER MEDICATION Take 2 capsules by mouth daily. OTC [place] capsules    ? OVER THE COUNTER MEDICATION Take 1 capsule by mouth daily. OMEGA Q PLUS    ? phenylephrine (SUDAFED PE) 10 MG TABS tablet Take 10 mg by mouth every 6 (six) hours as needed (nasal congestion).    ? Red Yeast Rice 600 MG CAPS Take 600 mg by mouth daily.    ? sacubitril-valsartan (ENTRESTO) 24-26 MG Take 1  tablet by mouth 2 (two) times daily. 60 tablet 11  ? sodium chloride (OCEAN) 0.65 % SOLN nasal spray Place 1 spray into both nostrils daily as needed for congestion.    ? temazepam (RESTORIL) 30 MG capsule Take 30 mg by mouth at bedtime.    ? Throat Lozenges [facility] [place]) Use as directed 1 lozenge in the mouth or throat 2 (two) times daily as needed (congestion).    ? ?No current facility-administered medications for this visit.  ? ? ? ?Past Medical History:  ?Diagnosis Date  ? Blastocystis hominis   ? Bradycardia 07/18/2013  ? CAD (coronary artery disease)   ? S/P inferior MI 07/2000 with RCA stenting at that time. In 03/2006 he had a LAD non-drug-eluting stent implanted.  ? Chronic insomnia   ? Complex renal cyst   ? Left  ? Diverticulitis   ? ED (erectile dysfunction)   ? Vacuum pump  ? Essential hypertension, benign   ? Stable, on lisinopril-HCTZ & Atenolol.  ? GERD (gastroesophageal reflux disease)   ? H/O pericarditis   ? Hemorrhoids   ? LBBB (left bundle branch block)   ? Multiple facial fractures ([place]) 02/25/2016  ? Multiple rib fractures 09/25/10  ? PTX 09/2010  ?  Nephrolithiasis   ? Renal stone basketing.  ? Pure hypercholesterolemia   ? On Red Yeast Rice & CoQ 10.  ? Sinoatrial node dysfunction (HCC)   ? EC Holter 24hr 06/24/13 showed sinus bradycardia with probable chronotropic incompetence. May need pacemaker per Dr. Daneen Schick.  ? Skin cancer   ? Left base (Face) & (Ear in 12/2011).  ? ? ?ROS: ? ? All systems reviewed and negative except as noted in the HPI. ? ? ?Past Surgical History:  ?Procedure Laterality Date  ? CORONARY ANGIOPLASTY WITH STENT PLACEMENT  07/25/00  ? MI in 07/25/00 with RCA stenting at that time.  ? CORONARY ANGIOPLASTY WITH STENT PLACEMENT  04/14/06  ? Bare metal stent in proximal LAD [facility] - 3.5 x16 stent) by Dr. Linard Millers.  ? PROSTATE BIOPSY    ? Evidence  ? RIGHT THORACOTOMY,OPEN REDUCTION AND INTERNAL FIXATION OF RIBS 4-7  09/25/2010  ? STONE EXTRACTION  WITH BASKET    ? Renal  ? ? ? ?Family History  ?Problem Relation Age of Onset  ? CVA Mother   ? Heart attack Father   ? Kidney failure Father   ? Heart disease Sister   ?     Possible heart disease  ? Aneurysm Sister   ? Hypertension Brother   ? Heart disease Brother   ? Heart failure Brother   ? Lung disease Brother   ? Diabetes Brother   ? ? ? ?Social History  ? ?Socioeconomic History  ? Marital status: Single  ?  Spouse name: Not on file  ? Number of children: Not on file  ? Years of education: Not on file  ? Highest education level: Not on file  ?Occupational History  ? Occupation: Farming  ?  Employer: RETIRED  ?Tobacco Use  ? Smoking status: Former  ?  Types: Cigars  ? Smokeless tobacco: Never  ? Tobacco comments:  ?  quit 1990  ?Vaping Use  ? Vaping Use: Never used  ?Substance and Sexual Activity  ? Alcohol use: Yes  ?  Comment: NOT ON A REGULAR BASIS  ? Drug use: No  ? Sexual activity: Not on file  ?Other Topics Concern  ? Not on file  ?Social History Narrative  ? Not on file  ? ?Social Determinants of Health  ? ?Financial Resource Strain: Not on file  ?Food Insecurity: Not on file  ?Transportation Needs: Not on file  ?Physical Activity: Not on file  ?Stress: Not on file  ?Social Connections: Not on file  ?Intimate Partner Violence: Not on file  ? ? ? ?BP (!) 152/72   Pulse (!) 49   Ht '5\' 7"'$  (1.702 m)   Wt 154 lb (69.9 kg)   SpO2 96%   BMI 24.12 kg/m?  ? ?Physical Exam: ? ?Well appearing elderly man, NAD ?HEENT: Unremarkable ?Neck:  No JVD, no thyromegally ?Lymphatics:  No adenopathy ?Back:  No CVA tenderness ?Lungs:  Clear with no wheezes ?HEART:  Regular rate rhythm, no murmurs, no rubs, no clicks ?Abd:  soft, positive bowel sounds, no organomegally, no rebound, no guarding ?Ext:  2 plus pulses, no edema, no cyanosis, no clubbing ?Skin:  No rashes no nodules ?Neuro:  CN II through XII intact, motor grossly intact ? ?EKG - reviewed. NSR ? ? ?Assess/Plan:  ?Heart block - he remains asymptomatic and is  very active. I have recommended watchful waiting. ?HTN -his bp is well controlled at home although tends to run high in the mD's  office. ?Carleene Overlie Anishka Bushard,MD ?

## 2022-02-08 NOTE — Patient Instructions (Addendum)
Medication Instructions:  ?Your physician recommends that you continue on your current medications as directed. Please refer to the Current Medication list given to you today. ? ?*If you need a refill on your cardiac medications before your next appointment, please call your pharmacy* ? ? ?Lab Work: ?TODAY: BMP ?If you have labs (blood work) drawn today and your tests are completely normal, you will receive your results only by: ?MyChart Message (if you have MyChart) OR ?A paper copy in the mail ?If you have any lab test that is abnormal or we need to change your treatment, we will call you to review the results. ? ? ?Testing/Procedures: ?NONE ? ? ?Follow-Up: ?At [facility], you and your health needs are our priority.  As part of our continuing mission to provide you with exceptional heart care, we have created designated Provider Care Teams.  These Care Teams include your primary Cardiologist (physician) and Advanced Practice Providers (APPs -  Physician Assistants and Nurse Practitioners) who all work together to provide you with the care you need, when you need it. ? ?We recommend signing up for the patient portal called "MyChart".  Sign up information is provided on this After Visit Summary.  MyChart is used to connect with patients for Virtual Visits (Telemedicine).  Patients are able to view lab/test results, encounter notes, upcoming appointments, etc.  Non-urgent messages can be sent to your provider as well.   ?To learn more about what you can do with MyChart, go to [email or web address].   ? ?Your next appointment:   ?6  month(s) ? ?The format for your next appointment:   ?In Person ? ?Provider:   ?Sinclair Grooms, MD  ?  ? ? ?Other Instructions ? ? ?Important Information About Sugar ? ? ? ? ?  ?

## 2022-03-03 ENCOUNTER — Encounter: Payer: Self-pay | Admitting: Interventional Cardiology

## 2022-03-08 ENCOUNTER — Telehealth: Payer: Self-pay | Admitting: Interventional Cardiology

## 2022-03-08 DIAGNOSIS — I5022 Chronic systolic (congestive) heart failure: Secondary | ICD-10-CM

## 2022-03-08 DIAGNOSIS — I10 Essential (primary) hypertension: Secondary | ICD-10-CM

## 2022-03-08 DIAGNOSIS — I5032 Chronic diastolic (congestive) heart failure: Secondary | ICD-10-CM

## 2022-03-08 NOTE — Telephone Encounter (Signed)
Patient's daughter is returning call to discuss lab results. 

## 2022-03-08 NOTE — Telephone Encounter (Signed)
Spoke with patient and discussed lab results from 4 weeks ago.  Brandon Crome, MD  [name], [name] Let the patient know encourage daily water intake.  If not already ordered, basic metabolic panel in 4 to 6 weeks.   Patient verbalized understanding and scheduled repeat BMET for 03/22/22.

## 2022-03-22 ENCOUNTER — Other Ambulatory Visit: Payer: Medicare Other

## 2022-03-22 DIAGNOSIS — I129 Hypertensive chronic kidney disease with stage 1 through stage 4 chronic kidney disease, or unspecified chronic kidney disease: Secondary | ICD-10-CM | POA: Diagnosis not present

## 2022-03-22 DIAGNOSIS — E7800 Pure hypercholesterolemia, unspecified: Secondary | ICD-10-CM | POA: Diagnosis not present

## 2022-03-22 DIAGNOSIS — N1831 Chronic kidney disease, stage 3a: Secondary | ICD-10-CM | POA: Diagnosis not present

## 2022-04-01 DIAGNOSIS — I447 Left bundle-branch block, unspecified: Secondary | ICD-10-CM | POA: Diagnosis not present

## 2022-04-01 DIAGNOSIS — I5020 Unspecified systolic (congestive) heart failure: Secondary | ICD-10-CM | POA: Diagnosis not present

## 2022-04-01 DIAGNOSIS — I129 Hypertensive chronic kidney disease with stage 1 through stage 4 chronic kidney disease, or unspecified chronic kidney disease: Secondary | ICD-10-CM | POA: Diagnosis not present

## 2022-04-01 DIAGNOSIS — I2510 Atherosclerotic heart disease of native coronary artery without angina pectoris: Secondary | ICD-10-CM | POA: Diagnosis not present

## 2022-04-13 DIAGNOSIS — Z85828 Personal history of other malignant neoplasm of skin: Secondary | ICD-10-CM | POA: Diagnosis not present

## 2022-04-13 DIAGNOSIS — L280 Lichen simplex chronicus: Secondary | ICD-10-CM | POA: Diagnosis not present

## 2022-04-13 DIAGNOSIS — L905 Scar conditions and fibrosis of skin: Secondary | ICD-10-CM | POA: Diagnosis not present

## 2022-04-13 DIAGNOSIS — L570 Actinic keratosis: Secondary | ICD-10-CM | POA: Diagnosis not present

## 2022-04-13 DIAGNOSIS — D485 Neoplasm of uncertain behavior of skin: Secondary | ICD-10-CM | POA: Diagnosis not present

## 2022-04-13 DIAGNOSIS — L814 Other melanin hyperpigmentation: Secondary | ICD-10-CM | POA: Diagnosis not present

## 2022-06-24 ENCOUNTER — Encounter (HOSPITAL_COMMUNITY): Payer: Self-pay

## 2022-06-24 DIAGNOSIS — I2510 Atherosclerotic heart disease of native coronary artery without angina pectoris: Secondary | ICD-10-CM | POA: Diagnosis not present

## 2022-06-24 DIAGNOSIS — I4901 Ventricular fibrillation: Secondary | ICD-10-CM | POA: Diagnosis not present

## 2022-06-24 DIAGNOSIS — I5020 Unspecified systolic (congestive) heart failure: Secondary | ICD-10-CM | POA: Diagnosis not present

## 2022-06-24 DIAGNOSIS — N179 Acute kidney failure, unspecified: Secondary | ICD-10-CM | POA: Diagnosis not present

## 2022-06-24 DIAGNOSIS — Z87891 Personal history of nicotine dependence: Secondary | ICD-10-CM | POA: Diagnosis not present

## 2022-06-24 DIAGNOSIS — I130 Hypertensive heart and chronic kidney disease with heart failure and stage 1 through stage 4 chronic kidney disease, or unspecified chronic kidney disease: Secondary | ICD-10-CM | POA: Diagnosis not present

## 2022-06-24 DIAGNOSIS — Z7982 Long term (current) use of aspirin: Secondary | ICD-10-CM | POA: Diagnosis not present

## 2022-06-24 DIAGNOSIS — I4891 Unspecified atrial fibrillation: Secondary | ICD-10-CM | POA: Diagnosis not present

## 2022-06-24 DIAGNOSIS — R0600 Dyspnea, unspecified: Secondary | ICD-10-CM | POA: Diagnosis not present

## 2022-06-24 DIAGNOSIS — R0602 Shortness of breath: Secondary | ICD-10-CM | POA: Diagnosis not present

## 2022-06-24 DIAGNOSIS — I471 Supraventricular tachycardia: Secondary | ICD-10-CM | POA: Diagnosis not present

## 2022-06-24 DIAGNOSIS — I4439 Other atrioventricular block: Secondary | ICD-10-CM | POA: Diagnosis not present

## 2022-06-24 DIAGNOSIS — E8770 Fluid overload, unspecified: Secondary | ICD-10-CM | POA: Diagnosis not present

## 2022-06-24 DIAGNOSIS — I5A Non-ischemic myocardial injury (non-traumatic): Secondary | ICD-10-CM | POA: Diagnosis not present

## 2022-06-24 DIAGNOSIS — I272 Pulmonary hypertension, unspecified: Secondary | ICD-10-CM | POA: Diagnosis not present

## 2022-06-24 DIAGNOSIS — J1282 Pneumonia due to coronavirus disease 2019: Secondary | ICD-10-CM | POA: Diagnosis not present

## 2022-06-24 DIAGNOSIS — I441 Atrioventricular block, second degree: Secondary | ICD-10-CM | POA: Diagnosis not present

## 2022-06-24 DIAGNOSIS — I472 Ventricular tachycardia, unspecified: Secondary | ICD-10-CM | POA: Diagnosis not present

## 2022-06-24 DIAGNOSIS — I5042 Chronic combined systolic (congestive) and diastolic (congestive) heart failure: Secondary | ICD-10-CM | POA: Diagnosis not present

## 2022-06-24 DIAGNOSIS — Z20822 Contact with and (suspected) exposure to covid-19: Secondary | ICD-10-CM | POA: Diagnosis not present

## 2022-06-24 DIAGNOSIS — R079 Chest pain, unspecified: Secondary | ICD-10-CM | POA: Diagnosis not present

## 2022-06-24 DIAGNOSIS — N1831 Chronic kidney disease, stage 3a: Secondary | ICD-10-CM | POA: Diagnosis not present

## 2022-06-24 DIAGNOSIS — I110 Hypertensive heart disease with heart failure: Secondary | ICD-10-CM | POA: Diagnosis not present

## 2022-06-24 DIAGNOSIS — I5032 Chronic diastolic (congestive) heart failure: Secondary | ICD-10-CM | POA: Diagnosis not present

## 2022-06-24 DIAGNOSIS — R918 Other nonspecific abnormal finding of lung field: Secondary | ICD-10-CM | POA: Diagnosis not present

## 2022-06-24 DIAGNOSIS — Z955 Presence of coronary angioplasty implant and graft: Secondary | ICD-10-CM | POA: Diagnosis not present

## 2022-06-24 DIAGNOSIS — R778 Other specified abnormalities of plasma proteins: Secondary | ICD-10-CM | POA: Diagnosis not present

## 2022-06-24 DIAGNOSIS — I214 Non-ST elevation (NSTEMI) myocardial infarction: Secondary | ICD-10-CM | POA: Diagnosis not present

## 2022-06-24 DIAGNOSIS — I4892 Unspecified atrial flutter: Secondary | ICD-10-CM | POA: Diagnosis not present

## 2022-06-24 DIAGNOSIS — Z950 Presence of cardiac pacemaker: Secondary | ICD-10-CM | POA: Diagnosis not present

## 2022-06-24 DIAGNOSIS — I252 Old myocardial infarction: Secondary | ICD-10-CM | POA: Diagnosis not present

## 2022-06-24 DIAGNOSIS — I442 Atrioventricular block, complete: Secondary | ICD-10-CM | POA: Diagnosis not present

## 2022-06-24 DIAGNOSIS — I10 Essential (primary) hypertension: Secondary | ICD-10-CM | POA: Diagnosis not present

## 2022-06-24 DIAGNOSIS — I447 Left bundle-branch block, unspecified: Secondary | ICD-10-CM | POA: Diagnosis not present

## 2022-06-24 DIAGNOSIS — Z79899 Other long term (current) drug therapy: Secondary | ICD-10-CM | POA: Diagnosis not present

## 2022-06-24 DIAGNOSIS — U071 COVID-19: Secondary | ICD-10-CM | POA: Diagnosis not present

## 2022-06-27 ENCOUNTER — Telehealth: Payer: Self-pay | Admitting: Interventional Cardiology

## 2022-06-27 NOTE — Telephone Encounter (Signed)
Dr. Loma Sender would like to confer with Dr. Tamala Julian regarding this patient.

## 2022-06-28 NOTE — Telephone Encounter (Signed)
Call was transferred to Dr. Tamala Julian on 06/27/22, he spoke with Dr. Loma Sender. No new orders.

## 2022-07-06 DIAGNOSIS — Z45018 Encounter for adjustment and management of other part of cardiac pacemaker: Secondary | ICD-10-CM | POA: Diagnosis not present

## 2022-07-08 DIAGNOSIS — I5020 Unspecified systolic (congestive) heart failure: Secondary | ICD-10-CM | POA: Diagnosis not present

## 2022-07-08 DIAGNOSIS — Z8616 Personal history of COVID-19: Secondary | ICD-10-CM | POA: Diagnosis not present

## 2022-07-08 DIAGNOSIS — Z950 Presence of cardiac pacemaker: Secondary | ICD-10-CM | POA: Diagnosis not present

## 2022-07-30 NOTE — Progress Notes (Deleted)
Cardiology Office Note:    Date:  07/30/2022   ID:  Brandon Robles, DOB Mar 02, 1926, MRN [id number]  PCP:  Lavone Orn, MD  Cardiologist:  Sinclair Grooms, MD   Referring MD: Lavone Orn, MD   No chief complaint on file.   History of Present Illness:    Brandon Robles is a 88 y.o. male with a hx of coronary artery disease with prior non-drug-eluting stent implantation during acute inferior in 2001and LAD bare-metal stent 2007, hypertension, hyperlipidemia, sinus node dysfunction, complete heart block, accelerated junctional rhythm and history of pericarditis. Though has bradycardia, he is asymptomatic and not needed pacer (Followed by Dr. Cristopher Peru EP).  ***  Past Medical History:  Diagnosis Date   Blastocystis hominis    Bradycardia 07/18/2013   CAD (coronary artery disease)    S/P inferior MI 07/2000 with RCA stenting at that time. In 03/2006 he had a LAD non-drug-eluting stent implanted.   Chronic insomnia    Complex renal cyst    Left   Diverticulitis    ED (erectile dysfunction)    Vacuum pump   Essential hypertension, benign    Stable, on lisinopril-HCTZ & Atenolol.   GERD (gastroesophageal reflux disease)    H/O pericarditis    Hemorrhoids    LBBB (left bundle branch block)    Multiple facial fractures ([place]) 02/25/2016   Multiple rib fractures 09/25/10   PTX 09/2010   Nephrolithiasis    Renal stone basketing.   Pure hypercholesterolemia    On Red Yeast Rice & CoQ 10.   Sinoatrial node dysfunction (HCC)    EC Holter 24hr 06/24/13 showed sinus bradycardia with probable chronotropic incompetence. May need pacemaker per Dr. Daneen Schick.   Skin cancer    Left base (Face) & (Ear in 12/2011).    Past Surgical History:  Procedure Laterality Date   CORONARY ANGIOPLASTY WITH STENT PLACEMENT  07/25/00   MI in 07/25/00 with RCA stenting at that time.   CORONARY ANGIOPLASTY WITH STENT PLACEMENT  04/14/06   Bare metal stent in proximal LAD [facility] [employer] - 3.5  x16 stent) by Dr. Linard Millers.   PROSTATE BIOPSY     Evidence   RIGHT THORACOTOMY,OPEN REDUCTION AND INTERNAL FIXATION OF RIBS 4-7  09/25/2010   STONE EXTRACTION WITH BASKET     Renal    Current Medications: No outpatient medications have been marked as taking for the 08/01/22 encounter (Appointment) with Brandon Crome, MD.     Allergies:   Vitamin d analogs, Fish oil, Ambien [zolpidem tartrate], and Honey   Social History   Socioeconomic History   Marital status: Single    Spouse name: Not on file   Number of children: Not on file   Years of education: Not on file   Highest education level: Not on file  Occupational History   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: RETIRED  Tobacco Use   Smoking status: Former    Types: Cigars   Smokeless tobacco: Never   Tobacco comments:    quit 1990  Vaping Use   Vaping Use: Never used  Substance and Sexual Activity   Alcohol use: Yes    Comment: NOT ON A REGULAR BASIS   Drug use: No   Sexual activity: Not on file  Other Topics Concern   Not on file  Social History Narrative   Not on file   Social Determinants of Health   Financial Resource Strain: Not on file  Food Insecurity:  Not on file  Transportation Needs: Not on file  Physical Activity: Not on file  Stress: Not on file  Social Connections: Not on file     Family History: The patient's family history includes Aneurysm in his sister; CVA in his mother; Diabetes in his brother; Heart attack in his father; Heart disease in his brother and sister; Heart failure in his brother; Hypertension in his brother; Kidney failure in his father; Lung disease in his brother.  ROS:   Please see the history of present illness.    *** All other systems reviewed and are negative.  EKGs/Labs/Other Studies Reviewed:    The following studies were reviewed today: 2 D Doppler Echocardiogram 2022: IMPRESSIONS   1. Left ventricular ejection fraction, by estimation, is 30 to 35%. The  left  ventricle has moderately decreased function. The left ventricle  demonstrates regional wall motion abnormalities (see scoring  diagram/findings for description). Left ventricular   diastolic function could not be evaluated.   2. Right ventricular systolic function is normal. The right ventricular  size is mildly enlarged. There is severely elevated pulmonary artery  systolic pressure. The estimated right ventricular systolic pressure is  18.5 mmHg.   3. Left atrial size was mild to moderately dilated.   4. Right atrial size was mild to moderately dilated.   5. The mitral valve is normal in structure. Moderate mitral valve  regurgitation.   6. Tricuspid valve regurgitation is moderate to severe.   7. The aortic valve is tricuspid. There is mild calcification of the  aortic valve. There is mild thickening of the aortic valve. Aortic valve  regurgitation is mild. Mild aortic valve sclerosis is present, with no  evidence of aortic valve stenosis.   8. The inferior vena cava is normal in size with <50% respiratory  variability, suggesting right atrial pressure of 8 mmHg.   Comparison(s): No prior Echocardiogram.   Conclusion(s)/Recommendation(s): LVEF reduced, with focal wall motion  abnormalities as noted. Slow echo contrast movement at the LV apex, but no  thrombus seen. Severely elevated right sided filling pressures.   EKG:  EKG ***  Recent Labs: 08/17/2021: B Natriuretic Peptide 719.2; Hemoglobin 11.8; Platelets 185 02/08/2022: BUN 34; Creatinine, Ser 1.72; Potassium 4.4; Sodium 142  Recent Lipid Panel    Component Value Date/Time   CHOL 140 06/30/2014 1025   TRIG 48.0 06/30/2014 1025   HDL 45.80 06/30/2014 1025   CHOLHDL 3 06/30/2014 1025   VLDL 9.6 06/30/2014 1025   LDLCALC 85 06/30/2014 1025    Physical Exam:    VS:  There were no vitals taken for this visit.    Wt Readings from Last 3 Encounters:  02/08/22 154 lb (69.9 kg)  02/08/22 154 lb 3.2 oz (69.9 kg)  10/20/21  160 lb 6.4 oz (72.8 kg)     GEN: ***. No acute distress HEENT: Normal NECK: No JVD. LYMPHATICS: No lymphadenopathy CARDIAC: *** murmur. RRR *** gallop, or edema. VASCULAR: *** Normal Pulses. No bruits. RESPIRATORY:  Clear to auscultation without rales, wheezing or rhonchi  ABDOMEN: Soft, non-tender, non-distended, No pulsatile mass, MUSCULOSKELETAL: No deformity  SKIN: Warm and dry NEUROLOGIC:  Alert and oriented x 3 PSYCHIATRIC:  Normal affect   ASSESSMENT:    1. Chronic diastolic HF (heart failure) ([place])   2. Essential hypertension   3. Complete heart block, transient ([place])   4. Primary hypertension   5. Coronary artery disease involving native coronary artery of native heart without angina pectoris    PLAN:  In order of problems listed above:  ***   Medication Adjustments/Labs and Tests Ordered: Current medicines are reviewed at length with the patient today.  Concerns regarding medicines are outlined above.  No orders of the defined types were placed in this encounter.  No orders of the defined types were placed in this encounter.   There are no Patient Instructions on file for this visit.   Signed, Sinclair Grooms, MD  07/30/2022 3:39 PM    [place]

## 2022-08-01 ENCOUNTER — Telehealth: Payer: Self-pay | Admitting: Interventional Cardiology

## 2022-08-01 ENCOUNTER — Ambulatory Visit: Payer: Medicare Other | Admitting: Interventional Cardiology

## 2022-08-01 DIAGNOSIS — I5032 Chronic diastolic (congestive) heart failure: Secondary | ICD-10-CM

## 2022-08-01 DIAGNOSIS — I10 Essential (primary) hypertension: Secondary | ICD-10-CM

## 2022-08-01 DIAGNOSIS — I442 Atrioventricular block, complete: Secondary | ICD-10-CM

## 2022-08-01 DIAGNOSIS — I2510 Atherosclerotic heart disease of native coronary artery without angina pectoris: Secondary | ICD-10-CM

## 2022-08-01 NOTE — Telephone Encounter (Signed)
Patient called to talk with Dr. Tamala Julian or nurse. Please call back

## 2022-08-01 NOTE — Telephone Encounter (Signed)
Returned call to patient.  Patient states he had a pacemaker implanted at [facility] last month. He reports his pulse oximeter was registering his HR between 85-90 bpm. Patient states he palpated his pulse radially and counted 60 bpm. He reports his HR is now in the 50's at time of call.  Patient denies any dizziness, SOB, CP, or fatigue. 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 Lovena Le on 09/20/22.

## 2022-08-02 DIAGNOSIS — Z45018 Encounter for adjustment and management of other part of cardiac pacemaker: Secondary | ICD-10-CM | POA: Diagnosis not present

## 2022-08-18 NOTE — Telephone Encounter (Signed)
Pt called to follow up, and see if he has become symptomatic.  Pt stated he feels great since having his pacemaker implanted.    Pt made aware of the 12/5 appointment with Dr. Lovena Le, but offered an earlier appointment if he needed it.  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  Pt advised to let us know if he needs [place], to call [facility].  Pt understood.

## 2022-09-13 ENCOUNTER — Other Ambulatory Visit: Payer: Self-pay

## 2022-09-13 MED ORDER — ENTRESTO 24-26 MG PO TABS: 1.0000 | ORAL_TABLET | Freq: Two times a day (BID) | ORAL | 5 refills | Status: DC

## 2022-09-19 ENCOUNTER — Encounter: Payer: Medicare Other | Admitting: Internal Medicine

## 2022-09-20 ENCOUNTER — Encounter: Payer: Medicare Other | Admitting: Internal Medicine

## 2022-09-20 ENCOUNTER — Telehealth: Payer: Self-pay | Admitting: *Deleted

## 2022-09-20 MED ORDER — EMPAGLIFLOZIN 10 MG PO TABS: 10.0000 mg | ORAL_TABLET | Freq: Every day | ORAL | 3 refills | Status: DC

## 2022-09-20 NOTE — Telephone Encounter (Signed)
Rx refill sent to pharmacy. 

## 2022-09-26 DIAGNOSIS — N1831 Chronic kidney disease, stage 3a: Secondary | ICD-10-CM | POA: Diagnosis not present

## 2022-09-26 DIAGNOSIS — Z950 Presence of cardiac pacemaker: Secondary | ICD-10-CM | POA: Diagnosis not present

## 2022-09-26 DIAGNOSIS — I5020 Unspecified systolic (congestive) heart failure: Secondary | ICD-10-CM | POA: Diagnosis not present

## 2022-09-26 DIAGNOSIS — I252 Old myocardial infarction: Secondary | ICD-10-CM | POA: Diagnosis not present

## 2022-09-27 ENCOUNTER — Other Ambulatory Visit: Payer: Self-pay

## 2022-09-27 MED ORDER — FUROSEMIDE 20 MG PO TABS: 20.0000 mg | ORAL_TABLET | Freq: Every day | ORAL | 1 refills | Status: DC

## 2022-09-29 ENCOUNTER — Ambulatory Visit: Payer: Medicare Other | Attending: Internal Medicine | Admitting: Internal Medicine

## 2022-09-29 ENCOUNTER — Encounter: Payer: Self-pay | Admitting: Internal Medicine

## 2022-09-29 VITALS — BP 140/58 | HR 60 | Ht 67.0 in | Wt 150.0 lb

## 2022-09-29 DIAGNOSIS — I10 Essential (primary) hypertension: Secondary | ICD-10-CM | POA: Diagnosis not present

## 2022-09-29 DIAGNOSIS — I5033 Acute on chronic diastolic (congestive) heart failure: Secondary | ICD-10-CM

## 2022-09-29 DIAGNOSIS — I25119 Atherosclerotic heart disease of native coronary artery with unspecified angina pectoris: Secondary | ICD-10-CM

## 2022-09-29 DIAGNOSIS — R001 Bradycardia, unspecified: Secondary | ICD-10-CM

## 2022-09-29 DIAGNOSIS — I442 Atrioventricular block, complete: Secondary | ICD-10-CM

## 2022-09-29 NOTE — Progress Notes (Signed)
HPI Brandon Robles returns today for followup of his bradycardia. He is a pleasant elderly man with a h/o HTN, high grade heart block, and sinus bradycardia. He remains active. He has not had syncope. 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 He denies anginal symptoms or sob. No syncope. In September he got Covid and when there was no bed available at [facility] he was transferred to [facility] and almost immediately underwent PPM insertion. He feels better. He has returned to his active lifestyle. He denies chest pain or sob.  Allergies  Allergen Reactions   Other Other (See Comments)    "pins and needles feeling"   Vitamin D Analogs Other (See Comments)    "pins and needles feeling"   Zolpidem Other (See Comments)    Confusion, hallucinations   Fish Oil Other (See Comments)   Ambien [Zolpidem Tartrate] Other (See Comments)    Confusion   Honey Other (See Comments)    "pins and needles feeling"     Current Outpatient Medications  Medication Sig Dispense Refill   acetaminophen (TYLENOL) 325 MG tablet Take 2 tablets (650 mg total) by mouth every 4 (four) hours as needed for mild pain.     amoxicillin (AMOXIL) 500 MG capsule Take 4 capsules by mouth as needed 1 hour prior to dental procedures.  0   aspirin EC 81 MG tablet Take 81 mg by mouth daily.     co-enzyme Q-10 30 MG capsule Take 30 mg by mouth daily.     empagliflozin (JARDIANCE) 10 MG TABS tablet Take 1 tablet (10 mg total) by mouth daily before breakfast. 30 tablet 3   furosemide (LASIX) 20 MG tablet Take 1 tablet (20 mg total) by mouth daily. 90 tablet 1   metoprolol succinate (TOPROL-XL) 50 MG 24 hr tablet Take 0.5 tablets by mouth daily.     nitroGLYCERIN (NITROSTAT) 0.4 MG SL tablet Place 0.4 mg under the tongue every 5 (five) minutes as needed for chest pain. (MAXIMUM of 3 TABLETS)     OVER THE COUNTER MEDICATION Take 2 capsules by mouth daily. OTC [place] capsules     OVER THE COUNTER MEDICATION Take 1  capsule by mouth daily. OMEGA Q PLUS     phenylephrine (SUDAFED PE) 10 MG TABS tablet Take 10 mg by mouth every 6 (six) hours as needed (nasal congestion).     Red Yeast Rice 600 MG CAPS Take 600 mg by mouth daily.     sacubitril-valsartan (ENTRESTO) 24-26 MG Take 1 tablet by mouth 2 (two) times daily. 60 tablet 5   sodium chloride (OCEAN) 0.65 % SOLN nasal spray Place 1 spray into both nostrils daily as needed for congestion.     spironolactone (ALDACTONE) 25 MG tablet Take 12.5 mg by mouth daily.     temazepam (RESTORIL) 30 MG capsule Take 30 mg by mouth at bedtime.     Throat Lozenges [facility] FRIEND MT) Use as directed 1 lozenge in the mouth or throat 2 (two) times daily as needed (congestion).     No current facility-administered medications for this visit.     Past Medical History:  Diagnosis Date   Blastocystis hominis    Bradycardia 07/18/2013   CAD (coronary artery disease)    S/P inferior MI 07/2000 with RCA stenting at that time. In 03/2006 he had a LAD non-drug-eluting stent implanted.   Chronic insomnia    Complex renal cyst    Left  Diverticulitis    ED (erectile dysfunction)    Vacuum pump   Essential hypertension, benign    Stable, on lisinopril-HCTZ & Atenolol.   GERD (gastroesophageal reflux disease)    H/O pericarditis    Hemorrhoids    LBBB (left bundle branch block)    Multiple facial fractures ([place]) 02/25/2016   Multiple rib fractures 09/25/10   PTX 09/2010   Nephrolithiasis    Renal stone basketing.   Pure hypercholesterolemia    On Red Yeast Rice & CoQ 10.   Sinoatrial node dysfunction (HCC)    EC Holter 24hr 06/24/13 showed sinus bradycardia with probable chronotropic incompetence. May need pacemaker per Dr. Daneen Schick.   Skin cancer    Left base (Face) & (Ear in 12/2011).    ROS:   All systems reviewed and negative except as noted in the HPI.   Past Surgical History:  Procedure Laterality Date   CORONARY ANGIOPLASTY WITH STENT PLACEMENT   07/25/00   MI in 07/25/00 with RCA stenting at that time.   CORONARY ANGIOPLASTY WITH STENT PLACEMENT  04/14/06   Bare metal stent in proximal LAD [facility] - 3.5 x16 stent) by Dr. Linard Millers.   PROSTATE BIOPSY     Evidence   RIGHT THORACOTOMY,OPEN REDUCTION AND INTERNAL FIXATION OF RIBS 4-7  09/25/2010   STONE EXTRACTION WITH BASKET     Renal     Family History  Problem Relation Age of Onset   CVA Mother    Heart attack Father    Kidney failure Father    Heart disease Sister        Possible heart disease   Aneurysm Sister    Hypertension Brother    Heart disease Brother    Heart failure Brother    Lung disease Brother    Diabetes Brother      Social History   Socioeconomic History   Marital status: Single    Spouse name: Not on file   Number of children: Not on file   Years of education: Not on file   Highest education level: Not on file  Occupational History   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: RETIRED  Tobacco Use   Smoking status: Former    Types: Cigars   Smokeless tobacco: Never   Tobacco comments:    quit 1990  Vaping Use   Vaping Use: Never used  Substance and Sexual Activity   Alcohol use: Yes    Comment: NOT ON A REGULAR BASIS   Drug use: No   Sexual activity: Not on file  Other Topics Concern   Not on file  Social History Narrative   Not on file   Social Determinants of Health   Financial Resource Strain: Not on file  Food Insecurity: Not on file  Transportation Needs: Not on file  Physical Activity: Not on file  Stress: Not on file  Social Connections: Not on file  Intimate Partner Violence: Not on file     BP (!) 140/58   Pulse 60   Ht '5\' 7"'$  (1.702 m)   Wt 150 lb (68 kg)   SpO2 99%   BMI 23.49 kg/m   Physical Exam:  Well appearing elderly man, NAD HEENT: Unremarkable Neck:  No JVD, no thyromegally Lymphatics:  No adenopathy Back:  No CVA tenderness Lungs:  Clear with no wheezes HEART:  Regular rate rhythm, no  murmurs, no rubs, no clicks Abd:  soft, positive bowel sounds, no organomegally, no rebound, no guarding  Ext:  2 plus pulses, no edema, no cyanosis, no clubbing Skin:  No rashes no nodules Neuro:  CN II through XII intact, motor grossly intact  EKG - nsr with P synchronous ventricular pacing  DEVICE  Normal device function.  See PaceArt for details.   Assess/Plan:  Heart block - he remains asymptomatic and is now s/p PPM insertion. HTN -his bp is well controlled at home although tends to run high in the mD's office. PPM - his [employer] DDD PM is working normally. We will recheck in a year.  Carleene Overlie Shereda Graw,MD

## 2022-09-29 NOTE — Patient Instructions (Signed)
Medication Instructions:  NO CHANGES  *If you need a refill on your cardiac medications before your next appointment, please call your pharmacy*   Lab Work: NONE If you have labs (blood work) drawn today and your tests are completely normal, you will receive your results only by: [place] (if you have MyChart) OR A paper copy in the mail If you have any lab test that is abnormal or we need to change your treatment, we will call you to review the results.   Testing/Procedures: NONE   Follow-Up: At [facility], you and your health needs are our priority.  As part of our continuing mission to provide you with exceptional heart care, we have created designated Provider Care Teams.  These Care Teams include your primary Cardiologist (physician) and Advanced Practice Providers (APPs -  Physician Assistants and Nurse Practitioners) who all work together to provide you with the care you need, when you need it.  We recommend signing up for the patient portal called "MyChart".  Sign up information is provided on this After Visit Summary.  MyChart is used to connect with patients for Virtual Visits (Telemedicine).  Patients are able to view lab/test results, encounter notes, upcoming appointments, etc.  Non-urgent messages can be sent to your provider as well.   To learn more about what you can do with MyChart, go to [email or web address].    Your next appointment:   1 year(s)  The format for your next appointment:   In Person  Provider:  DR Lovena Le  Other Instructions NONE  Important Information About [place]

## 2022-10-04 ENCOUNTER — Ambulatory Visit: Payer: Medicare Other | Admitting: Internal Medicine

## 2022-10-11 ENCOUNTER — Ambulatory Visit (INDEPENDENT_AMBULATORY_CARE_PROVIDER_SITE_OTHER): Payer: Medicare Other

## 2022-10-11 DIAGNOSIS — I442 Atrioventricular block, complete: Secondary | ICD-10-CM

## 2022-10-11 LAB — CUP PACEART REMOTE DEVICE CHECK
Battery Remaining Longevity: 90 mo
Battery Remaining Percentage: 100 %
Brady Statistic RA Percent Paced: 22 %
Brady Statistic RV Percent Paced: 99 %
Date Time Interrogation Session: 20231223012100
Implantable Lead Connection Status: 753985
Implantable Lead Connection Status: 753985
Implantable Lead Implant Date: 20230911
Implantable Lead Implant Date: 20230911
Implantable Lead Location: 753859
Implantable Lead Location: 753860
Implantable Lead Model: 7840
Implantable Lead Model: 7841
Implantable Lead Serial Number: 104690
Implantable Lead Serial Number: 1094065
Implantable Pulse Generator Implant Date: 20230911
Lead Channel Impedance Value: 631 Ohm
Lead Channel Impedance Value: 676 Ohm
Lead Channel Setting Pacing Amplitude: 2.5 V
Lead Channel Setting Pacing Amplitude: 2.5 V
Lead Channel Setting Pacing Pulse Width: 0.4 ms
Lead Channel Setting Sensing Sensitivity: 2.5 mV
Pulse Gen Serial Number: 689048
Zone Setting Status: 755011

## 2022-11-01 DIAGNOSIS — Z23 Encounter for immunization: Secondary | ICD-10-CM | POA: Diagnosis not present

## 2022-11-07 NOTE — Progress Notes (Signed)
Remote pacemaker transmission.   

## 2022-11-28 ENCOUNTER — Telehealth: Payer: Self-pay

## 2022-11-28 NOTE — Telephone Encounter (Signed)
Alert received from [employer]:  Device alert Atrial Arrhythmia Burden of at least 6.0 hours in a 24 hour period. EGM's show AT/AFL, atrial rates 127-161, in progress from 2/10 @ 15:17, ASA only - route to triage for review Presenting AFL/VP  Will message Dr. Lovena Le to see if he wishes to consider a blood thinner at this time.

## 2022-11-29 ENCOUNTER — Telehealth: Payer: Self-pay | Admitting: *Deleted

## 2022-11-29 NOTE — Telephone Encounter (Signed)
Discussed with Dr. Lovena Le.  Will schedule phone visit with Pt to discuss [place].

## 2022-11-29 NOTE — Telephone Encounter (Signed)
Called pt at home X3 with no answer. Called pt son and given a cell number to try [phone]. Called cell number with no answer, no voice mail.

## 2022-11-29 NOTE — Telephone Encounter (Signed)
-----   Message from Damian Leavell, RN sent at 11/29/2022 10:04 AM EST ----- Needs telephone visit to discuss [facility]

## 2022-11-30 NOTE — Telephone Encounter (Signed)
Patient states he needs a call back around 10 o'clock this morning, because he will be out at breakfast.

## 2022-12-01 ENCOUNTER — Encounter: Payer: Self-pay | Admitting: Internal Medicine

## 2022-12-01 ENCOUNTER — Ambulatory Visit: Payer: Medicare Other | Attending: Internal Medicine | Admitting: Internal Medicine

## 2022-12-01 VITALS — BP 116/66 | HR 50 | Ht 67.0 in | Wt 154.6 lb

## 2022-12-01 DIAGNOSIS — I4892 Unspecified atrial flutter: Secondary | ICD-10-CM

## 2022-12-01 MED ORDER — APIXABAN 5 MG PO TABS: 5.0000 mg | ORAL_TABLET | Freq: Two times a day (BID) | ORAL | 6 refills | Status: DC

## 2022-12-01 NOTE — Patient Instructions (Addendum)
Medication Instructions:  Your physician has recommended you make the following change in your medication:  START Eliquis 5 mg twice daily STOP Aspirin  *If you need a refill on your cardiac medications before your next appointment, please call your pharmacy*   Lab Work: None ordered  Testing/Procedures: None ordered   Follow-Up: At [facility], you and your health needs are our priority.  As part of our continuing mission to provide you with exceptional heart care, we have created designated Provider Care Teams.  These Care Teams include your primary Cardiologist (physician) and Advanced Practice Providers (APPs -  Physician Assistants and Nurse Practitioners) who all work together to provide you with the care you need, when you need it.  We recommend signing up for the patient portal called "MyChart".  Sign up information is provided on this After Visit Summary.  MyChart is used to connect with patients for Virtual Visits (Telemedicine).  Patients are able to view lab/test results, encounter notes, upcoming appointments, etc.  Non-urgent messages can be sent to your provider as well.   To learn more about what you can do with MyChart, go to [email or web address].    Your next appointment:   10 month(s)  The format for your next appointment:   In Person  Provider:   Cristopher Peru, MD    Thank you for choosing [facility]!!   [phone]  Other Instructions  Apixaban Tablets What is this medication? APIXABAN (a PIX a ban) prevents and treats blood clots. It is also used to lower the risk of stroke in people with AFib (atrial fibrillation). It belongs to a group of medications called blood thinners. This medicine may be used for other purposes; ask your health care provider or pharmacist if you have questions. COMMON BRAND NAME(S): Eliquis What should I tell my care team before I take this medication? They need to know if you have any of these  conditions: Antiphospholipid antibody syndrome Bleeding disorder History of bleeding in the brain History of blood clots History of stomach bleeding Kidney disease Liver disease Mechanical heart valve Spinal surgery An unusual or allergic reaction to apixaban, other medications, foods, dyes, or preservatives Pregnant or trying to get pregnant Breastfeeding How should I use this medication? Take this medication by mouth. For your therapy to work as well as possible, take each dose exactly as prescribed on the prescription label. Do not skip doses. Skipping doses or stopping this medication can increase your risk of a blood clot or stroke. Keep taking this medication unless your care team tells you to stop. Take it as directed on the prescription label at the same time every day. You can take it with or without food. If it upsets your stomach, take it with food. A special MedGuide will be given to you by the pharmacist with each prescription and refill. Be sure to read this information carefully each time. Talk to your care team about the use of this medication in children. Special care may be needed. Overdosage: If you think you have taken too much of this medicine contact a poison control center or emergency room at once. NOTE: This medicine is only for you. Do not share this medicine with others. What if I miss a dose? If you miss a dose, take it as soon as you can. If it is almost time for your next dose, take only that dose. Do not take double or extra doses. What may interact with this medication? This medication may interact with  the following: Aspirin and aspirin-like medications Certain medications for fungal infections like itraconazole and ketoconazole Certain medications for seizures like carbamazepine and phenytoin Certain medications for blood clots like enoxaparin, dalteparin, heparin, and warfarin Clarithromycin NSAIDs, medications for pain and inflammation, like ibuprofen  or naproxen Rifampin Ritonavir St. John's wort This list may not describe all possible interactions. Give your health care provider a list of all the medicines, herbs, non-prescription drugs, or dietary supplements you use. Also tell them if you smoke, drink alcohol, or use illegal drugs. Some items may interact with your medicine. What should I watch for while using this medication? Visit your care team for regular checks on your progress. Your condition will be monitored carefully while you are receiving this medication. You may need blood work while taking this medication. Avoid sports and activities that might cause injury while you are using this medication. Severe falls or injuries can cause unseen bleeding. Be careful when using sharp tools or knives. Consider using an Copy. Take special care brushing or flossing your teeth. Report any injuries, bruising, or red spots on the skin to your care team. If you are going to need surgery or other procedure, tell your care team that you are taking this medication. Wear a medical ID bracelet or chain. Carry a card that describes your condition. List the medications and doses you take on the card. What side effects may I notice from receiving this medication? Side effects that you should report to your care team as soon as possible: Allergic reactions--skin rash, itching, hives, swelling of the face, lips, tongue, or throat Bleeding--bloody or black, tar-like stools, vomiting blood or brown material that looks like coffee grounds, red or dark brown urine, small red or purple spots on the skin, unusual bruising or bleeding Bleeding in the brain--severe headache, stiff neck, confusion, dizziness, change in vision, numbness or weakness of the face, arm, or leg, trouble speaking, trouble walking, vomiting Heavy periods This list may not describe all possible side effects. Call your doctor for medical advice about side effects. You may report side  effects to FDA at 1-800-FDA-1088. Where should I keep my medication? Keep out of the reach of children and pets. Store at room temperature between 20 and 25 degrees C (68 and 77 degrees F). Get rid of any unused medication after the expiration date. To get rid of medications that are no longer needed or expired: Take the medication to a medication take-back program. Check with your pharmacy or law enforcement to find a location. If you cannot return the medication, check the label or package insert to see if the medication should be thrown out in the garbage or flushed down the toilet. If you are not sure, ask your care team. If it is safe to put in the trash, empty the medication out of the container. Mix the medication with cat litter, dirt, coffee grounds, or other unwanted substance. Seal the mixture in a bag or container. Put it in the trash. NOTE: This sheet is a summary. It may not cover all possible information. If you have questions about this medicine, talk to your doctor, pharmacist, or health care provider.  2023 Elsevier/Gold Standard (2020-10-30 00:00:00)

## 2022-12-01 NOTE — Telephone Encounter (Signed)
Additional alert received today:

## 2022-12-01 NOTE — Progress Notes (Signed)
HPI [name] returns today for followup of his bradycardia. He is a pleasant elderly man with a h/o HTN, high grade heart block, and sinus bradycardia. He remains active. He has not had syncope. 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 He denies anginal symptoms or sob. No syncope. He has developed worsening atrial fib/flutter.  Allergies  Allergen Reactions   Other Other (See Comments)    "pins and needles feeling"   Vitamin D Analogs Other (See Comments)    "pins and needles feeling"   Zolpidem Other (See Comments)    Confusion, hallucinations   Fish Oil Other (See Comments)   Ambien [Zolpidem Tartrate] Other (See Comments)    Confusion   Honey Other (See Comments)    "pins and needles feeling"     Current Outpatient Medications  Medication Sig Dispense Refill   acetaminophen (TYLENOL) 325 MG tablet Take 2 tablets (650 mg total) by mouth every 4 (four) hours as needed for mild pain.     amoxicillin (AMOXIL) 500 MG capsule Take 4 capsules by mouth as needed 1 hour prior to dental procedures.  0   aspirin EC 81 MG tablet Take 81 mg by mouth daily.     co-enzyme Q-10 30 MG capsule Take 30 mg by mouth daily.     empagliflozin (JARDIANCE) 10 MG TABS tablet Take 1 tablet (10 mg total) by mouth daily before breakfast. 30 tablet 3   furosemide (LASIX) 20 MG tablet Take 1 tablet (20 mg total) by mouth daily. 90 tablet 1   metoprolol succinate (TOPROL-XL) 50 MG 24 hr tablet Take 0.5 tablets by mouth daily.     nitroGLYCERIN (NITROSTAT) 0.4 MG SL tablet Place 0.4 mg under the tongue every 5 (five) minutes as needed for chest pain. (MAXIMUM of 3 TABLETS)     OVER THE COUNTER MEDICATION Take 2 capsules by mouth daily. OTC [place] capsules     OVER THE COUNTER MEDICATION Take 1 capsule by mouth daily. OMEGA Q PLUS     phenylephrine (SUDAFED PE) 10 MG TABS tablet Take 10 mg by mouth every 6 (six) hours as needed (nasal congestion).     Red Yeast Rice 600 MG CAPS  Take 600 mg by mouth daily.     sacubitril-valsartan (ENTRESTO) 24-26 MG Take 1 tablet by mouth 2 (two) times daily. 60 tablet 5   sodium chloride (OCEAN) 0.65 % SOLN nasal spray Place 1 spray into both nostrils daily as needed for congestion.     spironolactone (ALDACTONE) 25 MG tablet Take 12.5 mg by mouth daily.     temazepam (RESTORIL) 30 MG capsule Take 30 mg by mouth at bedtime.     Throat Lozenges [facility] FRIEND MT) Use as directed 1 lozenge in the mouth or throat 2 (two) times daily as needed (congestion).     No current facility-administered medications for this visit.     Past Medical History:  Diagnosis Date   Blastocystis hominis    Bradycardia 07/18/2013   CAD (coronary artery disease)    S/P inferior MI 07/2000 with RCA stenting at that time. In 03/2006 he had a LAD non-drug-eluting stent implanted.   Chronic insomnia    Complex renal cyst    Left   Diverticulitis    ED (erectile dysfunction)    Vacuum pump   Essential hypertension, benign    Stable, on lisinopril-HCTZ & Atenolol.   GERD (gastroesophageal reflux disease)  H/O pericarditis    Hemorrhoids    LBBB (left bundle branch block)    Multiple facial fractures ([place]) 02/25/2016   Multiple rib fractures 09/25/10   PTX 09/2010   Nephrolithiasis    Renal stone basketing.   Pure hypercholesterolemia    On Red Yeast Rice & CoQ 10.   Sinoatrial node dysfunction (HCC)    EC Holter 24hr 06/24/13 showed sinus bradycardia with probable chronotropic incompetence. May need pacemaker per Dr. Daneen Schick.   Skin cancer    Left base (Face) & (Ear in 12/2011).    ROS:   All systems reviewed and negative except as noted in the HPI.   Past Surgical History:  Procedure Laterality Date   CORONARY ANGIOPLASTY WITH STENT PLACEMENT  07/25/00   MI in 07/25/00 with RCA stenting at that time.   CORONARY ANGIOPLASTY WITH STENT PLACEMENT  04/14/06   Bare metal stent in proximal LAD [facility] - 3.5 x16 stent)  by Dr. Linard Millers.   PROSTATE BIOPSY     Evidence   RIGHT THORACOTOMY,OPEN REDUCTION AND INTERNAL FIXATION OF RIBS 4-7  09/25/2010   STONE EXTRACTION WITH BASKET     Renal     Family History  Problem Relation Age of Onset   CVA Mother    Heart attack Father    Kidney failure Father    Heart disease Sister        Possible heart disease   Aneurysm Sister    Hypertension Brother    Heart disease Brother    Heart failure Brother    Lung disease Brother    Diabetes Brother      Social History   Socioeconomic History   Marital status: Single    Spouse name: Not on file   Number of children: Not on file   Years of education: Not on file   Highest education level: Not on file  Occupational History   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: RETIRED  Tobacco Use   Smoking status: Former    Types: Cigars   Smokeless tobacco: Never   Tobacco comments:    quit 1990  Vaping Use   Vaping Use: Never used  Substance and Sexual Activity   Alcohol use: Yes    Comment: NOT ON A REGULAR BASIS   Drug use: No   Sexual activity: Not on file  Other Topics Concern   Not on file  Social History Narrative   Not on file   Social Determinants of Health   Financial Resource Strain: Not on file  Food Insecurity: Not on file  Transportation Needs: Not on file  Physical Activity: Not on file  Stress: Not on file  Social Connections: Not on file  Intimate Partner Violence: Not on file     BP 116/66   Pulse (!) 50   Ht 5' 7"$  (1.702 m)   Wt 154 lb 9.6 oz (70.1 kg)   SpO2 97%   BMI 24.21 kg/m   Physical Exam:  Well appearing NAD HEENT: Unremarkable Neck:  No JVD, no thyromegally Lymphatics:  No adenopathy Back:  No CVA tenderness Lungs:  Clear with no wheezes HEART:  Regular rate rhythm, no murmurs, no rubs, no clicks Abd:  soft, positive bowel sounds, no organomegally, no rebound, no guarding Ext:  2 plus pulses, no edema, no cyanosis, no clubbing Skin:  No rashes no  nodules Neuro:  CN II through XII intact, motor grossly intact  DEVICE  Normal device function.  See PaceArt for details.   Assess/Plan: Atrial fib/flutter - This appears to be worsening. I have recommended he start eliquis. He will continue his other meds. Chronic diastolic heart failure - he will continue his current meds. HTN - I suspect that this is related to his atrial fib/flutter. CHB - his [employer] PPM is working normally.  Carleene Overlie Clovis Mankins,MD

## 2022-12-01 NOTE — Telephone Encounter (Signed)
Pt has appt today 12/01/22 via telephone.

## 2022-12-13 ENCOUNTER — Ambulatory Visit: Payer: Medicare Other | Admitting: Internal Medicine

## 2022-12-15 ENCOUNTER — Telehealth: Payer: Self-pay | Admitting: Interventional Cardiology

## 2022-12-15 DIAGNOSIS — Z87891 Personal history of nicotine dependence: Secondary | ICD-10-CM | POA: Diagnosis not present

## 2022-12-15 DIAGNOSIS — I252 Old myocardial infarction: Secondary | ICD-10-CM | POA: Diagnosis not present

## 2022-12-15 DIAGNOSIS — R0902 Hypoxemia: Secondary | ICD-10-CM | POA: Diagnosis not present

## 2022-12-15 DIAGNOSIS — Z7982 Long term (current) use of aspirin: Secondary | ICD-10-CM | POA: Diagnosis not present

## 2022-12-15 DIAGNOSIS — J811 Chronic pulmonary edema: Secondary | ICD-10-CM | POA: Diagnosis not present

## 2022-12-15 DIAGNOSIS — I5032 Chronic diastolic (congestive) heart failure: Secondary | ICD-10-CM | POA: Diagnosis not present

## 2022-12-15 DIAGNOSIS — R918 Other nonspecific abnormal finding of lung field: Secondary | ICD-10-CM | POA: Diagnosis not present

## 2022-12-15 DIAGNOSIS — Z7984 Long term (current) use of oral hypoglycemic drugs: Secondary | ICD-10-CM | POA: Diagnosis not present

## 2022-12-15 DIAGNOSIS — I4891 Unspecified atrial fibrillation: Secondary | ICD-10-CM | POA: Diagnosis not present

## 2022-12-15 DIAGNOSIS — I509 Heart failure, unspecified: Secondary | ICD-10-CM | POA: Diagnosis not present

## 2022-12-15 DIAGNOSIS — I110 Hypertensive heart disease with heart failure: Secondary | ICD-10-CM | POA: Diagnosis not present

## 2022-12-15 DIAGNOSIS — J90 Pleural effusion, not elsewhere classified: Secondary | ICD-10-CM | POA: Diagnosis not present

## 2022-12-15 DIAGNOSIS — Z1152 Encounter for screening for COVID-19: Secondary | ICD-10-CM | POA: Diagnosis not present

## 2022-12-15 DIAGNOSIS — Z20822 Contact with and (suspected) exposure to covid-19: Secondary | ICD-10-CM | POA: Diagnosis not present

## 2022-12-15 DIAGNOSIS — Z79899 Other long term (current) drug therapy: Secondary | ICD-10-CM | POA: Diagnosis not present

## 2022-12-15 DIAGNOSIS — R0602 Shortness of breath: Secondary | ICD-10-CM | POA: Diagnosis not present

## 2022-12-15 NOTE — Telephone Encounter (Signed)
Pt recently seen in ER for BP concern and shortness of breath.  Pt daughter states Pt is taking Lasix to help with breathing as ordered by ER provider, and has follow up with PCP on Monday.  PCP will review Pt med list.      Pt daughter also states the Eliquis made his joints hurt, so Pt stopped taking medicine, and has gone back to taking ASA 81 mg.   I told her that I would let Dr. Lovena Le know, and see what he advises.    Pt daughter told if he needs future appointment at [facility] to contact us.  Pt daughter appreciated the call back.

## 2022-12-15 NOTE — Telephone Encounter (Signed)
Pt stated on Saturday he stop taking his eliquis due to joint pain. This morning he was seen in the ED for elevated blood pressure and shortness of breath. Pt and his daughter will like to know if an f/u appointment is needed. Dr. Lovena Le does not have any appointments available for the month of March.  Will forward to MD and nurse.

## 2022-12-15 NOTE — Telephone Encounter (Signed)
Pt daughter states pt was seen in ER today due to some swelling she states they increased his lasix from '20mg'$  to '40mg'$  and stopped his spironolactone. She states they also stopped his eliquis due to muscle fatigue. She wants to know what should pt do moving forward.   Pt has not established with new gen card yet.

## 2022-12-17 NOTE — Telephone Encounter (Signed)
No additional recs.

## 2022-12-19 DIAGNOSIS — I25119 Atherosclerotic heart disease of native coronary artery with unspecified angina pectoris: Secondary | ICD-10-CM | POA: Diagnosis not present

## 2022-12-19 DIAGNOSIS — I700 Atherosclerosis of aorta: Secondary | ICD-10-CM | POA: Diagnosis not present

## 2022-12-19 DIAGNOSIS — I5020 Unspecified systolic (congestive) heart failure: Secondary | ICD-10-CM | POA: Diagnosis not present

## 2022-12-19 DIAGNOSIS — N1832 Chronic kidney disease, stage 3b: Secondary | ICD-10-CM | POA: Diagnosis not present

## 2022-12-20 MED ORDER — ASPIRIN 81 MG PO TBEC: 81.0000 mg | DELAYED_RELEASE_TABLET | Freq: Every day | ORAL | Status: DC

## 2022-12-20 NOTE — Telephone Encounter (Signed)
Consulted Dr. Lovena Le regarding Eliquis increasing joint pain, and Pt deciding to discontinue use of Eliquis. Pt is now taking ASA 81 mg daily per Pt daughter.    Per Dr. Lovena Le order, notate Pt refusal to take Eliquis per stated Joint Pain, and ok for him to take ASA 81 mg daily.    Eliquis will be removed from Pt MAR on 12/20/2022.

## 2022-12-22 DIAGNOSIS — I509 Heart failure, unspecified: Secondary | ICD-10-CM | POA: Diagnosis not present

## 2023-01-10 ENCOUNTER — Ambulatory Visit (INDEPENDENT_AMBULATORY_CARE_PROVIDER_SITE_OTHER): Payer: Medicare Other

## 2023-01-10 ENCOUNTER — Other Ambulatory Visit: Payer: Self-pay

## 2023-01-10 DIAGNOSIS — I442 Atrioventricular block, complete: Secondary | ICD-10-CM

## 2023-01-10 LAB — CUP PACEART REMOTE DEVICE CHECK
Battery Remaining Longevity: 84 mo
Battery Remaining Percentage: 100 %
Brady Statistic RA Percent Paced: 0 %
Brady Statistic RV Percent Paced: 98 %
Date Time Interrogation Session: 20240326012000
Implantable Lead Connection Status: 753985
Implantable Lead Connection Status: 753985
Implantable Lead Implant Date: 20230911
Implantable Lead Implant Date: 20230911
Implantable Lead Location: 753859
Implantable Lead Location: 753860
Implantable Lead Model: 7840
Implantable Lead Model: 7841
Implantable Lead Serial Number: 104690
Implantable Lead Serial Number: 1094065
Implantable Pulse Generator Implant Date: 20230911
Lead Channel Impedance Value: 580 Ohm
Lead Channel Impedance Value: 679 Ohm
Lead Channel Setting Pacing Amplitude: 2.5 V
Lead Channel Setting Pacing Amplitude: 2.5 V
Lead Channel Setting Pacing Pulse Width: 0.4 ms
Lead Channel Setting Sensing Sensitivity: 2.5 mV
Pulse Gen Serial Number: 689048
Zone Setting Status: 755011

## 2023-01-10 MED ORDER — EMPAGLIFLOZIN 10 MG PO TABS: 10.0000 mg | ORAL_TABLET | Freq: Every day | ORAL | 0 refills | Status: DC

## 2023-02-10 DIAGNOSIS — I129 Hypertensive chronic kidney disease with stage 1 through stage 4 chronic kidney disease, or unspecified chronic kidney disease: Secondary | ICD-10-CM | POA: Diagnosis not present

## 2023-02-10 DIAGNOSIS — I10 Essential (primary) hypertension: Secondary | ICD-10-CM | POA: Diagnosis not present

## 2023-02-10 DIAGNOSIS — Z0000 Encounter for general adult medical examination without abnormal findings: Secondary | ICD-10-CM | POA: Diagnosis not present

## 2023-02-10 DIAGNOSIS — I25119 Atherosclerotic heart disease of native coronary artery with unspecified angina pectoris: Secondary | ICD-10-CM | POA: Diagnosis not present

## 2023-02-10 DIAGNOSIS — I447 Left bundle-branch block, unspecified: Secondary | ICD-10-CM | POA: Diagnosis not present

## 2023-02-10 DIAGNOSIS — N200 Calculus of kidney: Secondary | ICD-10-CM | POA: Diagnosis not present

## 2023-02-10 DIAGNOSIS — Z1331 Encounter for screening for depression: Secondary | ICD-10-CM | POA: Diagnosis not present

## 2023-02-21 NOTE — Progress Notes (Signed)
Remote pacemaker transmission.   

## 2023-02-23 ENCOUNTER — Other Ambulatory Visit: Payer: Self-pay

## 2023-02-23 MED ORDER — EMPAGLIFLOZIN 10 MG PO TABS: 10.0000 mg | ORAL_TABLET | Freq: Every day | ORAL | 3 refills | Status: DC

## 2023-03-06 DIAGNOSIS — K080 Exfoliation of teeth due to systemic causes: Secondary | ICD-10-CM | POA: Diagnosis not present

## 2023-03-14 ENCOUNTER — Other Ambulatory Visit: Payer: Self-pay | Admitting: *Deleted

## 2023-03-14 DIAGNOSIS — I10 Essential (primary) hypertension: Secondary | ICD-10-CM

## 2023-03-17 MED ORDER — ENTRESTO 24-26 MG PO TABS
1.0000 | ORAL_TABLET | Freq: Two times a day (BID) | ORAL | 5 refills | Status: DC
Start: 2023-03-17 — End: 2023-09-08

## 2023-03-17 NOTE — Telephone Encounter (Signed)
Pharmacy refill team contacted Dr Ladona Ridgel with refill request of Entresto 24-26 mg.    Pt saw Dr. Ladona Ridgel in clinic recently this year, 12/01/2022, and indicated in his provider note that Pt should continue anti-hypertensive medication.  Refill request submitted to [facility].     Pt will be contacted that refill was sent to Pharmacy.

## 2023-03-21 ENCOUNTER — Other Ambulatory Visit: Payer: Self-pay

## 2023-03-21 MED ORDER — FUROSEMIDE 20 MG PO TABS: 20.0000 mg | ORAL_TABLET | Freq: Every day | ORAL | 0 refills | Status: DC

## 2023-04-04 DIAGNOSIS — D3132 Benign neoplasm of left choroid: Secondary | ICD-10-CM | POA: Diagnosis not present

## 2023-04-04 DIAGNOSIS — H26493 Other secondary cataract, bilateral: Secondary | ICD-10-CM | POA: Diagnosis not present

## 2023-04-04 DIAGNOSIS — H353131 Nonexudative age-related macular degeneration, bilateral, early dry stage: Secondary | ICD-10-CM | POA: Diagnosis not present

## 2023-04-11 ENCOUNTER — Ambulatory Visit (INDEPENDENT_AMBULATORY_CARE_PROVIDER_SITE_OTHER): Payer: Medicare Other

## 2023-04-11 DIAGNOSIS — I442 Atrioventricular block, complete: Secondary | ICD-10-CM

## 2023-04-12 LAB — CUP PACEART REMOTE DEVICE CHECK
Battery Remaining Longevity: 90 mo
Battery Remaining Percentage: 100 %
Brady Statistic RA Percent Paced: 5 %
Brady Statistic RV Percent Paced: 99 %
Date Time Interrogation Session: 20240625012100
Implantable Lead Connection Status: 753985
Implantable Lead Connection Status: 753985
Implantable Lead Implant Date: 20230911
Implantable Lead Implant Date: 20230911
Implantable Lead Location: 753859
Implantable Lead Location: 753860
Implantable Lead Model: 7840
Implantable Lead Model: 7841
Implantable Lead Serial Number: 104690
Implantable Lead Serial Number: 1094065
Implantable Pulse Generator Implant Date: 20230911
Lead Channel Impedance Value: 578 Ohm
Lead Channel Impedance Value: 617 Ohm
Lead Channel Setting Pacing Amplitude: 2.5 V
Lead Channel Setting Pacing Amplitude: 2.5 V
Lead Channel Setting Pacing Pulse Width: 0.4 ms
Lead Channel Setting Sensing Sensitivity: 2.5 mV
Pulse Gen Serial Number: 689048
Zone Setting Status: 755011

## 2023-04-14 ENCOUNTER — Other Ambulatory Visit: Payer: Self-pay | Admitting: *Deleted

## 2023-04-14 MED ORDER — FUROSEMIDE 20 MG PO TABS: 20.0000 mg | ORAL_TABLET | Freq: Every day | ORAL | 1 refills | Status: DC

## 2023-04-17 DIAGNOSIS — L570 Actinic keratosis: Secondary | ICD-10-CM | POA: Diagnosis not present

## 2023-04-17 DIAGNOSIS — L280 Lichen simplex chronicus: Secondary | ICD-10-CM | POA: Diagnosis not present

## 2023-04-17 DIAGNOSIS — L821 Other seborrheic keratosis: Secondary | ICD-10-CM | POA: Diagnosis not present

## 2023-04-17 DIAGNOSIS — Z85828 Personal history of other malignant neoplasm of skin: Secondary | ICD-10-CM | POA: Diagnosis not present

## 2023-04-17 DIAGNOSIS — L814 Other melanin hyperpigmentation: Secondary | ICD-10-CM | POA: Diagnosis not present

## 2023-05-03 DIAGNOSIS — H353134 Nonexudative age-related macular degeneration, bilateral, advanced atrophic with subfoveal involvement: Secondary | ICD-10-CM | POA: Diagnosis not present

## 2023-05-03 DIAGNOSIS — H26491 Other secondary cataract, right eye: Secondary | ICD-10-CM | POA: Diagnosis not present

## 2023-05-05 NOTE — Progress Notes (Signed)
Remote pacemaker transmission.   

## 2023-06-01 DIAGNOSIS — H353114 Nonexudative age-related macular degeneration, right eye, advanced atrophic with subfoveal involvement: Secondary | ICD-10-CM | POA: Diagnosis not present

## 2023-07-05 DIAGNOSIS — I5020 Unspecified systolic (congestive) heart failure: Secondary | ICD-10-CM | POA: Diagnosis not present

## 2023-07-05 DIAGNOSIS — N1832 Chronic kidney disease, stage 3b: Secondary | ICD-10-CM | POA: Diagnosis not present

## 2023-07-05 DIAGNOSIS — Z0000 Encounter for general adult medical examination without abnormal findings: Secondary | ICD-10-CM | POA: Diagnosis not present

## 2023-07-05 DIAGNOSIS — I129 Hypertensive chronic kidney disease with stage 1 through stage 4 chronic kidney disease, or unspecified chronic kidney disease: Secondary | ICD-10-CM | POA: Diagnosis not present

## 2023-07-10 DIAGNOSIS — H524 Presbyopia: Secondary | ICD-10-CM | POA: Diagnosis not present

## 2023-07-20 ENCOUNTER — Ambulatory Visit (INDEPENDENT_AMBULATORY_CARE_PROVIDER_SITE_OTHER): Payer: Medicare Other

## 2023-07-20 DIAGNOSIS — I442 Atrioventricular block, complete: Secondary | ICD-10-CM | POA: Diagnosis not present

## 2023-07-20 LAB — CUP PACEART REMOTE DEVICE CHECK
Battery Remaining Longevity: 90 mo
Battery Remaining Percentage: 100 %
Brady Statistic RA Percent Paced: 10 %
Brady Statistic RV Percent Paced: 99 %
Date Time Interrogation Session: 20241003012200
Implantable Lead Connection Status: 753985
Implantable Lead Connection Status: 753985
Implantable Lead Implant Date: 20230911
Implantable Lead Implant Date: 20230911
Implantable Lead Location: 753859
Implantable Lead Location: 753860
Implantable Lead Model: 7840
Implantable Lead Model: 7841
Implantable Lead Serial Number: 104690
Implantable Lead Serial Number: 1094065
Implantable Pulse Generator Implant Date: 20230911
Lead Channel Impedance Value: 519 Ohm
Lead Channel Impedance Value: 609 Ohm
Lead Channel Setting Pacing Amplitude: 2.5 V
Lead Channel Setting Pacing Amplitude: 2.5 V
Lead Channel Setting Pacing Pulse Width: 0.4 ms
Lead Channel Setting Sensing Sensitivity: 2.5 mV
Pulse Gen Serial Number: 689048
Zone Setting Status: 755011

## 2023-07-27 DIAGNOSIS — Z23 Encounter for immunization: Secondary | ICD-10-CM | POA: Diagnosis not present

## 2023-08-04 NOTE — Progress Notes (Signed)
Remote pacemaker transmission.   

## 2023-08-14 DIAGNOSIS — I509 Heart failure, unspecified: Secondary | ICD-10-CM | POA: Diagnosis not present

## 2023-08-14 DIAGNOSIS — Z950 Presence of cardiac pacemaker: Secondary | ICD-10-CM | POA: Diagnosis not present

## 2023-09-08 ENCOUNTER — Other Ambulatory Visit: Payer: Self-pay | Admitting: Internal Medicine

## 2023-09-08 DIAGNOSIS — I10 Essential (primary) hypertension: Secondary | ICD-10-CM

## 2023-09-20 DIAGNOSIS — K080 Exfoliation of teeth due to systemic causes: Secondary | ICD-10-CM | POA: Diagnosis not present

## 2023-09-21 DIAGNOSIS — K080 Exfoliation of teeth due to systemic causes: Secondary | ICD-10-CM | POA: Diagnosis not present

## 2023-10-19 ENCOUNTER — Ambulatory Visit (INDEPENDENT_AMBULATORY_CARE_PROVIDER_SITE_OTHER): Payer: Self-pay

## 2023-10-19 DIAGNOSIS — I442 Atrioventricular block, complete: Secondary | ICD-10-CM | POA: Diagnosis not present

## 2023-10-19 LAB — CUP PACEART REMOTE DEVICE CHECK
Battery Remaining Longevity: 84 mo
Battery Remaining Percentage: 100 %
Brady Statistic RA Percent Paced: 11 %
Brady Statistic RV Percent Paced: 99 %
Date Time Interrogation Session: 20250102012100
Implantable Lead Connection Status: 753985
Implantable Lead Connection Status: 753985
Implantable Lead Implant Date: 20230911
Implantable Lead Implant Date: 20230911
Implantable Lead Location: 753859
Implantable Lead Location: 753860
Implantable Lead Model: 7840
Implantable Lead Model: 7841
Implantable Lead Serial Number: 104690
Implantable Lead Serial Number: 1094065
Implantable Pulse Generator Implant Date: 20230911
Lead Channel Impedance Value: 556 Ohm
Lead Channel Impedance Value: 640 Ohm
Lead Channel Setting Pacing Amplitude: 2.5 V
Lead Channel Setting Pacing Amplitude: 2.5 V
Lead Channel Setting Pacing Pulse Width: 0.4 ms
Lead Channel Setting Sensing Sensitivity: 2.5 mV
Pulse Gen Serial Number: 689048
Zone Setting Status: 755011

## 2023-11-03 ENCOUNTER — Other Ambulatory Visit: Payer: Self-pay

## 2023-11-03 DIAGNOSIS — I10 Essential (primary) hypertension: Secondary | ICD-10-CM

## 2023-11-03 MED ORDER — ENTRESTO 24-26 MG PO TABS
1.0000 | ORAL_TABLET | Freq: Two times a day (BID) | ORAL | 1 refills | Status: DC
Start: 2023-11-03 — End: 2023-11-07

## 2023-11-07 ENCOUNTER — Telehealth: Payer: Self-pay | Admitting: Internal Medicine

## 2023-11-07 DIAGNOSIS — I10 Essential (primary) hypertension: Secondary | ICD-10-CM

## 2023-11-07 MED ORDER — ENTRESTO 24-26 MG PO TABS
1.0000 | ORAL_TABLET | Freq: Two times a day (BID) | ORAL | 0 refills | Status: DC
Start: 2023-11-07 — End: 2024-01-23

## 2023-11-07 NOTE — Telephone Encounter (Signed)
Pt's medication was sent to pt's pharmacy as requested. Confirmation received.  °

## 2023-11-07 NOTE — Telephone Encounter (Signed)
*  STAT* If patient is at the pharmacy, call can be transferred to refill team.   1. Which medications need to be refilled? (please list name of each medication and dose if known)   sacubitril-valsartan (ENTRESTO) 24-26 MG   2. Would you like to learn more about the convenience, safety, & potential cost savings by using the [facility]?   3. Are you open to using the [facility] (Type [facility]. ).  4. Which pharmacy/location (including street and city if local pharmacy) is medication to be sent to?  [facility] - [place], [place] - [address]   5. Do they need a 30 day or 90 day supply?   90 day  Caller Dahlia Client) stated patient still has some medication left.

## 2023-11-24 DIAGNOSIS — D044 Carcinoma in situ of skin of scalp and neck: Secondary | ICD-10-CM | POA: Diagnosis not present

## 2023-11-24 DIAGNOSIS — L570 Actinic keratosis: Secondary | ICD-10-CM | POA: Diagnosis not present

## 2023-11-29 NOTE — Progress Notes (Signed)
Remote pacemaker transmission.

## 2023-12-08 ENCOUNTER — Other Ambulatory Visit: Payer: Self-pay | Admitting: Internal Medicine

## 2023-12-22 DIAGNOSIS — R062 Wheezing: Secondary | ICD-10-CM | POA: Diagnosis not present

## 2023-12-22 DIAGNOSIS — I2510 Atherosclerotic heart disease of native coronary artery without angina pectoris: Secondary | ICD-10-CM | POA: Diagnosis not present

## 2023-12-22 DIAGNOSIS — Z7982 Long term (current) use of aspirin: Secondary | ICD-10-CM | POA: Diagnosis not present

## 2023-12-22 DIAGNOSIS — A419 Sepsis, unspecified organism: Secondary | ICD-10-CM | POA: Diagnosis not present

## 2023-12-22 DIAGNOSIS — I110 Hypertensive heart disease with heart failure: Secondary | ICD-10-CM | POA: Diagnosis not present

## 2023-12-22 DIAGNOSIS — I509 Heart failure, unspecified: Secondary | ICD-10-CM | POA: Diagnosis not present

## 2023-12-22 DIAGNOSIS — I10 Essential (primary) hypertension: Secondary | ICD-10-CM | POA: Diagnosis not present

## 2023-12-22 DIAGNOSIS — R0902 Hypoxemia: Secondary | ICD-10-CM | POA: Diagnosis not present

## 2023-12-22 DIAGNOSIS — Z79899 Other long term (current) drug therapy: Secondary | ICD-10-CM | POA: Diagnosis not present

## 2023-12-22 DIAGNOSIS — Z1152 Encounter for screening for COVID-19: Secondary | ICD-10-CM | POA: Diagnosis not present

## 2023-12-22 DIAGNOSIS — R0600 Dyspnea, unspecified: Secondary | ICD-10-CM | POA: Diagnosis not present

## 2023-12-22 DIAGNOSIS — R0689 Other abnormalities of breathing: Secondary | ICD-10-CM | POA: Diagnosis not present

## 2023-12-22 DIAGNOSIS — R0602 Shortness of breath: Secondary | ICD-10-CM | POA: Diagnosis not present

## 2023-12-22 DIAGNOSIS — Z87891 Personal history of nicotine dependence: Secondary | ICD-10-CM | POA: Diagnosis not present

## 2023-12-24 DIAGNOSIS — R0602 Shortness of breath: Secondary | ICD-10-CM | POA: Diagnosis not present

## 2023-12-28 DIAGNOSIS — I5020 Unspecified systolic (congestive) heart failure: Secondary | ICD-10-CM | POA: Diagnosis not present

## 2023-12-28 DIAGNOSIS — I129 Hypertensive chronic kidney disease with stage 1 through stage 4 chronic kidney disease, or unspecified chronic kidney disease: Secondary | ICD-10-CM | POA: Diagnosis not present

## 2023-12-28 DIAGNOSIS — J40 Bronchitis, not specified as acute or chronic: Secondary | ICD-10-CM | POA: Diagnosis not present

## 2023-12-28 DIAGNOSIS — I25119 Atherosclerotic heart disease of native coronary artery with unspecified angina pectoris: Secondary | ICD-10-CM | POA: Diagnosis not present

## 2024-01-01 ENCOUNTER — Encounter: Payer: Self-pay | Admitting: Cardiology

## 2024-01-01 ENCOUNTER — Ambulatory Visit: Attending: Cardiology | Admitting: Cardiology

## 2024-01-01 VITALS — BP 142/60 | HR 82 | Ht 67.0 in | Wt 150.4 lb

## 2024-01-01 DIAGNOSIS — I480 Paroxysmal atrial fibrillation: Secondary | ICD-10-CM | POA: Diagnosis not present

## 2024-01-01 DIAGNOSIS — I495 Sick sinus syndrome: Secondary | ICD-10-CM | POA: Diagnosis not present

## 2024-01-01 DIAGNOSIS — I25118 Atherosclerotic heart disease of native coronary artery with other forms of angina pectoris: Secondary | ICD-10-CM

## 2024-01-01 DIAGNOSIS — I10 Essential (primary) hypertension: Secondary | ICD-10-CM

## 2024-01-01 DIAGNOSIS — I5020 Unspecified systolic (congestive) heart failure: Secondary | ICD-10-CM | POA: Insufficient documentation

## 2024-01-01 MED ORDER — SPIRONOLACTONE 25 MG PO TABS: 12.5000 mg | ORAL_TABLET | Freq: Every day | ORAL | Status: DC

## 2024-01-01 MED ORDER — APIXABAN 5 MG PO TABS: 5.0000 mg | ORAL_TABLET | Freq: Two times a day (BID) | ORAL | 6 refills | Status: DC

## 2024-01-01 NOTE — Progress Notes (Signed)
 Cardiology Office Note:  .   Date:  01/01/2024  ID:  Brandon Robles, DOB 02-Mar-1926, MRN [id number] PCP: Lorenda Ishihara, MD  [facility] Providers Cardiologist:  Truett Mainland, MD PCP: Lorenda Ishihara, MD  Chief Complaint  Patient presents with   HFrEF      History of Present Illness: .    Brandon Robles is a 89 y.o. male with hypertension, hyperlipidemia, CAD w/ prior PCI's, h/o pericarditis, sinus node dysfunction, CHB now s/p [employer] dual chamber PPM, paroxysmal Afib/flutter, HFrEF-w/recovered LVEF   Patient is a remarkably active 89 year old gentleman who still lives independently, and works on a farm raising cows.  His daughter lives next-door, and is able to check on him.  Recently, he was seen at [facility] for complaints of sinus congestion.  Workup at that time showed elevated proBNP.  It appears that he is not taking spironolactone 12.5 mg listed on his medications.  He reports feeling "drunk" after he took it at night long time ago.  Since then, he stopped taking spironolactone.  He denies any exertional chest pain.  He has pain with certain movements after he broke his ribs sometime back.  He does report mild exertional dyspnea.  On a separate note, patient is not on anticoagulation and is unaware of A-fib diagnosis.   Vitals:   01/01/24 1132  BP: (!) 142/60  Pulse: 82  SpO2: 94%     ROS:  Review of Systems  Cardiovascular:  Positive for dyspnea on exertion. Negative for chest pain, leg swelling, palpitations and syncope.     Studies Reviewed: [name]    EKG 01/01/2024: Ventricular-paced rhythm When compared with ECG of 17-Aug-2021 06:53, Electronic ventricular pacemaker has replaced Atrial fibrillation    Independently interpreted 12/2023: Hb 14.1 Cr 1.4, eGFR 46, Na/K 135/3.8 ProBNP 747  External echocardiogram 06/2022 [facility]): LVEF 55%.  Mid anterior, mid anteroseptal hypokinesis. Mild biatrial dilatation. Moderate aortic  regurgitation.    Independently interpreted Echocardiogram 07/2021: Distal LAD territory hypokinesis/akinesis LVEF 30-35% Mildly dilated RV, normal RV systolic function.. Estimated RVSP 63 mmHg Mod biatrial dilatation Mod MR Mod to severe TR Aortic sclerosis   Risk Assessment/Calculations:    CHA2DS2-VASc Score = 4  This indicates a 4.8% annual risk of stroke. The patient's score is based upon: CHF History: 1 HTN History: 1 Diabetes History: 0 Stroke History: 0 Vascular Disease History: 0 Age Score: 2 Gender Score: 0     Physical Exam:   Physical Exam Vitals and nursing note reviewed.  Constitutional:      General: He is not in acute distress. Neck:     Vascular: No JVD.  Cardiovascular:     Rate and Rhythm: Normal rate and regular rhythm.     Heart sounds: Normal heart sounds. No murmur heard. Pulmonary:     Effort: Pulmonary effort is normal.     Breath sounds: Normal breath sounds. No wheezing or rales.  Musculoskeletal:     Right lower leg: Edema (1+) present.     Left lower leg: Edema (1+) present.      VISIT DIAGNOSES:   ICD-10-CM   1. Primary hypertension  I10 EKG 12-Lead    2. Coronary artery disease of native artery of native heart with stable angina pectoris (HCC)  I25.118     3. Sinus node dysfunction (HCC)  I49.5 EKG 12-Lead    4. PAF (paroxysmal atrial fibrillation) (HCC)  I48.0 EKG 12-Lead    5. HFrEF (heart failure with reduced ejection fraction) (  HCC)  I50.20 ECHOCARDIOGRAM COMPLETE    Basic metabolic panel    Pro b natriuretic peptide (BNP)    Pro b natriuretic peptide (BNP)    Basic metabolic panel    AMB Referral to [facility]       ASSESSMENT AND PLAN: .    Brandon Robles is a 89 y.o. male with hypertension, hyperlipidemia, CAD w/ prior PCI's, h/o pericarditis, sinus node dysfunction, CHB now s/p [employer] dual chamber PPM, paroxysmal Afib/flutter, HFrEF-w/recovered LVEF   HFrEF: EF 30 to 35% in the past, 50-55%  on most recent echocardiogram in 2023. Recent leg edema, mild dyspnea on exertion, elevated proBNP. Start taking spironolactone 12.5 mg daily. Continue Entresto 24/26 mg twice daily, Jardiance 10 mg daily, metoprolol succinate 50 mg daily, Lasix 20 mg daily. Check BMP and proBNP in 1-2 weeks, along with repeat echocardiogram. Follow-up with Pharm.D. for HFrEF medication up titration.  Hopefully, we can go up on either spironolactone or Entresto dose, and possibly come off Lasix.  PAF: Noted on his pacemaker.  He is not on anticoagulation at this time.  I do think benefits outweigh risks. Started Eliquis 5 mg twice daily. With initiation of Eliquis, I have stopped his aspirin, since his last PCI has been several years ago.       Meds ordered this encounter  Medications   spironolactone (ALDACTONE) 25 MG tablet    Sig: Take 0.5 tablets (12.5 mg total) by mouth daily.   apixaban (ELIQUIS) 5 MG TABS tablet    Sig: Take 1 tablet (5 mg total) by mouth 2 (two) times daily.    Dispense:  60 tablet    Refill:  6     F/u in 6 months w/me  Signed, Elder Negus, MD

## 2024-01-01 NOTE — Patient Instructions (Addendum)
 Medication Instructions:  START Eliquis 5 mg take one tablet twice daily  RESTART Spironolactone 12.5 mg in the morning   STOP Aspirin   *If you need a refill on your cardiac medications before your next appointment, please call your pharmacy*   Lab Work IN 1 TO 2 WEEKS: BMP ProBNP   If you have labs (blood work) drawn today and your tests are completely normal, you will receive your results only by: MyChart Message (if you have MyChart) OR A paper copy in the mail If you have any lab test that is abnormal or we need to change your treatment, we will call you to review the results.   Testing/Procedures: Echo IN 1 TO 2 WEEKS   Your physician has requested that you have an echocardiogram. Echocardiography is a painless test that uses sound waves to create images of your heart. It provides your doctor with information about the size and shape of your heart and how well your heart's chambers and valves are working. This procedure takes approximately one hour. There are no restrictions for this procedure. Please do NOT wear cologne, perfume, aftershave, or lotions (deodorant is allowed). Please arrive 15 minutes prior to your appointment time.  Please note: We ask at that you not bring children with you during ultrasound (echo/ vascular) testing. Due to room size and safety concerns, children are not allowed in the ultrasound rooms during exams. Our front office staff cannot provide observation of children in our lobby area while testing is being conducted. An adult accompanying a patient to their appointment will only be allowed in the ultrasound room at the discretion of the ultrasound technician under special circumstances. We apologize for any inconvenience.    Follow-Up: At [facility], you and your health needs are our priority.  As part of our continuing mission to provide you with exceptional heart care, we have created designated Provider Care Teams.  These Care Teams  include your primary Cardiologist (physician) and Advanced Practice Providers (APPs -  Physician Assistants and Nurse Practitioners) who all work together to provide you with the care you need, when you need it.  We recommend signing up for the patient portal called "MyChart".  Sign up information is provided on this After Visit Summary.  MyChart is used to connect with patients for Virtual Visits (Telemedicine).  Patients are able to view lab/test results, encounter notes, upcoming appointments, etc.  Non-urgent messages can be sent to your provider as well.   To learn more about what you can do with MyChart, go to [email or web address].    Your next appointment:   6 month(s)  Provider:   Elder Negus, MD     Other Instructions FOLLOW UP IN 4 WEEKS WITH PHARMD

## 2024-01-03 DIAGNOSIS — I5020 Unspecified systolic (congestive) heart failure: Secondary | ICD-10-CM | POA: Diagnosis not present

## 2024-01-03 LAB — BASIC METABOLIC PANEL
BUN/Creatinine Ratio: 20 (ref 10–24)
BUN: 28 mg/dL (ref 10–36)
CO2: 24 mmol/L (ref 20–29)
Calcium: 9.3 mg/dL (ref 8.6–10.2)
Chloride: 105 mmol/L (ref 96–106)
Creatinine, Ser: 1.4 mg/dL — ABNORMAL HIGH (ref 0.76–1.27)
Glucose: 101 mg/dL — ABNORMAL HIGH (ref 70–99)
Potassium: 4.3 mmol/L (ref 3.5–5.2)
Sodium: 144 mmol/L (ref 134–144)
eGFR: 46 mL/min/{1.73_m2} — ABNORMAL LOW (ref >59)

## 2024-01-03 LAB — PRO B NATRIURETIC PEPTIDE: NT-Pro BNP: 875 pg/mL — ABNORMAL HIGH (ref 0–486)

## 2024-01-04 ENCOUNTER — Encounter: Payer: Self-pay | Admitting: Cardiology

## 2024-01-04 DIAGNOSIS — N189 Chronic kidney disease, unspecified: Secondary | ICD-10-CM | POA: Diagnosis not present

## 2024-01-04 DIAGNOSIS — R0602 Shortness of breath: Secondary | ICD-10-CM | POA: Diagnosis not present

## 2024-01-04 DIAGNOSIS — I5042 Chronic combined systolic (congestive) and diastolic (congestive) heart failure: Secondary | ICD-10-CM | POA: Diagnosis not present

## 2024-01-04 DIAGNOSIS — E1122 Type 2 diabetes mellitus with diabetic chronic kidney disease: Secondary | ICD-10-CM | POA: Diagnosis not present

## 2024-01-04 DIAGNOSIS — Z7982 Long term (current) use of aspirin: Secondary | ICD-10-CM | POA: Diagnosis not present

## 2024-01-04 DIAGNOSIS — Z79899 Other long term (current) drug therapy: Secondary | ICD-10-CM | POA: Diagnosis not present

## 2024-01-04 DIAGNOSIS — J9811 Atelectasis: Secondary | ICD-10-CM | POA: Diagnosis not present

## 2024-01-04 DIAGNOSIS — R0609 Other forms of dyspnea: Secondary | ICD-10-CM | POA: Diagnosis not present

## 2024-01-04 DIAGNOSIS — J90 Pleural effusion, not elsewhere classified: Secondary | ICD-10-CM | POA: Diagnosis not present

## 2024-01-04 DIAGNOSIS — I130 Hypertensive heart and chronic kidney disease with heart failure and stage 1 through stage 4 chronic kidney disease, or unspecified chronic kidney disease: Secondary | ICD-10-CM | POA: Diagnosis not present

## 2024-01-04 NOTE — Progress Notes (Signed)
 On further discussion about his symptoms, I agree with 911 and urgent ER evaluation.  Thanks MJP

## 2024-01-04 NOTE — Progress Notes (Signed)
 Kidney function is better.  Marker of congestion also improved. Continue current medications. He follow-up with Pharm.D. for heart failure medication management, when scheduled.  Thanks MJP

## 2024-01-04 NOTE — Progress Notes (Signed)
 While his numbers are looking better, if he is very symptomatic with dyspnea, I recommend the following: Increase Lasix to 40 mg daily. If symptoms are not improving, or getting worse, he may need urgent evaluation.  We can either have him come in for an urgent DOD visit, or call 911 for ER evaluation.  Thanks MJP

## 2024-01-07 ENCOUNTER — Other Ambulatory Visit: Payer: Self-pay | Admitting: Internal Medicine

## 2024-01-07 DIAGNOSIS — R0609 Other forms of dyspnea: Secondary | ICD-10-CM | POA: Diagnosis not present

## 2024-01-08 ENCOUNTER — Encounter: Payer: Self-pay | Admitting: Cardiology

## 2024-01-08 ENCOUNTER — Ambulatory Visit: Attending: Cardiology | Admitting: Cardiology

## 2024-01-08 VITALS — BP 126/60 | HR 77 | Ht 67.0 in | Wt 147.6 lb

## 2024-01-08 DIAGNOSIS — I5020 Unspecified systolic (congestive) heart failure: Secondary | ICD-10-CM | POA: Diagnosis not present

## 2024-01-08 DIAGNOSIS — I480 Paroxysmal atrial fibrillation: Secondary | ICD-10-CM | POA: Diagnosis not present

## 2024-01-08 MED ORDER — SPIRONOLACTONE 25 MG PO TABS: 25.0000 mg | ORAL_TABLET | Freq: Every day | ORAL | 3 refills | Status: DC

## 2024-01-08 NOTE — Patient Instructions (Signed)
 Medication Instructions:  INCREASE SPIRONOLACTONE TO TAKE ONE TABLET BY MOUTH IN THE MORNING   *If you need a refill on your cardiac medications before your next appointment, please call your pharmacy*   Lab Work: BMP PROBNP  If you have labs (blood work) drawn today and your tests are completely normal, you will receive your results only by: MyChart Message (if you have MyChart) OR A paper copy in the mail If you have any lab test that is abnormal or we need to change your treatment, we will call you to review the results.   Testing/Procedures: KEEP ECHO APPOINTMENT    Follow-Up: At [facility], you and your health needs are our priority.  As part of our continuing mission to provide you with exceptional heart care, we have created designated Provider Care Teams.  These Care Teams include your primary Cardiologist (physician) and Advanced Practice Providers (APPs -  Physician Assistants and Nurse Practitioners) who all work together to provide you with the care you need, when you need it.  We recommend signing up for the patient portal called "MyChart".  Sign up information is provided on this After Visit Summary.  MyChart is used to connect with patients for Virtual Visits (Telemedicine).  Patients are able to view lab/test results, encounter notes, upcoming appointments, etc.  Non-urgent messages can be sent to your provider as well.   To learn more about what you can do with MyChart, go to [email or web address].    Your next appointment:   KEEP UPCOMING APPOINTMENT   Provider:   Elder Negus, MD     Other Instructions      1st Floor: - Lobby - Registration  - Pharmacy  - Lab - Cafe  2nd Floor: - PV Lab - Diagnostic Testing (echo, CT, nuclear med)  3rd Floor: - Vacant  4th Floor: - TCTS (cardiothoracic surgery) - AFib Clinic - Structural Heart Clinic - Vascular Surgery  - Vascular Ultrasound  5th Floor: - HeartCare Cardiology (general  and EP) - Clinical Pharmacy for coumadin, hypertension, lipid, weight-loss medications, and med management appointments    Valet parking services will be available as well.

## 2024-01-08 NOTE — Progress Notes (Signed)
 Cardiology Office Note:  .   Date:  01/08/2024  ID:  Brandon Robles, DOB 09-Nov-1925, MRN [id number] PCP: Lorenda Ishihara, MD  [facility] Providers Cardiologist:  Truett Mainland, MD PCP: Lorenda Ishihara, MD   Chief Complaint  Patient presents with   Shortness of Breath      History of Present Illness: .    Brandon Robles is a 89 y.o. male with hypertension, hyperlipidemia, CAD w/ prior PCI's, h/o pericarditis, sinus node dysfunction, CHB now s/p [employer] dual chamber PPM, paroxysmal Afib/flutter, HFrEF-w/recovered LVEF   Since his last visit with me, he had worsening shortness of breath was also noticed during telephone call with my nurse last week.  He was recommended to go to emergency room.  He will go to [facility] emergency room.  Workup showed mildly elevated and higher than baseline proBNP at 1350.  He was given IV Lasix 40 mg with improvement of his symptoms.  Patient is here today with his son.  His shortness of breath has significantly improved.  Discussion regarding diet, mention mentioned that he eats ham country biscuit almost every day and his favorite caf.     Vitals:   01/08/24 1412  BP: 126/60  Pulse: 77  SpO2: 95%      ROS:  Review of Systems  Cardiovascular:  Positive for dyspnea on exertion. Negative for chest pain, leg swelling, palpitations and syncope.     Studies Reviewed: [name]    EKG 01/01/2024: Ventricular-paced rhythm When compared with ECG of 17-Aug-2021 06:53, Electronic ventricular pacemaker has replaced Atrial fibrillation    Independently interpreted 01/04/2024: Hb 12.8 Cr 1.3 Trop I 0.046, 0.045 ProBNP 1350  12/2023: Hb 14.1 Cr 1.4, eGFR 46, Na/K 135/3.8 ProBNP 875  External echocardiogram 06/2022 [facility]): LVEF 55%.  Mid anterior, mid anteroseptal hypokinesis. Mild biatrial dilatation. Moderate aortic regurgitation.    Independently interpreted Echocardiogram 07/2021: Distal LAD territory  hypokinesis/akinesis LVEF 30-35% Mildly dilated RV, normal RV systolic function.. Estimated RVSP 63 mmHg Mod biatrial dilatation Mod MR Mod to severe TR Aortic sclerosis   Risk Assessment/Calculations:    CHA2DS2-VASc Score = 4  This indicates a 4.8% annual risk of stroke. The patient's score is based upon: CHF History: 1 HTN History: 1 Diabetes History: 0 Stroke History: 0 Vascular Disease History: 0 Age Score: 2 Gender Score: 0     Physical Exam:   Physical Exam Vitals and nursing note reviewed.  Constitutional:      General: He is not in acute distress. Neck:     Vascular: No JVD.  Cardiovascular:     Rate and Rhythm: Normal rate and regular rhythm.     Heart sounds: Normal heart sounds. No murmur heard. Pulmonary:     Effort: Pulmonary effort is normal.     Breath sounds: Normal breath sounds. No wheezing or rales.  Musculoskeletal:     Right lower leg: Edema (1+) present.     Left lower leg: Edema (1+) present.      VISIT DIAGNOSES:   ICD-10-CM   1. HFrEF (heart failure with reduced ejection fraction) (HCC)  I50.20 Pro b natriuretic peptide (BNP)    Basic metabolic panel    Basic metabolic panel    Pro b natriuretic peptide (BNP)    2. PAF (paroxysmal atrial fibrillation) (HCC)  I48.0         ASSESSMENT AND PLAN: .    Brandon Robles is a 89 y.o. male with hypertension, hyperlipidemia, CAD w/ prior PCI's,  h/o pericarditis, sinus node dysfunction, CHB now s/p [employer] dual chamber PPM, paroxysmal Afib/flutter, HFrEF-w/recovered LVEF   HFrEF: EF 30 to 35% in the past, 50-55% on most recent echocardiogram in 2023. Recent heart failure exacerbation requiring ER visit. Counseled regarding diet modification, especially cutting down salt intake. Increase spironolactone from 12.5 to 25 mg daily. Continue Entresto 24/26 mg twice daily, Jardiance 10 mg daily, metoprolol succinate 50 mg daily, Lasix 20 mg daily. Continue Lasix 20 mg daily, with  additional dose as needed for shortness of breath.   Check BMP and proBNP in 1 week. I will see him after his echocardiogram on 01/22/2024.  PAF: Continue d Eliquis 5 mg twice daily.       Meds ordered this encounter  Medications   spironolactone (ALDACTONE) 25 MG tablet    Sig: Take 1 tablet (25 mg total) by mouth daily.    Dispense:  90 tablet    Refill:  3     F/u in 6 months w/me  Signed, Elder Negus, MD

## 2024-01-15 DIAGNOSIS — I5020 Unspecified systolic (congestive) heart failure: Secondary | ICD-10-CM | POA: Diagnosis not present

## 2024-01-16 LAB — BASIC METABOLIC PANEL WITH GFR
BUN/Creatinine Ratio: 23 (ref 10–24)
BUN: 33 mg/dL (ref 10–36)
CO2: 25 mmol/L (ref 20–29)
Calcium: 9.6 mg/dL (ref 8.6–10.2)
Chloride: 102 mmol/L (ref 96–106)
Creatinine, Ser: 1.44 mg/dL — ABNORMAL HIGH (ref 0.76–1.27)
Glucose: 94 mg/dL (ref 70–99)
Potassium: 5.3 mmol/L — ABNORMAL HIGH (ref 3.5–5.2)
Sodium: 141 mmol/L (ref 134–144)
eGFR: 45 mL/min/{1.73_m2} — ABNORMAL LOW (ref >59)

## 2024-01-16 LAB — PRO B NATRIURETIC PEPTIDE: NT-Pro BNP: 390 pg/mL (ref 0–486)

## 2024-01-16 NOTE — Progress Notes (Signed)
 Marker of congestion significantly improved.  Kidney function is stable.  Continue current medications.  Thanks MJP

## 2024-01-18 ENCOUNTER — Ambulatory Visit (INDEPENDENT_AMBULATORY_CARE_PROVIDER_SITE_OTHER): Payer: Self-pay

## 2024-01-18 DIAGNOSIS — I495 Sick sinus syndrome: Secondary | ICD-10-CM | POA: Diagnosis not present

## 2024-01-18 LAB — CUP PACEART REMOTE DEVICE CHECK
Battery Remaining Longevity: 78 mo
Battery Remaining Percentage: 100 %
Brady Statistic RA Percent Paced: 11 %
Brady Statistic RV Percent Paced: 99 %
Date Time Interrogation Session: 20250403012100
Implantable Lead Connection Status: 753985
Implantable Lead Connection Status: 753985
Implantable Lead Implant Date: 20230911
Implantable Lead Implant Date: 20230911
Implantable Lead Location: 753859
Implantable Lead Location: 753860
Implantable Lead Model: 7840
Implantable Lead Model: 7841
Implantable Lead Serial Number: 104690
Implantable Lead Serial Number: 1094065
Implantable Pulse Generator Implant Date: 20230911
Lead Channel Impedance Value: 559 Ohm
Lead Channel Impedance Value: 644 Ohm
Lead Channel Setting Pacing Amplitude: 2.5 V
Lead Channel Setting Pacing Amplitude: 2.5 V
Lead Channel Setting Pacing Pulse Width: 0.4 ms
Lead Channel Setting Sensing Sensitivity: 2.5 mV
Pulse Gen Serial Number: 689048
Zone Setting Status: 755011

## 2024-01-22 ENCOUNTER — Ambulatory Visit (HOSPITAL_COMMUNITY): Attending: Cardiology

## 2024-01-22 ENCOUNTER — Encounter: Payer: Self-pay | Admitting: Cardiology

## 2024-01-22 ENCOUNTER — Ambulatory Visit (HOSPITAL_COMMUNITY): Admitting: Cardiology

## 2024-01-22 ENCOUNTER — Ambulatory Visit: Admitting: Cardiology

## 2024-01-22 VITALS — BP 130/64 | HR 72 | Resp 16 | Ht 67.0 in | Wt 147.6 lb

## 2024-01-22 DIAGNOSIS — I5020 Unspecified systolic (congestive) heart failure: Secondary | ICD-10-CM

## 2024-01-22 LAB — ECHOCARDIOGRAM COMPLETE
Area-P 1/2: 2.65 cm2
Height: 67 in
P 1/2 time: 425 ms
S' Lateral: 4.15 cm
Weight: 2361.6 [oz_av]

## 2024-01-22 MED ORDER — METOPROLOL SUCCINATE ER 25 MG PO TB24: 25.0000 mg | ORAL_TABLET | Freq: Every day | ORAL | 3 refills | Status: AC

## 2024-01-22 NOTE — Patient Instructions (Signed)
 Medication Instructions:  DECREASE Metoprolol to 25 mg daily   *If you need a refill on your cardiac medications before your next appointment, please call your pharmacy*  Follow-Up: At [facility], you and your health needs are our priority.  As part of our continuing mission to provide you with exceptional heart care, our providers are all part of one team.  This team includes your primary Cardiologist (physician) and Advanced Practice Providers or APPs (Physician Assistants and Nurse Practitioners) who all work together to provide you with the care you need, when you need it.  Your next appointment:   3 month(s)  Provider:   Jari Favre, PA-C, Ronie Spies, PA-C, Robin Searing, NP, Jacolyn Reedy, PA-C, Eligha Bridegroom, NP, Tereso Newcomer, PA-C, or Perlie Gold, PA-C      We recommend signing up for the patient portal called "MyChart".  Sign up information is provided on this After Visit Summary.  MyChart is used to connect with patients for Virtual Visits (Telemedicine).  Patients are able to view lab/test results, encounter notes, upcoming appointments, etc.  Non-urgent messages can be sent to your provider as well.   To learn more about what you can do with MyChart, go to [email or web address].   Other Instructions       1st Floor: - Lobby - Registration  - Pharmacy  - Lab - Cafe  2nd Floor: - PV Lab - Diagnostic Testing (echo, CT, nuclear med)  3rd Floor: - Vacant  4th Floor: - TCTS (cardiothoracic surgery) - AFib Clinic - Structural Heart Clinic - Vascular Surgery  - Vascular Ultrasound  5th Floor: - HeartCare Cardiology (general and EP) - Clinical Pharmacy for coumadin, hypertension, lipid, weight-loss medications, and med management appointments    Valet parking services will be available as well.

## 2024-01-22 NOTE — Progress Notes (Signed)
 Heart function only mildly reduced. Continue medications as discussed earlier today during office visit.  Thanks MJP

## 2024-01-22 NOTE — Progress Notes (Signed)
 Cardiology Office Note:  .   Date:  01/22/2024  ID:  Brandon Robles, DOB September 29, 1926, MRN [id number] PCP: Lorenda Ishihara, MD  [facility] Providers Cardiologist:  Truett Mainland, MD PCP: Lorenda Ishihara, MD   No chief complaint on file.     History of Present Illness: .    Brandon Robles is a 89 y.o. male with hypertension, hyperlipidemia, CAD w/ prior PCI's, h/o pericarditis, sinus node dysfunction, CHB now s/p [employer] dual chamber PPM, paroxysmal Afib/flutter, HFrEF-w/recovered LVEF   Patient's breathing is improved.  Negative has resolved.  He states that he feels his heart rate goes down to 50s in the evening and is concerned about it..  Reviewed lab results from 01/15/2024, details below.   Vitals:   01/22/24 1402  BP: 130/64  Pulse: 72  Resp: 16  SpO2: 95%       ROS:  Review of Systems  Cardiovascular:  Negative for chest pain, dyspnea on exertion, leg swelling, palpitations and syncope.     Studies Reviewed: [name]    EKG 01/01/2024: Ventricular-paced rhythm When compared with ECG of 17-Aug-2021 06:53, Electronic ventricular pacemaker has replaced Atrial fibrillation    Independently interpreted 01/15/2024: Cr 1.44, K 5.3 ProBNP 390  01/04/2024: Hb 12.8 Cr 1.3 Trop I 0.046, 0.045 ProBNP 1350  12/2023: Hb 14.1 Cr 1.4, eGFR 46, Na/K 135/3.8 ProBNP 875  External echocardiogram 06/2022 [facility]): LVEF 55%.  Mid anterior, mid anteroseptal hypokinesis. Mild biatrial dilatation. Moderate aortic regurgitation.    Independently interpreted Echocardiogram 07/2021: Distal LAD territory hypokinesis/akinesis LVEF 30-35% Mildly dilated RV, normal RV systolic function.. Estimated RVSP 63 mmHg Mod biatrial dilatation Mod MR Mod to severe TR Aortic sclerosis   Risk Assessment/Calculations:    CHA2DS2-VASc Score = 4  This indicates a 4.8% annual risk of stroke. The patient's score is based upon: CHF History: 1 HTN History:  1 Diabetes History: 0 Stroke History: 0 Vascular Disease History: 0 Age Score: 2 Gender Score: 0     Physical Exam:   Physical Exam Vitals and nursing note reviewed.  Constitutional:      General: He is not in acute distress. Neck:     Vascular: No JVD.  Cardiovascular:     Rate and Rhythm: Normal rate and regular rhythm.     Heart sounds: Normal heart sounds. No murmur heard. Pulmonary:     Effort: Pulmonary effort is normal.     Breath sounds: Normal breath sounds. No wheezing or rales.  Musculoskeletal:     Right lower leg: No edema.     Left lower leg: No edema.      VISIT DIAGNOSES:   ICD-10-CM   1. HFrEF (heart failure with reduced ejection fraction) (HCC)  I50.20          ASSESSMENT AND PLAN: .    Brandon Robles is a 89 y.o. male with hypertension, hyperlipidemia, CAD w/ prior PCI's, h/o pericarditis, sinus node dysfunction, CHB now s/p [employer] dual chamber PPM, paroxysmal Afib/flutter, HFrEF-w/recovered LVEF   HFrEF: EF 30 to 35% in the past, 50-55% on most recent echocardiogram in 2023. Recent heart failure exacerbation ER visits, now euvolemic after further adjustments in GDMT. proBNP normalized on 01/15/2024. Continue Entresto 24/26 mg twice daily, Jardiance 10 mg daily, Lasix 20 mg daily. Reduce metoprolol succinate from 50 mg daily to 25 mg daily to avoid bradycardia-year-old patient. Upcoming echocardiogram later this afternoon.  PAF: Continue d Eliquis 5 mg twice daily.     No orders  of the defined types were placed in this encounter.   Follow-up with Pharm.D. in May. F/u in 3 months w/me  Signed, Elder Negus, MD

## 2024-01-23 ENCOUNTER — Other Ambulatory Visit: Payer: Self-pay

## 2024-01-23 DIAGNOSIS — I10 Essential (primary) hypertension: Secondary | ICD-10-CM

## 2024-01-23 DIAGNOSIS — I480 Paroxysmal atrial fibrillation: Secondary | ICD-10-CM

## 2024-01-23 MED ORDER — ENTRESTO 24-26 MG PO TABS: 1.0000 | ORAL_TABLET | Freq: Two times a day (BID) | ORAL | 0 refills | Status: DC

## 2024-01-23 MED ORDER — APIXABAN 5 MG PO TABS: 5.0000 mg | ORAL_TABLET | Freq: Two times a day (BID) | ORAL | 1 refills | Status: DC

## 2024-01-23 NOTE — Telephone Encounter (Signed)
 Eliquis 5mg  refill request received. Patient is 89 years old, weight-67kg, Crea-1.44  on 01/15/24, Diagnosis-Afib/flutter, and last seen by Dr. Rosemary Holms on 01/22/24. Dose is appropriate based on dosing criteria. Will send in refill to requested pharmacy.

## 2024-01-23 NOTE — Telephone Encounter (Signed)
 Pt's pharmacy [facility] pharmacy is requesting a 90 day supply on medication Eliquis 5 mg tablets. Please address

## 2024-01-29 ENCOUNTER — Other Ambulatory Visit: Payer: Self-pay | Admitting: Internal Medicine

## 2024-01-29 DIAGNOSIS — I10 Essential (primary) hypertension: Secondary | ICD-10-CM

## 2024-02-12 DIAGNOSIS — N1832 Chronic kidney disease, stage 3b: Secondary | ICD-10-CM | POA: Diagnosis not present

## 2024-02-12 DIAGNOSIS — E7800 Pure hypercholesterolemia, unspecified: Secondary | ICD-10-CM | POA: Diagnosis not present

## 2024-02-12 DIAGNOSIS — I10 Essential (primary) hypertension: Secondary | ICD-10-CM | POA: Diagnosis not present

## 2024-02-12 DIAGNOSIS — I129 Hypertensive chronic kidney disease with stage 1 through stage 4 chronic kidney disease, or unspecified chronic kidney disease: Secondary | ICD-10-CM | POA: Diagnosis not present

## 2024-02-12 DIAGNOSIS — G4700 Insomnia, unspecified: Secondary | ICD-10-CM | POA: Diagnosis not present

## 2024-02-13 ENCOUNTER — Other Ambulatory Visit: Payer: Self-pay

## 2024-02-13 MED ORDER — FUROSEMIDE 20 MG PO TABS: 20.0000 mg | ORAL_TABLET | Freq: Every day | ORAL | 3 refills | Status: AC

## 2024-02-26 NOTE — Progress Notes (Unsigned)
 Patient ID: ROEN TEBEAU                 DOB: 07-15-1926                      MRN: [id number]     HPI: Brandon Robles is a 89 y.o. male referred by Dr. Filiberto Hug to pharmacy clinic for HF medication management. PMH is significant for Dr.Patwardhan . Most recent LVEF 50-55%in 2023.  EF 30 to 35% in the past, 50-55% on most recent echocardiogram in 2023. Recent heart failure exacerbation ER visits,  euvolemic after further adjustments in GDMT.proBNP normalized on 01/15/2024. At the last visit with Dr.Patwardhan continued Entresto  24/26 mg twice daily, Jardiance  10 mg daily, Lasix  20 mg daily spironolactone   25 mg daily. Reduce metoprolol  succinate from 50 mg daily to 25 mg daily to avoid bradycardia Patient presented today for HF meds optimization. Patient forgot to wear his hearing aid he was having difficulty understanding me. Reports he checks BP at home once in awhile and it averages about 111-120/ 70 heart rate 60 ( recalls from memory) can't go with out furosemide  he starts to have SOB when he dose not take it.  Symptomatically, he is feeling dizziness, lightheadedness, and fatigue after taking Entresto  . Denies chest pain or palpitations. Feels SOB when he stop taking furosemide . Able to complete all ADLs. Activity level normal. Does not check weight at home denies  LEE, PND, or orthopnea. Appetite has been normal . He  adheres to a low-salt diet. Ran out of refills on Entresto  so sent prescription with 11 refills.       Current CHF meds:  Entresto  24/26 mg twice daily, Jardiance  10 mg daily, Lasix  20 mg daily spironolactone   25 mg daily, metoprolol  succinate 25 mg daily  Previously tried: metoprolol  succinate 50 mg daily -bradycardia  Adherence Assessment  Do you ever forget to take your medication? [] Yes [x] No  Do you ever skip doses due to side effects? [] Yes [x] No  Do you have trouble affording your medicines? [] Yes [x] No  Are you ever unable to pick up your medication due to  transportation difficulties? [] Yes [x] No  Do you ever stop taking your medications because you don't believe they are helping? [] Yes [x] No  Do you check your weight daily?  [] Yes [x] No   Adherence strategy: use pill dispenser   Barriers to obtaining medications: none   BP goal: <130/80    Social History:  Alcohol: none  Smoking: none   Diet: low salt diet   Exercise: none, but stays active around the house, live by himself with 2 dogs   Home BP readings: ~110-120/54-59 heart rate low 50's resting and ~65 while active   Wt Readings from Last 3 Encounters:  01/22/24 147 lb 9.6 oz (67 kg)  01/08/24 147 lb 9.6 oz (67 kg)  01/01/24 150 lb 6.4 oz (68.2 kg)   BP Readings from Last 3 Encounters:  02/28/24 (!) 129/56  01/22/24 130/64  01/08/24 126/60   Pulse Readings from Last 3 Encounters:  02/28/24 70  01/22/24 72  01/08/24 77    Renal function: CrCl cannot be calculated (Patient's most recent lab result is older than the maximum 21 days allowed.).  Past Medical History:  Diagnosis Date   Blastocystis hominis    Bradycardia 07/18/2013   CAD (coronary artery disease)    S/P inferior MI 07/2000 with RCA stenting at that time. In 03/2006 he had a LAD non-drug-eluting stent implanted.  Chronic insomnia    Complex renal cyst    Left   Diverticulitis    ED (erectile dysfunction)    Vacuum pump   Essential hypertension, benign    Stable, on lisinopril -HCTZ & Atenolol.   GERD (gastroesophageal reflux disease)    H/O pericarditis    Hemorrhoids    LBBB (left bundle branch block)    Multiple facial fractures (HCC) 02/25/2016   Multiple rib fractures 09/25/10   PTX 09/2010   Nephrolithiasis    Renal stone basketing.   Pure hypercholesterolemia    On Red Yeast Rice & CoQ 10.   Sinoatrial node dysfunction (HCC)    EC Holter 24hr 06/24/13 showed sinus bradycardia with probable chronotropic incompetence. May need pacemaker per Dr. Kay Parson.   Skin cancer    Left base  (Face) & (Ear in 12/2011).    Current Outpatient Medications on File Prior to Visit  Medication Sig Dispense Refill   acetaminophen  (TYLENOL ) 325 MG tablet Take 2 tablets (650 mg total) by mouth every 4 (four) hours as needed for mild pain.     amoxicillin (AMOXIL) 500 MG capsule Take 4 capsules by mouth as needed 1 hour prior to dental procedures.  0   apixaban  (ELIQUIS ) 5 MG TABS tablet Take 1 tablet (5 mg total) by mouth 2 (two) times daily. 180 tablet 1   co-enzyme Q-10 30 MG capsule Take 30 mg by mouth daily.     empagliflozin  (JARDIANCE ) 10 MG TABS tablet Take 1 tablet (10 mg total) by mouth daily before breakfast. 90 tablet 3   furosemide  (LASIX ) 20 MG tablet Take 1 tablet (20 mg total) by mouth daily. 90 tablet 3   metoprolol  succinate (TOPROL -XL) 25 MG 24 hr tablet Take 1 tablet (25 mg total) by mouth daily. 90 tablet 3   nitroGLYCERIN  (NITROSTAT ) 0.4 MG SL tablet Place 0.4 mg under the tongue every 5 (five) minutes as needed for chest pain. (MAXIMUM of 3 TABLETS)     OVER THE COUNTER MEDICATION Take 2 capsules by mouth daily. OTC [place] capsules     OVER THE COUNTER MEDICATION Take 1 capsule by mouth daily. OMEGA Q PLUS     phenylephrine (SUDAFED PE) 10 MG TABS tablet Take 10 mg by mouth every 6 (six) hours as needed (nasal congestion).     Red Yeast Rice 600 MG CAPS Take 600 mg by mouth daily.     sodium chloride  (OCEAN) 0.65 % SOLN nasal spray Place 1 spray into both nostrils daily as needed for congestion.     spironolactone  (ALDACTONE ) 25 MG tablet Take 1 tablet (25 mg total) by mouth daily. 90 tablet 3   temazepam  (RESTORIL ) 30 MG capsule Take 30 mg by mouth at bedtime.     Throat Lozenges [facility] FRIEND MT) Use as directed 1 lozenge in the mouth or throat 2 (two) times daily as needed (congestion).     No current facility-administered medications on file prior to visit.    Allergies  Allergen Reactions   Other Other (See Comments)    "pins and needles feeling"    Vitamin D Analogs Other (See Comments)    "pins and needles feeling"   Zolpidem Other (See Comments)    Confusion, hallucinations   Fish Oil Other (See Comments)   Ambien [Zolpidem Tartrate] Other (See Comments)    Confusion   Honey Other (See Comments)    "pins and needles feeling"     Assessment/Plan:  1. CHF -  HFrEF (heart failure  with reduced ejection fraction) (HCC) Assessment: BP in office BP 129/56 mmHg heart rate 70 (goal <130/80) Takes and tolerates current GDMT well without any side effects Denies SOB, palpitation, chest pain, headaches,or swelling Need to take furosemide  every day to avoid lung water retention and SOB  Gets dizzy after taking Entresto  in the morning and evening so he avoid driving right after taking Entresto   Home BP 111-120/ 70 heart rate 60 ( recalls from memory  Follows low salt diet and stays active around the house   Plan:  Given soft BP no medication changes   Continue taking ntresto 24/26 mg twice daily, Jardiance  10 mg daily, Lasix  20 mg daily spironolactone   25 mg daily, metoprolol  succinate 25 mg daily Patient to keep record of BP readings with heart rate and report to us  at the next visit Patient to bring all BP and BP monitor at next visit for validation  Patient to see PharmD in 6-8 weeks for follow up  Follow up lab(s): none    Thank you   Nickola Baron, Pharm.D [facility]eral[facility] Mom. [facility] [address], [place], [place] [address] Phone: [phone]; Fax: [phone]

## 2024-02-28 ENCOUNTER — Encounter: Payer: Self-pay | Admitting: Pharmacist

## 2024-02-28 ENCOUNTER — Ambulatory Visit: Attending: Cardiology | Admitting: Pharmacist

## 2024-02-28 VITALS — BP 129/56 | HR 70

## 2024-02-28 DIAGNOSIS — I5020 Unspecified systolic (congestive) heart failure: Secondary | ICD-10-CM | POA: Diagnosis not present

## 2024-02-28 MED ORDER — ENTRESTO 24-26 MG PO TABS: 1.0000 | ORAL_TABLET | Freq: Two times a day (BID) | ORAL | 11 refills | Status: DC

## 2024-02-28 NOTE — Progress Notes (Signed)
 Remote pacemaker transmission.

## 2024-02-28 NOTE — Assessment & Plan Note (Addendum)
 Assessment: BP in office BP 129/56 mmHg heart rate 70 (goal <130/80) Takes and tolerates current GDMT well without any side effects Denies SOB, palpitation, chest pain, headaches,or swelling Need to take furosemide  every day to avoid lung water retention and SOB  Gets dizzy after taking Entresto  in the morning and evening so he avoid driving right after taking Entresto   Home BP 111-120/ 70 heart rate 60 ( recalls from memory  Follows low salt diet and stays active around the house   Plan:  Given labile BP no medication changes   Continue taking ntresto 24/26 mg twice daily, Jardiance  10 mg daily, Lasix  20 mg daily spironolactone   25 mg daily, metoprolol  succinate 25 mg daily Patient to keep record of BP readings with heart rate and report to us  at the next visit Patient to bring all BP and BP monitor at next visit for validation  Patient to see PharmD in 6-8 weeks for follow up  Follow up lab(s): none

## 2024-02-28 NOTE — Patient Instructions (Signed)
 No Changes made by your pharmacist Nickola Baron, PharmD at today's visit:   Bring all of your meds, your BP cuff and your record of home blood pressures to your next appointment.    HOW TO TAKE YOUR BLOOD PRESSURE AT HOME  Rest 5 minutes before taking your blood pressure.  Don't smoke or drink caffeinated beverages for at least 30 minutes before. Take your blood pressure before (not after) you eat. Sit comfortably with your back supported and both feet on the floor (don't cross your legs). Elevate your arm to heart level on a table or a desk. Use the proper sized cuff. It should fit smoothly and snugly around your bare upper arm. There should be enough room to slip a fingertip under the cuff. The bottom edge of the cuff should be 1 inch above the crease of the elbow. Ideally, take 3 measurements at one sitting and record the average.  Important lifestyle changes to control high blood pressure  Intervention  Effect on the BP  Lose extra pounds and watch your waistline Weight loss is one of the most effective lifestyle changes for controlling blood pressure. If you're overweight or obese, losing even a small amount of weight can help reduce blood pressure. Blood pressure might go down by about 1 millimeter of mercury (mm Hg) with each kilogram (about 2.2 pounds) of weight lost.  Exercise regularly As a general goal, aim for at least 30 minutes of moderate physical activity every day. Regular physical activity can lower high blood pressure by about 5 to 8 mm Hg.  Eat a healthy diet Eating a diet rich in whole grains, fruits, vegetables, and low-fat dairy products and low in saturated fat and cholesterol. A healthy diet can lower high blood pressure by up to 11 mm Hg.  Reduce salt (sodium) in your diet Even a small reduction of sodium in the diet can improve heart health and reduce high blood pressure by about 5 to 6 mm Hg.  Limit alcohol One drink equals 12 ounces of beer, 5 ounces of wine,  or 1.5 ounces of 80-proof liquor.  Limiting alcohol to less than one drink a day for women or two drinks a day for men can help lower blood pressure by about 4 mm Hg.   If you have any questions or concerns please use My Chart to send questions or call the office at [phone]

## 2024-03-01 ENCOUNTER — Telehealth: Payer: Self-pay | Admitting: Cardiology

## 2024-03-01 MED ORDER — ENTRESTO 24-26 MG PO TABS: 1.0000 | ORAL_TABLET | Freq: Two times a day (BID) | ORAL | 3 refills | Status: AC

## 2024-03-01 NOTE — Telephone Encounter (Signed)
*  STAT* If patient is at the pharmacy, call can be transferred to refill team.   1. Which medications need to be refilled? (please list name of each medication and dose if known)   sacubitril-valsartan (ENTRESTO ) 24-26 MG     DIFFERENT PHARMACY   4. Which pharmacy/location (including street and city if local pharmacy) is medication to be sent to?  [facility] - [place], [place] - [address] Phone: [phone]  Fax: [phone]       5. Do they need a 30 day or 90 day supply? 90

## 2024-03-01 NOTE — Telephone Encounter (Signed)
 Pt's medication was sent to pt's pharmacy as requested. Confirmation received.

## 2024-03-20 DIAGNOSIS — K080 Exfoliation of teeth due to systemic causes: Secondary | ICD-10-CM | POA: Diagnosis not present

## 2024-04-05 ENCOUNTER — Other Ambulatory Visit: Payer: Self-pay

## 2024-04-05 DIAGNOSIS — I480 Paroxysmal atrial fibrillation: Secondary | ICD-10-CM

## 2024-04-05 MED ORDER — APIXABAN 5 MG PO TABS: 5.0000 mg | ORAL_TABLET | Freq: Two times a day (BID) | ORAL | 1 refills | Status: DC

## 2024-04-10 ENCOUNTER — Ambulatory Visit: Attending: Physician Assistant | Admitting: Physician Assistant

## 2024-04-10 ENCOUNTER — Encounter: Payer: Self-pay | Admitting: Physician Assistant

## 2024-04-10 VITALS — BP 122/50 | HR 60 | Ht 67.0 in | Wt 150.6 lb

## 2024-04-10 DIAGNOSIS — I480 Paroxysmal atrial fibrillation: Secondary | ICD-10-CM

## 2024-04-10 DIAGNOSIS — N1831 Chronic kidney disease, stage 3a: Secondary | ICD-10-CM | POA: Diagnosis not present

## 2024-04-10 DIAGNOSIS — I2510 Atherosclerotic heart disease of native coronary artery without angina pectoris: Secondary | ICD-10-CM

## 2024-04-10 DIAGNOSIS — I5020 Unspecified systolic (congestive) heart failure: Secondary | ICD-10-CM

## 2024-04-10 DIAGNOSIS — E782 Mixed hyperlipidemia: Secondary | ICD-10-CM

## 2024-04-10 DIAGNOSIS — I10 Essential (primary) hypertension: Secondary | ICD-10-CM

## 2024-04-10 NOTE — Assessment & Plan Note (Signed)
 Hyperlipidemia with LDL at optimal level. He has declined statin therapy in the past and continues to take red yeast rice 600 mg daily. - Continue red yeast rice 600 mg daily

## 2024-04-10 NOTE — Assessment & Plan Note (Signed)
 He is tolerating anticoagulation. Wt is 68.3 kg. Check BMET today. If creatinine increases to >/= 1.5, will need to decrease Eliquis  to 2.5 mg twice daily.

## 2024-04-10 NOTE — Assessment & Plan Note (Signed)
-   Obtain follow-up BMP today - Consider decreasing spironolactone  if potassium is above 5 or GFR is less than 30

## 2024-04-10 NOTE — Assessment & Plan Note (Signed)
 Ejection fraction improved from 30-35 to 55% in September 2023 but decreased again to 40-45% in April 2025.  He is currently on optimal heart failure medications including Entresto , Jardiance , metoprolol , and spironolactone . He reports visual disturbances potentially related to Entresto , but no dizziness or hypotension. His symptoms may also be due to underlying eye disease. We discussed remaining Entresto  vs changing to an ARB (Valsartan). He prefers to continue Entresto  despite visual changes.   - Continue Entresto  24/26 mg twice daily - Continue Jardiance  10 mg daily - Continue Lasix  20 mg daily - Continue metoprolol  succinate 25 mg daily - Continue spironolactone  25 mg daily - Obtain basic metabolic panel (BMP) today - Consider switching Entresto  to valsartan 40 mg twice daily if visual disturbances worsen - Follow up with me in 3 mos. Ok to cancel PharmD appt next week.

## 2024-04-10 NOTE — Assessment & Plan Note (Signed)
 Blood pressure is well-controlled on current medication regimen.

## 2024-04-10 NOTE — Patient Instructions (Addendum)
 Medication Instructions:  .Your physician recommends that you continue on your current medications as directed. Please refer to the Current Medication list given to you today.  *If you need a refill on your cardiac medications before your next appointment, please call your pharmacy*  Lab Work: TODAY: BMET If you have labs (blood work) drawn today and your tests are completely normal, you will receive your results only by: MyChart Message (if you have MyChart) OR A paper copy in the mail If you have any lab test that is abnormal or we need to change your treatment, we will call you to review the results.  Testing/Procedures: None ordered  Follow-Up: At [facility], you and your health needs are our priority.  As part of our continuing mission to provide you with exceptional heart care, our providers are all part of one team.  This team includes your primary Cardiologist (physician) and Advanced Practice Providers or APPs (Physician Assistants and Nurse Practitioners) who all work together to provide you with the care you need, when you need it.  Your next appointment:   3 month(s)  Provider:   Glendia Ferrier, PA-C    Your physician recommends that you schedule a follow-up appointment with Dr. Waddell next available   We recommend signing up for the patient portal called MyChart.  Sign up information is provided on this After Visit Summary.  MyChart is used to connect with patients for Virtual Visits (Telemedicine).  Patients are able to view lab/test results, encounter notes, upcoming appointments, etc.  Non-urgent messages can be sent to your provider as well.   To learn more about what you can do with MyChart, go to [email or web address].

## 2024-04-10 NOTE — Progress Notes (Signed)
 OFFICE NOTE:    Date:  04/10/2024  ID:  Brandon Robles, DOB 05-10-1926, MRN [id number] PCP: Brandon Charm, MD  [facility] Providers Cardiologist:  Brandon JINNY Lawrence, MD       Patient Profile:  Coronary artery disease Inferior STEMI s/p BMS to RCA in 2001 S/p BMS to LAD in 2007 LHC 04/13/2006: Proximal LAD 90% (PCI), mid LAD 50-60%, RCA proximal 50 prior to previous stent, 40 beyond stent, stent patent HFmrEF (heart failure with mildly reduced ejection fraction)  TTE in 2023 w apparent recovery of LVF >> reduced again by TTE in 01/2024 TTE 07/26/2021: EF 30-35, moderate MR, mild AI, moderate-severe TR TTE 06/26/22 [facility]): EF 55, mod AI, mild TR, trivial MR  TTE 01/22/2024: EF 40-45, global HK, normal RVSF, mild LAE, mild-moderate MR, mild-moderate TR, moderate AI, AV sclerosis Atrial fibrillation/flutter  Hx of pericarditis  Complete heart block S/p Pacemaker 06/2022 Left Bundle Branch Block  Chronic kidney disease  Hypertension Hyperlipidemia       Discussed the use of AI scribe software for clinical note transcription with the patient, who gave verbal consent to proceed. History of Present Illness Brandon Robles is a 89 y.o. male who returns for follow up of CHF. He is a prior pt of Dr. Sharps. He is now followed by Dr. Lawrence. He had a recent trip to the ED in March with volume overload. A prior echocardiogram at [facility] had demonstrated recovery of LVF. Repeat echocardiogram here in 01/2024 showed his EF is down again at 40-45. He was seen by the PharmD clinic in 02/2024. No med changes were made.  He is here alone. His breathing is stable. He still notes shortness of breath with walking long distances. He has not had chest discomfort unless lifting heavy objects. This is related prior rib fracture requiring surgery. He has not had substernal chest pain. He has not had dizziness or syncope. He has not had leg swelling. He can climb stairs at home slowly.       Review of Systems  Gastrointestinal:  Negative for hematochezia and melena.  Genitourinary:  Negative for hematuria.  -See HPI     Studies Reviewed:        Results LABS BMP: K 5.3, Cr 1.44, GFR 45 (01/15/2024) Lipid panel: HDL 48, LDL 63, triglycerides 52 (95/71/7974)  Risk Assessment/Calculations:  CHA2DS2-VASc Score = 4   This indicates a 4.8% annual risk of stroke. The patient's score is based upon: CHF History: 1 HTN History: 1 Diabetes History: 0 Stroke History: 0 Vascular Disease History: 0 Age Score: 2 Gender Score: 0           Physical Exam:  VS:  BP (!) 122/50   Pulse 60   Ht 5' 7 (1.702 m)   Wt 150 lb 9.6 oz (68.3 kg)   SpO2 97%   BMI 23.59 kg/m        Wt Readings from Last 3 Encounters:  04/10/24 150 lb 9.6 oz (68.3 kg)  01/22/24 147 lb 9.6 oz (67 kg)  01/08/24 147 lb 9.6 oz (67 kg)    Constitutional:      Appearance: Healthy appearance. Not in distress.  Neck:     Vascular: JVD normal.  Pulmonary:     Breath sounds: Normal breath sounds. No wheezing. No rales.  Cardiovascular:     Normal rate. Regular rhythm.     Murmurs: There is no murmur.  Edema:    Peripheral edema absent.  Abdominal:     Palpations: Abdomen is soft.        Assessment and Plan:    Assessment & Plan Heart failure with mildly reduced ejection fraction (HFmrEF, 41-49%) (HCC) Ejection fraction improved from 30-35 to 55% in September 2023 but decreased again to 40-45% in April 2025.  He is currently on optimal heart failure medications including Entresto , Jardiance , metoprolol , and spironolactone . He reports visual disturbances potentially related to Entresto , but no dizziness or hypotension. His symptoms may also be due to underlying eye disease. We discussed remaining Entresto  vs changing to an ARB (Valsartan). He prefers to continue Entresto  despite visual changes.   - Continue Entresto  24/26 mg twice daily - Continue Jardiance  10 mg daily - Continue Lasix  20 mg  daily - Continue metoprolol  succinate 25 mg daily - Continue spironolactone  25 mg daily - Obtain basic metabolic panel (BMP) today - Consider switching Entresto  to valsartan 40 mg twice daily if visual disturbances worsen - Follow up with me in 3 mos. Ok to cancel PharmD appt next week.  PAF (paroxysmal atrial fibrillation) (HCC) He is tolerating anticoagulation. Wt is 68.3 kg. Check BMET today. If creatinine increases to >/= 1.5, will need to decrease Eliquis  to 2.5 mg twice daily. Coronary artery disease involving native coronary artery of native heart without angina pectoris Inferior STEMI in 2001 treated with bare metal stent to RCA and subsequent BMS to LAD in 2007. Currently asymptomatic with no anginal symptoms. He is not on ASA as he is on Eliquis . He has declined statin Rx in the past. Continue NTG prn. Stage 3a chronic kidney disease (HCC) - Obtain follow-up BMP today - Consider decreasing spironolactone  if potassium is above 5 or GFR is less than 30 Primary hypertension Blood pressure is well controlled on current medication regimen. Mixed hyperlipidemia Hyperlipidemia with LDL at optimal level. He has declined statin therapy in the past and continues to take red yeast rice 600 mg daily. - Continue red yeast rice 600 mg daily        Dispo:  Return in about 3 months (around 07/11/2024) for Routine Follow Up, w/ Glendia Ferrier, PA-C.  Signed, Glendia Ferrier, PA-C

## 2024-04-10 NOTE — Assessment & Plan Note (Signed)
 Inferior STEMI in 2001 treated with bare metal stent to RCA and subsequent BMS to LAD in 2007. Currently asymptomatic with no anginal symptoms. He is not on ASA as he is on Eliquis . He has declined statin Rx in the past. Continue NTG prn.

## 2024-04-11 ENCOUNTER — Ambulatory Visit: Payer: Self-pay | Admitting: Physician Assistant

## 2024-04-11 DIAGNOSIS — E875 Hyperkalemia: Secondary | ICD-10-CM

## 2024-04-11 DIAGNOSIS — I5020 Unspecified systolic (congestive) heart failure: Secondary | ICD-10-CM

## 2024-04-11 DIAGNOSIS — N1831 Chronic kidney disease, stage 3a: Secondary | ICD-10-CM

## 2024-04-11 LAB — BASIC METABOLIC PANEL WITH GFR
BUN/Creatinine Ratio: 26 — ABNORMAL HIGH (ref 10–24)
BUN: 51 mg/dL — ABNORMAL HIGH (ref 10–36)
CO2: 21 mmol/L (ref 20–29)
Calcium: 9.4 mg/dL (ref 8.6–10.2)
Chloride: 104 mmol/L (ref 96–106)
Creatinine, Ser: 1.93 mg/dL — ABNORMAL HIGH (ref 0.76–1.27)
Glucose: 87 mg/dL (ref 70–99)
Potassium: 5.4 mmol/L — ABNORMAL HIGH (ref 3.5–5.2)
Sodium: 140 mmol/L (ref 134–144)
eGFR: 31 mL/min/{1.73_m2} — ABNORMAL LOW (ref >59)

## 2024-04-15 DIAGNOSIS — D6869 Other thrombophilia: Secondary | ICD-10-CM | POA: Diagnosis not present

## 2024-04-17 ENCOUNTER — Ambulatory Visit: Admitting: Pharmacist

## 2024-04-17 DIAGNOSIS — L821 Other seborrheic keratosis: Secondary | ICD-10-CM | POA: Diagnosis not present

## 2024-04-17 DIAGNOSIS — C44619 Basal cell carcinoma of skin of left upper limb, including shoulder: Secondary | ICD-10-CM | POA: Diagnosis not present

## 2024-04-17 DIAGNOSIS — L570 Actinic keratosis: Secondary | ICD-10-CM | POA: Diagnosis not present

## 2024-04-17 DIAGNOSIS — L814 Other melanin hyperpigmentation: Secondary | ICD-10-CM | POA: Diagnosis not present

## 2024-04-17 DIAGNOSIS — L281 Prurigo nodularis: Secondary | ICD-10-CM | POA: Diagnosis not present

## 2024-04-17 DIAGNOSIS — Z85828 Personal history of other malignant neoplasm of skin: Secondary | ICD-10-CM | POA: Diagnosis not present

## 2024-04-18 ENCOUNTER — Ambulatory Visit (INDEPENDENT_AMBULATORY_CARE_PROVIDER_SITE_OTHER): Payer: Self-pay

## 2024-04-18 DIAGNOSIS — I480 Paroxysmal atrial fibrillation: Secondary | ICD-10-CM

## 2024-04-18 LAB — CUP PACEART REMOTE DEVICE CHECK
Battery Remaining Longevity: 78 mo
Battery Remaining Percentage: 100 %
Brady Statistic RA Percent Paced: 12 %
Brady Statistic RV Percent Paced: 99 %
Date Time Interrogation Session: 20250703012100
Implantable Lead Connection Status: 753985
Implantable Lead Connection Status: 753985
Implantable Lead Implant Date: 20230911
Implantable Lead Implant Date: 20230911
Implantable Lead Location: 753859
Implantable Lead Location: 753860
Implantable Lead Model: 7840
Implantable Lead Model: 7841
Implantable Lead Serial Number: 104690
Implantable Lead Serial Number: 1094065
Implantable Pulse Generator Implant Date: 20230911
Lead Channel Impedance Value: 543 Ohm
Lead Channel Impedance Value: 564 Ohm
Lead Channel Setting Pacing Amplitude: 2.5 V
Lead Channel Setting Pacing Amplitude: 2.5 V
Lead Channel Setting Pacing Pulse Width: 0.4 ms
Lead Channel Setting Sensing Sensitivity: 2.5 mV
Pulse Gen Serial Number: 689048
Zone Setting Status: 755011

## 2024-04-22 ENCOUNTER — Other Ambulatory Visit: Payer: Self-pay | Admitting: Internal Medicine

## 2024-04-24 DIAGNOSIS — E875 Hyperkalemia: Secondary | ICD-10-CM | POA: Diagnosis not present

## 2024-04-24 DIAGNOSIS — N1831 Chronic kidney disease, stage 3a: Secondary | ICD-10-CM | POA: Diagnosis not present

## 2024-04-24 DIAGNOSIS — I5020 Unspecified systolic (congestive) heart failure: Secondary | ICD-10-CM | POA: Diagnosis not present

## 2024-04-25 ENCOUNTER — Ambulatory Visit: Payer: Self-pay | Admitting: Internal Medicine

## 2024-04-25 LAB — BASIC METABOLIC PANEL WITH GFR
BUN/Creatinine Ratio: 25 — ABNORMAL HIGH (ref 10–24)
BUN: 43 mg/dL — ABNORMAL HIGH (ref 10–36)
CO2: 22 mmol/L (ref 20–29)
Calcium: 9.5 mg/dL (ref 8.6–10.2)
Chloride: 105 mmol/L (ref 96–106)
Creatinine, Ser: 1.74 mg/dL — ABNORMAL HIGH (ref 0.76–1.27)
Glucose: 85 mg/dL (ref 70–99)
Potassium: 5 mmol/L (ref 3.5–5.2)
Sodium: 143 mmol/L (ref 134–144)
eGFR: 36 mL/min/1.73 — ABNORMAL LOW (ref >59)

## 2024-06-03 DIAGNOSIS — H26493 Other secondary cataract, bilateral: Secondary | ICD-10-CM | POA: Diagnosis not present

## 2024-06-03 DIAGNOSIS — H353131 Nonexudative age-related macular degeneration, bilateral, early dry stage: Secondary | ICD-10-CM | POA: Diagnosis not present

## 2024-06-03 DIAGNOSIS — D3132 Benign neoplasm of left choroid: Secondary | ICD-10-CM | POA: Diagnosis not present

## 2024-07-12 ENCOUNTER — Encounter: Payer: Self-pay | Admitting: *Deleted

## 2024-07-15 ENCOUNTER — Encounter: Payer: Self-pay | Admitting: *Deleted

## 2024-07-15 DIAGNOSIS — Z0000 Encounter for general adult medical examination without abnormal findings: Secondary | ICD-10-CM | POA: Diagnosis not present

## 2024-07-15 DIAGNOSIS — Z23 Encounter for immunization: Secondary | ICD-10-CM | POA: Diagnosis not present

## 2024-07-15 NOTE — Progress Notes (Unsigned)
 OFFICE NOTE:    Date:  07/16/2024  ID:  Brandon Robles, DOB 29-May-1926, MRN [id number] PCP: Elliot Charm, MD  [facility] Providers Cardiologist:  Newman JINNY Lawrence, MD        Coronary artery disease Inferior STEMI s/p BMS to RCA in 2001 S/p BMS to LAD in 2007 LHC 04/13/2006: Proximal LAD 90% (PCI), mid LAD 50-60%, RCA proximal 50 prior to previous stent, 40 beyond stent, stent patent HFmrEF (heart failure with mildly reduced ejection fraction)  TTE in 2023 w apparent recovery of LVF >> reduced again by TTE in 01/2024 TTE 07/26/2021: EF 30-35, moderate MR, mild AI, moderate-severe TR TTE 06/26/22 [facility]): EF 55, mod AI, mild TR, trivial MR  TTE 01/22/2024: EF 40-45, global HK, normal RVSF, mild LAE, mild-moderate MR, mild-moderate TR, moderate AI, AV sclerosis Atrial fibrillation/flutter  Hx of pericarditis  Complete heart block S/p Pacemaker 06/2022 Left Bundle Branch Block  Chronic kidney disease  Hypertension Hyperlipidemia       Discussed the use of AI scribe software for clinical note transcription with the patient, who declined to proceed. History of Present Illness Brandon Robles is a 89 y.o. male who returns for follow up of CAD, CHF, AFib. He was last seen in 03/2024. He is a prior pt of Dr. Sharps. Echocardiogram in 2023 had shown recovery of LVF. He was seen in the ED in 12/2023 with volume overload and EF was down at 40-45. At last visit in 03/2024, he was stable. He noted possible visual changes with Entresto  vs ophthalmologic disease. We opted to keep him on Entresto  at that time. Follow up labs showed worsening SCr and his K+ was high. I stopped his Spironolactone .  He is here alone.  He has not had significant shortness of breath.  He describes NYHA II symptoms.  He has not had orthopnea, leg edema, syncope, chest discomfort.    ROS-See HPI    Studies Reviewed:      04/10/24: K 5.4, SCr 1.93 04/24/24: K 5, SCr 1.74   Risk  Assessment/Calculations: CHA2DS2-VASc Score = 4   This indicates a 4.8% annual risk of stroke. The patient's score is based upon: CHF History: 1 HTN History: 1 Diabetes History: 0 Stroke History: 0 Vascular Disease History: 0 Age Score: 2 Gender Score: 0           Physical Exam:  VS:  BP (!) 123/50   Pulse (!) 56   Ht 5' 7 (1.702 m)   Wt 151 lb 14.4 oz (68.9 kg)   SpO2 96%   BMI 23.79 kg/m        Wt Readings from Last 3 Encounters:  07/16/24 151 lb 14.4 oz (68.9 kg)  04/10/24 150 lb 9.6 oz (68.3 kg)  01/22/24 147 lb 9.6 oz (67 kg)    Constitutional:      Appearance: Healthy appearance. Not in distress.  Neck:     Vascular: JVD normal.  Pulmonary:     Breath sounds: Normal breath sounds. No wheezing. No rales.  Cardiovascular:     Normal rate. Regular rhythm.     Murmurs: There is no murmur.  Edema:    Peripheral edema absent.  Abdominal:     Palpations: Abdomen is soft.       Assessment and Plan:    Assessment & Plan Heart failure with mildly reduced ejection fraction (HFmrEF, 41-49%) (HCC) Ejection fraction improved from 30-35 to 55% in September 2023 but decreased again to 40-45% in  April 2025.  After last visit, his labs showed worsening SCr and elevated K+. His spironolactone  was stopped.  Volume status currently stable.  NYHA II.  He notes that he received some communication from his insurance recently about changing his Jardiance .  I tried to put in Farxiga  but the system shows that it is not covered by his insurance.  I have asked him to bring the paperwork with him the next time he comes in. -Continue Jardiance  10 mg daily, Lasix  20 mg daily, metoprolol  succinate 25 mg daily, Entresto  24/26 mg twice daily -Follow-up 6 months PAF (paroxysmal atrial fibrillation) (HCC) Recently, creatinine has been above 1.5.  In March, it was less than 1.5.  Plan to repeat BMET today and determine dosing of Eliquis  based upon these results. -Continue Eliquis  5 mg twice  daily for now -BMET today -If creatinine 1.5 or higher, decrease Eliquis  to 2.5 mg Coronary artery disease involving native coronary artery of native heart without angina pectoris Inferior STEMI in 2001 treated with bare metal stent to RCA and subsequent BMS to LAD in 2007.  No symptoms to suggest angina. -Continue metoprolol  succinate 25 mg daily, nitroglycerin  as needed -He has declined statin therapy in the past Stage 3a chronic kidney disease (HCC) Obtain follow-up BMET today Primary hypertension Blood pressure controlled.   -Continue metoprolol  succinate 25 mg daily, Entresto  24/26 mg twice daily. Mixed hyperlipidemia He has declined statin therapy in the past and has been on red yeast rice.          Dispo:  Return in about 6 months (around 01/13/2025) for Routine Follow Up, w/ Dr. [place].  Signed, Glendia Ferrier, PA-C

## 2024-07-15 NOTE — Assessment & Plan Note (Signed)
 Inferior STEMI in 2001 treated with bare metal stent to RCA and subsequent BMS to LAD in 2007. ***

## 2024-07-15 NOTE — Assessment & Plan Note (Signed)
 Recently, creatinine has been above 1.5.  In March, it was less than 1.5.  Plan to repeat BMET today and determine dosing of Eliquis  based upon these results. -Continue Eliquis  5 mg twice daily for now -BMET today -If creatinine 1.5 or higher, decrease Eliquis  to 2.5 mg

## 2024-07-15 NOTE — Assessment & Plan Note (Signed)
 Ejection fraction improved from 30-35 to 55% in September 2023 but decreased again to 40-45% in April 2025.  After last visit, his labs showed worsening SCr and elevated K+. His spironolactone  was stopped. ***

## 2024-07-15 NOTE — Assessment & Plan Note (Signed)
 He has declined statin therapy in the past and has been on red yeast rice. ***

## 2024-07-16 ENCOUNTER — Encounter: Payer: Self-pay | Admitting: Physician Assistant

## 2024-07-16 ENCOUNTER — Ambulatory Visit: Attending: Physician Assistant | Admitting: Physician Assistant

## 2024-07-16 VITALS — BP 123/50 | HR 56 | Ht 67.0 in | Wt 151.9 lb

## 2024-07-16 DIAGNOSIS — I10 Essential (primary) hypertension: Secondary | ICD-10-CM

## 2024-07-16 DIAGNOSIS — I5020 Unspecified systolic (congestive) heart failure: Secondary | ICD-10-CM

## 2024-07-16 DIAGNOSIS — N1831 Chronic kidney disease, stage 3a: Secondary | ICD-10-CM | POA: Diagnosis not present

## 2024-07-16 DIAGNOSIS — E782 Mixed hyperlipidemia: Secondary | ICD-10-CM

## 2024-07-16 DIAGNOSIS — I480 Paroxysmal atrial fibrillation: Secondary | ICD-10-CM | POA: Diagnosis not present

## 2024-07-16 DIAGNOSIS — I2510 Atherosclerotic heart disease of native coronary artery without angina pectoris: Secondary | ICD-10-CM | POA: Diagnosis not present

## 2024-07-16 NOTE — Assessment & Plan Note (Signed)
Obtain follow-up BMET today. 

## 2024-07-16 NOTE — Patient Instructions (Signed)
 Medication Instructions:  Your physician recommends that you continue on your current medications as directed. Please refer to the Current Medication list given to you today.  *If you need a refill on your cardiac medications before your next appointment, please call your pharmacy*  Lab Work: TODAY:  BMET  If you have labs (blood work) drawn today and your tests are completely normal, you will receive your results only by: MyChart Message (if you have MyChart) OR A paper copy in the mail If you have any lab test that is abnormal or we need to change your treatment, we will call you to review the results.  Testing/Procedures: None ordered  Follow-Up: At [facility], you and your health needs are our priority.  As part of our continuing mission to provide you with exceptional heart care, our providers are all part of one team.  This team includes your primary Cardiologist (physician) and Advanced Practice Providers or APPs (Physician Assistants and Nurse Practitioners) who all work together to provide you with the care you need, when you need it.  Your next appointment:   6 month(s)  Provider:   Newman JINNY Lawrence, MD    We recommend signing up for the patient portal called MyChart.  Sign up information is provided on this After Visit Summary.  MyChart is used to connect with patients for Virtual Visits (Telemedicine).  Patients are able to view lab/test results, encounter notes, upcoming appointments, etc.  Non-urgent messages can be sent to your provider as well.   To learn more about what you can do with MyChart, go to [email or web address].   Other Instructions

## 2024-07-16 NOTE — Assessment & Plan Note (Signed)
 Blood pressure controlled.   -Continue metoprolol  succinate 25 mg daily, Entresto  24/26 mg twice daily.

## 2024-07-17 ENCOUNTER — Telehealth: Payer: Self-pay

## 2024-07-17 ENCOUNTER — Ambulatory Visit: Payer: Self-pay | Admitting: Physician Assistant

## 2024-07-17 DIAGNOSIS — I5020 Unspecified systolic (congestive) heart failure: Secondary | ICD-10-CM

## 2024-07-17 LAB — BASIC METABOLIC PANEL WITH GFR
BUN/Creatinine Ratio: 21 (ref 10–24)
BUN: 33 mg/dL (ref 10–36)
CO2: 23 mmol/L (ref 20–29)
Calcium: 9.5 mg/dL (ref 8.6–10.2)
Chloride: 104 mmol/L (ref 96–106)
Creatinine, Ser: 1.57 mg/dL — ABNORMAL HIGH (ref 0.76–1.27)
Glucose: 102 mg/dL — ABNORMAL HIGH (ref 70–99)
Potassium: 4.9 mmol/L (ref 3.5–5.2)
Sodium: 143 mmol/L (ref 134–144)
eGFR: 40 mL/min/1.73 — ABNORMAL LOW (ref >59)

## 2024-07-18 ENCOUNTER — Ambulatory Visit: Payer: Self-pay

## 2024-07-18 ENCOUNTER — Encounter: Admitting: Internal Medicine

## 2024-07-18 DIAGNOSIS — I480 Paroxysmal atrial fibrillation: Secondary | ICD-10-CM | POA: Diagnosis not present

## 2024-07-18 LAB — CUP PACEART REMOTE DEVICE CHECK
Battery Remaining Longevity: 78 mo
Battery Remaining Percentage: 100 %
Brady Statistic RA Percent Paced: 13 %
Brady Statistic RV Percent Paced: 99 %
Date Time Interrogation Session: 20251002012100
Implantable Lead Connection Status: 753985
Implantable Lead Connection Status: 753985
Implantable Lead Implant Date: 20230911
Implantable Lead Implant Date: 20230911
Implantable Lead Location: 753859
Implantable Lead Location: 753860
Implantable Lead Model: 7840
Implantable Lead Model: 7841
Implantable Lead Serial Number: 104690
Implantable Lead Serial Number: 1094065
Implantable Pulse Generator Implant Date: 20230911
Lead Channel Impedance Value: 534 Ohm
Lead Channel Impedance Value: 577 Ohm
Lead Channel Setting Pacing Amplitude: 2.5 V
Lead Channel Setting Pacing Amplitude: 2.5 V
Lead Channel Setting Pacing Pulse Width: 0.4 ms
Lead Channel Setting Sensing Sensitivity: 2.5 mV
Pulse Gen Serial Number: 689048
Zone Setting Status: 755011

## 2024-07-19 NOTE — Progress Notes (Signed)
 Remote PPM Transmission

## 2024-07-21 ENCOUNTER — Ambulatory Visit: Payer: Self-pay | Admitting: Internal Medicine

## 2024-07-22 ENCOUNTER — Telehealth: Payer: Self-pay | Admitting: *Deleted

## 2024-07-22 ENCOUNTER — Telehealth: Payer: Self-pay | Admitting: Cardiology

## 2024-07-22 MED ORDER — APIXABAN 2.5 MG PO TABS: 2.5000 mg | ORAL_TABLET | Freq: Two times a day (BID) | ORAL | 11 refills | Status: DC

## 2024-07-22 NOTE — Telephone Encounter (Signed)
 Pt c/o medication issue:  1. Name of Medication: apixaban  (ELIQUIS ) 2.5 MG TABS tablet   2. How are you currently taking this medication (dosage and times per day)?    3. Are you having a reaction (difficulty breathing--STAT)? no  4. What is your medication issue? Pharmacy is calling to see if its okay to cut the 5mg  in off because he just got a supply for Eliquis  for 90days. Please advise

## 2024-07-22 NOTE — Telephone Encounter (Signed)
 Please review and advise.

## 2024-07-22 NOTE — Progress Notes (Signed)
 Complex Care Management Note  Care Guide Note 07/22/2024 Name: Brandon Robles MRN: [id number] DOB: 01-Nov-1925  Brandon Robles is a 89 y.o. year old male who sees Elliot Charm, MD for primary care. I reached out to Brandon Robles by phone today to offer complex care management services.  Brandon Robles was given information about Complex Care Management services today including:   The Complex Care Management services include support from the care team which includes your Nurse Care Manager, Clinical Social Worker, or Pharmacist.  The Complex Care Management team is here to help remove barriers to the health concerns and goals most important to you. Complex Care Management services are voluntary, and the patient may decline or stop services at any time by request to their care team member.   Complex Care Management Consent Status: Patient agreed to services and verbal consent obtained.   Follow up plan:  Telephone appointment with complex care management team member scheduled for:  07/25/24  Encounter Outcome:  Patient Scheduled  Harlene Satterfield  [facility]  [facility], [facility] Guide  Direct Dial: [phone]  Fax [phone]

## 2024-07-23 MED ORDER — APIXABAN 2.5 MG PO TABS: 2.5000 mg | ORAL_TABLET | Freq: Two times a day (BID) | ORAL | 1 refills | Status: DC

## 2024-07-23 NOTE — Telephone Encounter (Signed)
 Spoke with [place] Pharmacists and advised per our pharmacist it is not recommended  that pt split his current Eliquis  5mg  tablets to equal the 2.5mg  dose.   Pharmacist requesting a new Rx for Eliquis  2.5mg  be sent as a 90 day prescription.  Rx sent as requested.

## 2024-07-25 ENCOUNTER — Other Ambulatory Visit: Payer: Self-pay

## 2024-07-25 ENCOUNTER — Telehealth: Payer: Self-pay | Admitting: Physician Assistant

## 2024-07-25 NOTE — Patient Instructions (Signed)
 Visit Information  Thank you for taking time to visit with me today. Please don't hesitate to contact me if I can be of assistance to you before our next scheduled appointment.  Our next appointment is by telephone on 08/01/24 at 11 AM Please call the care guide team at [phone] if you need to cancel or reschedule your appointment.   Following is a copy of your care plan:   Goals Addressed             This Visit's Progress    [facility]BCI RN Care Plan   On track    Problems:  Chronic Disease Management support and education needs related to CHF and fall risk  Goal: Over the next 30 days the Patient will demonstrate Ongoing adherence to prescribed treatment plan for CHF as evidenced by patient report of stable weight, and no signs of fluid retention (swelling, shortness of breath) take all medications exactly as prescribed and will call provider for medication related questions as evidenced by patient report of medication compliance    Report no falls as evidenced by patient report  Interventions:   Heart Failure Interventions: Provided education on low sodium diet Reviewed Heart Failure Action Plan in depth and provided written copy Assessed need for readable accurate scales in home Provided education about placing scale on hard, flat surface Advised patient to weigh each morning after emptying bladder Discussed importance of daily weight and advised patient to weigh and record daily Reviewed role of diuretics in prevention of fluid overload and management of heart failure; Discussed the importance of keeping all appointments with provider Screening for signs and symptoms of depression related to chronic disease state  Assessed social determinant of health barriers   Falls Interventions: Provided written and verbal education re: potential causes of falls and Fall prevention strategies Advised patient of importance of notifying provider of falls  Patient Self-Care Activities:   Attend all scheduled provider appointments Call provider office for new concerns or questions  Perform all self care activities independently  Perform IADL's (shopping, preparing meals, housekeeping, managing finances) independently Take medications as prescribed   call office if I gain more than 2 pounds in one day or 5 pounds in one week use salt in moderation watch for swelling in feet, ankles and legs every day weigh myself daily develop a rescue plan follow rescue plan if symptoms flare-up track symptoms and what helps feel better or worse  Plan:  The care management team will reach out to the patient again over the next 7 days. To complete initial assessment             Please call the Suicide and Crisis Lifeline: 988 call 1-800-273-TALK (toll free, 24 hour hotline) if you are experiencing a Mental Health or Behavioral Health Crisis or need someone to talk to.  Call ended prior to confirming patient enrollment in MyChart. MyChart Status listed as active.  Rosaline Finlay, RN MSN [place]  [facility] Population Health RN Care Manager Direct Dial: [phone]  Fax: [phone]  Heart Failure Action Plan A heart failure action plan helps you know what to do when you have symptoms of heart failure. Your action plan is a color-coded plan that lists the symptoms to watch for and indicates what actions to take. If you have symptoms in the green zone, you're doing well. If you have symptoms in the yellow zone, you're having problems. If you have symptoms in the red zone, you need medical care right away. Follow the  plan that was created by you and your health care provider. Review your plan each time you visit your provider. Green zone These signs mean you're doing well and can continue what you're doing: You don't have new or worsening shortness of breath. You have very little swelling or no new swelling. Your weight is stable (no gain or loss). You have a normal  activity level. You don't have chest pain or any other new symptoms. Yellow zone These signs and symptoms mean your condition may be getting worse and you should make some changes: You have trouble breathing when you're active. You have swelling in your feet or legs or have discomfort in your belly. You gain 2-3 lb (0.9-1.4 kg) in 24 hours, or 5 lb (2.3 kg) in a week. This amount may be more or less depending on your condition. You get tired easily. You have trouble sleeping. You have a dry cough. If you have any of these symptoms: Contact your provider within the next day. Your provider may adjust your medicines. Red zone These signs and symptoms mean you should get medical help right away: You have trouble breathing when resting or cannot lie flat and you need to raise your head to help you breathe. You have a dry cough that's getting worse. You have swelling or pain in your feet or legs or discomfort in your belly that's getting worse. You suddenly gain more than 2-3 lb (0.9-1.4 kg) in 24 hours, or more than 5 lb (2.3 kg) in a week. This amount may be more or less depending on your condition. You have trouble staying awake or you feel confused. You don't have an appetite. You have worsening sadness or depression. These symptoms may be an emergency. Call 911 right away. Do not wait to see if the symptoms will go away. Do not drive yourself to the hospital. Follow these instructions at home: Take medicines only as told. Eat a heart-healthy diet. Work with a dietitian to create an eating plan that's best for you. Weigh yourself each day. Your target weight is __________ lb (__________ kg). Call your provider if you gain more than __________ lb (__________ kg) in 24 hours, or more than __________ lb (__________ kg) in a week. Health care provider name: _____________________________________________________ Health care provider phone number:  _____________________________________________________ Where to find more information American Heart Association: heart.org This information is not intended to replace advice given to you by your health care provider. Make sure you discuss any questions you have with your health care provider. Document Revised: 05/18/2023 Document Reviewed: 05/18/2023 Elsevier Patient Education  2024 [employer].

## 2024-07-25 NOTE — Telephone Encounter (Signed)
 Pharmacy called to f/u about Medication Eliquis  originally starting at 5 mg twice a day and then a new order for 2.5 mg twice a day. Please Advise   [facility]  [phone]  Order number [id number]

## 2024-07-25 NOTE — Telephone Encounter (Signed)
 Please advise if pt is supposed to be on Eliquis  2.5 mg or 5 mg twice daily.

## 2024-07-25 NOTE — Progress Notes (Signed)
 Remote PPM Transmission

## 2024-07-25 NOTE — Telephone Encounter (Signed)
 Recent Labs    04/10/24 1440 04/24/24 1052 07/16/24 1612  CREATININE 1.93* 1.74* 1.57*    Patient is > 88 years old and Creatinine has been consistently 1.5 or higher. Therefore, he should be on Eliquis  2.5 mg twice daily. PLAN: - Please make sure pharmacy fills Eliquis  2.5 mg twice daily. Glendia Ferrier, PA-C    07/25/2024 6:02 PM

## 2024-07-25 NOTE — Patient Outreach (Signed)
 Complex Care Management   Visit Note  07/25/2024  Name:  Brandon Robles MRN: [id number] DOB: 1926-02-21  Situation: Referral received for Complex Care Management related to Heart Failure I obtained verbal consent from Patient.  Visit completed with Patient  on the phone. Patient ended call before assessment completed. Will complete full assessment at follow-up visit.  Background:   Past Medical History:  Diagnosis Date   Blastocystis hominis    Bradycardia 07/18/2013   CAD (coronary artery disease)    S/P inferior MI 07/2000 with RCA stenting at that time. In 03/2006 he had a LAD non-drug-eluting stent implanted.   Chronic insomnia    Complex renal cyst    Left   Diverticulitis    ED (erectile dysfunction)    Vacuum pump   Essential hypertension, benign    Stable, on lisinopril -HCTZ & Atenolol.   GERD (gastroesophageal reflux disease)    H/O pericarditis    Hemorrhoids    LBBB (left bundle branch block)    Multiple facial fractures (HCC) 02/25/2016   Multiple rib fractures 09/25/10   PTX 09/2010   Nephrolithiasis    Renal stone basketing.   Pure hypercholesterolemia    On Red Yeast Rice & CoQ 10.   Sinoatrial node dysfunction (HCC)    EC Holter 24hr 06/24/13 showed sinus bradycardia with probable chronotropic incompetence. May need pacemaker per Dr. Victory Sharps.   Skin cancer    Left base (Face) & (Ear in 12/2011).    Assessment: Patient Reported Symptoms:  Cognitive Cognitive Status: Able to follow simple commands, Alert and oriented to person, place, and time, Normal speech and language skills Cognitive/Intellectual Conditions Management [RPT]: None reported or documented in medical history or problem list   Health Maintenance Behaviors: Annual physical exam, Exercise  Neurological Neurological Review of Symptoms: No symptoms reported    HEENT HEENT Symptoms Reported: No symptoms reported HEENT Management Strategies: Routine screening, Medical device    Cardiovascular  Cardiovascular Symptoms Reported: No symptoms reported Does patient have uncontrolled Hypertension?: No (Confirmed patient does have BP cuff at home) Cardiovascular Management Strategies: Medical device, Medication therapy, Routine screening, Exercise, Weight management (Pacemaker) Do You Have a Working Readable Scale?: Yes Weight: 150 lb (68 kg) (Patient reported)  Respiratory Respiratory Symptoms Reported: Not assesed    Endocrine Endocrine Symptoms Reported: No symptoms reported Is patient diabetic?: No    Gastrointestinal Gastrointestinal Symptoms Reported: No symptoms reported Additional Gastrointestinal Details: Patient reports a good appetite      Genitourinary Genitourinary Symptoms Reported: Not assessed    Integumentary Integumentary Symptoms Reported: Not assessed    Musculoskeletal Musculoskelatal Symptoms Reviewed: Unsteady gait Musculoskeletal Management Strategies: Adequate rest, Coping strategies, Routine screening Musculoskeletal Comment: Patient reports he is very active, working in his yard daily Falls in the past year?: Yes Number of falls in past year: 2 or more Was there an injury with Fall?: No Fall Risk Category Calculator: 2 Patient Fall Risk Level: Moderate Fall Risk Patient at Risk for Falls Due to: History of fall(s), Impaired balance/gait Fall risk Follow up: Falls evaluation completed, Education provided, Falls prevention discussed  Psychosocial Psychosocial Symptoms Reported: Not assessed     Quality of Family Relationships: helpful, involved, supportive Do you feel physically threatened by others?: No    07/25/2024    PHQ2-9 Depression Screening   Little interest or pleasure in doing things    Feeling down, depressed, or hopeless    PHQ-2 - Total Score    Trouble falling or staying asleep, or  sleeping too much    Feeling tired or having little energy    Poor appetite or overeating     Feeling bad about yourself - or that you are a failure or  have let yourself or your family down    Trouble concentrating on things, such as reading the newspaper or watching television    Moving or speaking so slowly that other people could have noticed.  Or the opposite - being so fidgety or restless that you have been moving around a lot more than usual    Thoughts that you would be better off dead, or hurting yourself in some way    PHQ2-9 Total Score    If you checked off any problems, how difficult have these problems made it for you to do your work, take care of things at home, or get along with other people    Depression Interventions/Treatment      There were no vitals filed for this visit.  Medications Reviewed Today     Reviewed by Arno Rosaline SQUIBB, RN (Registered Nurse) on 07/25/24 at 1132  Med List Status: <None>   Medication Order Taking? Sig Documenting Provider Last Dose Status Informant  acetaminophen  (TYLENOL ) 325 MG tablet [id number] Yes Take 2 tablets (650 mg total) by mouth every 4 (four) hours as needed for mild pain. Nancye Libel, NP  Active Multiple Informants  amoxicillin (AMOXIL) 500 MG capsule [id number] Yes Take 4 capsules by mouth as needed 1 hour prior to dental procedures. [provider]  Active Multiple Informants  apixaban  (ELIQUIS ) 2.5 MG TABS tablet [id number] Yes Take 1 tablet (2.5 mg total) by mouth 2 (two) times daily. Lelon Hamilton T, PA-C  Active   co-enzyme Q-10 30 MG capsule [id number] Yes Take 30 mg by mouth daily. [provider]  Active Multiple Informants  furosemide  (LASIX ) 20 MG tablet [id number] Yes Take 1 tablet (20 mg total) by mouth daily. Waddell Danelle ORN, MD  Active   JARDIANCE  10 MG TABS tablet [id number] Yes TAKE ONE TABLET BY MOUTH DAILY BEFORE BREAKFAST Waddell Danelle ORN, MD  Active   metoprolol  succinate (TOPROL -XL) 25 MG 24 hr tablet [id number] Yes Take 1 tablet (25 mg total) by mouth daily. Patwardhan, Newman PARAS, MD  Active   nitroGLYCERIN  (NITROSTAT ) 0.4 MG SL tablet [id number]  Yes Place 0.4 mg under the tongue every 5 (five) minutes as needed for chest pain. (MAXIMUM of 3 TABLETS) [provider]  Active Multiple Informants  OVER THE COUNTER MEDICATION [id number] Yes Take 2 capsules by mouth daily. OTC [place] capsules [provider]  Active Multiple Informants  OVER THE COUNTER MEDICATION [id number] Yes Take 1 capsule by mouth daily. OMEGA Q PLUS [provider]  Active Multiple Informants  phenylephrine (SUDAFED PE) 10 MG TABS tablet [id number] Yes Take 10 mg by mouth every 6 (six) hours as needed (nasal congestion). [provider]  Active Multiple Informants  Red Yeast Rice 600 MG CAPS [id number] Yes Take 600 mg by mouth daily. [provider]  Active Multiple Informants  sacubitril-valsartan (ENTRESTO ) 24-26 MG [id number] Yes Take 1 tablet by mouth 2 (two) times daily. Patwardhan, Newman PARAS, MD  Active   sodium chloride  (OCEAN) 0.65 % SOLN nasal spray [id number] Yes Place 1 spray into both nostrils daily as needed for congestion. [provider]  Active Multiple Informants  temazepam  (RESTORIL ) 30 MG capsule [id number] Yes Take 30 mg by mouth at bedtime. [provider]  Active Multiple Informants  Med Note ODETTA, MARIAN A   Mon Oct 09, 2017 10:20 AM)    Throat Lozenges [facility] FRIEND MT) [id number] Yes Use as directed 1 lozenge in the mouth or throat 2 (two) times daily as needed (congestion). [provider]  Active Multiple Informants            Recommendation:   Specialty provider follow-up cardiology 08/01/24 Continue Current Plan of Care  Follow Up Plan:   Telephone follow up appointment date/time:  08/01/24 at 11 AM  Rosaline Finlay, RN MSN [place]  [facility] RN Care Manager Direct Dial: [phone]  Fax: [phone]

## 2024-07-25 NOTE — Telephone Encounter (Signed)
 T of Dr. [place]. Please advise on this question on Eliquis .

## 2024-07-26 NOTE — Telephone Encounter (Signed)
 Spoke with pharmacist at [facility] regarding Eliquis . Pharmacist notified of Glendia Ferrier, PA-C's response. Pharmacist to fill Eliquis  2.5 mg twice daily. Pharmacist to notify pt.

## 2024-08-01 ENCOUNTER — Ambulatory Visit: Attending: Internal Medicine | Admitting: Internal Medicine

## 2024-08-01 ENCOUNTER — Telehealth: Payer: Self-pay

## 2024-08-01 ENCOUNTER — Encounter: Payer: Self-pay | Admitting: Internal Medicine

## 2024-08-01 VITALS — BP 124/62 | HR 64 | Ht 67.0 in | Wt 150.6 lb

## 2024-08-01 DIAGNOSIS — I442 Atrioventricular block, complete: Secondary | ICD-10-CM

## 2024-08-01 LAB — CUP PACEART INCLINIC DEVICE CHECK
Date Time Interrogation Session: 20251016170442
Implantable Lead Connection Status: 753985
Implantable Lead Connection Status: 753985
Implantable Lead Implant Date: 20230911
Implantable Lead Implant Date: 20230911
Implantable Lead Location: 753859
Implantable Lead Location: 753860
Implantable Lead Model: 7840
Implantable Lead Model: 7841
Implantable Lead Serial Number: 104690
Implantable Lead Serial Number: 1094065
Implantable Pulse Generator Implant Date: 20230911
Lead Channel Impedance Value: 562 Ohm
Lead Channel Impedance Value: 635 Ohm
Lead Channel Pacing Threshold Amplitude: 0.6 V
Lead Channel Pacing Threshold Amplitude: 1.1 V
Lead Channel Pacing Threshold Pulse Width: 0.4 ms
Lead Channel Pacing Threshold Pulse Width: 0.4 ms
Lead Channel Sensing Intrinsic Amplitude: 3.2 mV
Lead Channel Setting Pacing Amplitude: 2.5 V
Lead Channel Setting Pacing Amplitude: 2.5 V
Lead Channel Setting Pacing Pulse Width: 0.4 ms
Lead Channel Setting Sensing Sensitivity: 2.5 mV
Pulse Gen Serial Number: 689048
Zone Setting Status: 755011

## 2024-08-01 NOTE — Progress Notes (Signed)
 HPI Brandon Robles returns today for followup of his bradycardia. He is a pleasant elderly man with a h/o HTN, high grade heart block, and sinus bradycardia. He remains active. He has not had syncope. 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 He denies anginal symptoms or sob. No syncope. He has developed worsening atrial fib/flutter.  Allergies  Allergen Reactions   Other Other (See Comments)    pins and needles feeling   Vitamin D Analogs Other (See Comments)    pins and needles feeling   Zolpidem Other (See Comments)    Confusion, hallucinations   Fish Oil Other (See Comments)   Spironolactone  Other (See Comments)    Hyperkalemia   Ambien [Zolpidem Tartrate] Other (See Comments)    Confusion   Honey Other (See Comments)    pins and needles feeling     Current Outpatient Medications  Medication Sig Dispense Refill   acetaminophen  (TYLENOL ) 325 MG tablet Take 2 tablets (650 mg total) by mouth every 4 (four) hours as needed for mild pain.     amoxicillin (AMOXIL) 500 MG capsule Take 4 capsules by mouth as needed 1 hour prior to dental procedures.  0   apixaban  (ELIQUIS ) 2.5 MG TABS tablet Take 1 tablet (2.5 mg total) by mouth 2 (two) times daily. 180 tablet 1   co-enzyme Q-10 30 MG capsule Take 30 mg by mouth daily.     furosemide  (LASIX ) 20 MG tablet Take 1 tablet (20 mg total) by mouth daily. 90 tablet 3   JARDIANCE  10 MG TABS tablet TAKE ONE TABLET BY MOUTH DAILY BEFORE BREAKFAST 90 tablet 3   metoprolol  succinate (TOPROL -XL) 25 MG 24 hr tablet Take 1 tablet (25 mg total) by mouth daily. 90 tablet 3   nitroGLYCERIN  (NITROSTAT ) 0.4 MG SL tablet Place 0.4 mg under the tongue every 5 (five) minutes as needed for chest pain. (MAXIMUM of 3 TABLETS)     OVER THE COUNTER MEDICATION Take 2 capsules by mouth daily. OTC [place] capsules     OVER THE COUNTER MEDICATION Take 1 capsule by mouth daily. OMEGA Q PLUS     phenylephrine (SUDAFED PE) 10 MG TABS  tablet Take 10 mg by mouth every 6 (six) hours as needed (nasal congestion).     Red Yeast Rice 600 MG CAPS Take 600 mg by mouth daily.     sacubitril-valsartan (ENTRESTO ) 24-26 MG Take 1 tablet by mouth 2 (two) times daily. 180 tablet 3   sodium chloride  (OCEAN) 0.65 % SOLN nasal spray Place 1 spray into both nostrils daily as needed for congestion.     temazepam  (RESTORIL ) 30 MG capsule Take 30 mg by mouth at bedtime.     Throat Lozenges [facility] FRIEND MT) Use as directed 1 lozenge in the mouth or throat 2 (two) times daily as needed (congestion).     No current facility-administered medications for this visit.     Past Medical History:  Diagnosis Date   Blastocystis hominis    Bradycardia 07/18/2013   CAD (coronary artery disease)    S/P inferior MI 07/2000 with RCA stenting at that time. In 03/2006 he had a LAD non-drug-eluting stent implanted.   Chronic insomnia    Complex renal cyst    Left   Diverticulitis    ED (erectile dysfunction)    Vacuum pump   Essential hypertension, benign    Stable, on lisinopril -HCTZ & Atenolol.   GERD (gastroesophageal reflux disease)  H/O pericarditis    Hemorrhoids    LBBB (left bundle branch block)    Multiple facial fractures (HCC) 02/25/2016   Multiple rib fractures 09/25/10   PTX 09/2010   Nephrolithiasis    Renal stone basketing.   Pure hypercholesterolemia    On Red Yeast Rice & CoQ 10.   Sinoatrial node dysfunction (HCC)    EC Holter 24hr 06/24/13 showed sinus bradycardia with probable chronotropic incompetence. May need pacemaker per Dr. Victory Sharps.   Skin cancer    Left base (Face) & (Ear in 12/2011).    ROS:   All systems reviewed and negative except as noted in the HPI.   Past Surgical History:  Procedure Laterality Date   CORONARY ANGIOPLASTY WITH STENT PLACEMENT  07/25/00   MI in 07/25/00 with RCA stenting at that time.   CORONARY ANGIOPLASTY WITH STENT PLACEMENT  04/14/06   Bare metal stent in proximal LAD [facility] - 3.5 x16 stent) by Dr. HILARIO Sharps.   PROSTATE BIOPSY     Evidence   RIGHT THORACOTOMY,OPEN REDUCTION AND INTERNAL FIXATION OF RIBS 4-7  09/25/2010   STONE EXTRACTION WITH BASKET     Renal     Family History  Problem Relation Age of Onset   CVA Mother    Heart attack Father    Kidney failure Father    Heart disease Sister        Possible heart disease   Aneurysm Sister    Hypertension Brother    Heart disease Brother    Heart failure Brother    Lung disease Brother    Diabetes Brother      Social History   Socioeconomic History   Marital status: Single    Spouse name: Not on file   Number of children: Not on file   Years of education: Not on file   Highest education level: Not on file  Occupational History   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: RETIRED  Tobacco Use   Smoking status: Former    Types: Cigars   Smokeless tobacco: Never   Tobacco comments:    quit 1990  Vaping Use   Vaping status: Never Used  Substance and Sexual Activity   Alcohol use: Yes    Comment: NOT ON A REGULAR BASIS   Drug use: No   Sexual activity: Not on file  Other Topics Concern   Not on file  Social History Narrative   Not on file   Social Drivers of Health   Financial Resource Strain: Not on file  Food Insecurity: No Food Insecurity (07/25/2024)   Hunger Vital Sign    Worried About Running Out of Food in the Last Year: Never true    Ran Out of Food in the Last Year: Never true  Transportation Needs: No Transportation Needs (07/25/2024)   PRAPARE - Administrator, Civil Service (Medical): No    Lack of Transportation (Non-Medical): No  Physical Activity: Not on file  Stress: Not on file  Social Connections: Not on file  Intimate Partner Violence: Not At Risk (07/25/2024)   Humiliation, Afraid, Rape, and Kick questionnaire    Fear of Current or Ex-Partner: No    Emotionally Abused: No    Physically Abused: No    Sexually Abused: No     BP 124/62  (BP Location: Left Arm, Patient Position: Sitting, Cuff Size: Normal)   Pulse 64   Ht 5' 7 (1.702 m)   Wt 150 lb  9.6 oz (68.3 kg)   SpO2 99%   BMI 23.59 kg/m   Physical Exam:  Well appearing elderly man, NAD HEENT: Unremarkable Neck:  No JVD, no thyromegally Lymphatics:  No adenopathy Back:  No CVA tenderness Lungs:  Clear with no wheezes HEART:  Regular rate rhythm, no murmurs, no rubs, no clicks Abd:  soft, positive bowel sounds, no organomegally, no rebound, no guarding Ext:  2 plus pulses, no edema, no cyanosis, no clubbing Skin:  No rashes no nodules Neuro:  CN II through XII intact, motor grossly intact  DEVICE  Normal device function.  See PaceArt for details.   Assess/Plan:  Atrial fib/flutter - This appears to be worsening. I have recommended he continue eliquis . He will continue his other meds. Chronic diastolic heart failure - he will continue his current meds. HTN - I suspect that this is related to his atrial fib/flutter. CHB - his [employer] PPM is working normally.   Danelle Kynzie Polgar,MD

## 2024-08-01 NOTE — Patient Instructions (Signed)

## 2024-08-01 NOTE — Patient Outreach (Signed)
 Care Coordination   08/01/2024 Name: Brandon Robles MRN: [id number] DOB: [birth year]   Care Coordination Outreach Attempts:  An unsuccessful outreach was attempted for an appointment today.  Follow Up Plan:  Additional outreach attempts will be made to complete CCM follow-up visit.   Encounter Outcome:  No Answer. Message left requesting return call.   Rosaline Finlay, RN MSN [place]  [facility] Population Health RN Care Manager Direct Dial: [phone]  Fax: [phone]

## 2024-08-01 NOTE — Patient Instructions (Signed)
 Odis LOISE Sharps - I am sorry I was unable to reach you today for our scheduled appointment. I work with Varadarajan, Rupashree, MD and am calling to support your healthcare needs. Please contact me at [phone] at your earliest convenience. I look forward to speaking with you soon.   Thank you,  Rosaline Finlay, RN MSN [place]  [facility] RN Care Manager Direct Dial: [phone]  Fax: [phone]

## 2024-08-06 NOTE — Patient Outreach (Signed)
 Care Coordination   08/06/2024 Name: Brandon Robles MRN: [id number] DOB: Jul 28, 1926   Care Coordination Outreach Attempts:  A second unsuccessful outreach was attempted today to complete CCM follow-up visit.  Follow Up Plan:  Additional outreach attempts will be made to complete follow-up visit.   Encounter Outcome:  No Answer. HIPAA complaint voicemail left requesting return call.   Rosaline Finlay, RN MSN [place]  [facility] Population Health RN Care Manager Direct Dial: [phone]  Fax: [phone]

## 2024-08-07 NOTE — Patient Instructions (Signed)
 Brandon Robles - I have attempted to call you three times but have been unsuccessful in reaching you. I work with Varadarajan, Rupashree, MD and am calling to support your healthcare needs. If I can be of assistance to you, please contact me at [phone].     Thank you,  Rosaline Finlay, RN MSN [place]  [facility] RN Care Manager Direct Dial: [phone]  Fax: [phone]

## 2024-08-07 NOTE — Patient Outreach (Signed)
 Care Coordination   08/07/2024 Name: Brandon Robles MRN: [id number] DOB: July 28, 1926   Care Coordination Outreach Attempts: A third unsuccessful outreach was attempted today to complete CCM follow-up visit.   Follow Up Plan:  No further outreach attempts will be made at this time. We have been unable to contact the patient to complete follow-up visit. Patient will be disenrolled from CCM.  Encounter Outcome:  No Answer. HIPAA compliant voicemail left requesting return call. Message sent to patient via MyChart. Chart routed to PCP to make aware of 3 unsuccessful attempts and program closure.   Rosaline Finlay, RN MSN [place]  [facility] RN Care Manager Direct Dial: [phone]  Fax: [phone]

## 2024-09-24 DIAGNOSIS — K080 Exfoliation of teeth due to systemic causes: Secondary | ICD-10-CM | POA: Diagnosis not present

## 2024-10-14 ENCOUNTER — Other Ambulatory Visit: Payer: Self-pay | Admitting: Internal Medicine

## 2024-10-14 ENCOUNTER — Other Ambulatory Visit: Payer: Self-pay | Admitting: Physician Assistant

## 2024-10-14 NOTE — Telephone Encounter (Signed)
 Prescription refill request for Eliquis  received. Indication:afib Last office visit:10/25 Scr: 1.57  9/25 Age:89 Weight:68.3  kg  Prescription refilled

## 2024-10-18 ENCOUNTER — Ambulatory Visit: Attending: Cardiology

## 2024-10-18 DIAGNOSIS — I480 Paroxysmal atrial fibrillation: Secondary | ICD-10-CM | POA: Diagnosis not present

## 2024-10-20 LAB — CUP PACEART REMOTE DEVICE CHECK
Battery Remaining Longevity: 72 mo
Battery Remaining Percentage: 100 %
Brady Statistic RA Percent Paced: 7 %
Brady Statistic RV Percent Paced: 98 %
Date Time Interrogation Session: 20260102012200
Implantable Lead Connection Status: 753985
Implantable Lead Connection Status: 753985
Implantable Lead Implant Date: 20230911
Implantable Lead Implant Date: 20230911
Implantable Lead Location: 753859
Implantable Lead Location: 753860
Implantable Lead Model: 7840
Implantable Lead Model: 7841
Implantable Lead Serial Number: 104690
Implantable Lead Serial Number: 1094065
Implantable Pulse Generator Implant Date: 20230911
Lead Channel Impedance Value: 532 Ohm
Lead Channel Impedance Value: 583 Ohm
Lead Channel Setting Pacing Amplitude: 2.5 V
Lead Channel Setting Pacing Amplitude: 2.5 V
Lead Channel Setting Pacing Pulse Width: 0.4 ms
Lead Channel Setting Sensing Sensitivity: 2.5 mV
Pulse Gen Serial Number: 689048
Zone Setting Status: 755011

## 2024-10-21 ENCOUNTER — Ambulatory Visit: Payer: Self-pay | Admitting: Cardiology

## 2024-10-24 NOTE — Progress Notes (Signed)
 Remote PPM Transmission

## 2024-11-19 ENCOUNTER — Encounter: Payer: Self-pay | Admitting: Cardiology

## 2025-01-17 ENCOUNTER — Ambulatory Visit

## 2025-02-03 ENCOUNTER — Ambulatory Visit: Admitting: Cardiology
